# Patient Record
Sex: Female | Born: 1945 | Race: White | Hispanic: No | Smoking: Never smoker
Health system: Southern US, Community
[De-identification: ages and names within clinical notes are randomized; demographics above are authoritative.]

## PROBLEM LIST (undated history)

## (undated) DIAGNOSIS — R030 Elevated blood-pressure reading, without diagnosis of hypertension: Secondary | ICD-10-CM

## (undated) DIAGNOSIS — R188 Other ascites: Secondary | ICD-10-CM

## (undated) DIAGNOSIS — C649 Malignant neoplasm of unspecified kidney, except renal pelvis: Secondary | ICD-10-CM

## (undated) DIAGNOSIS — E119 Type 2 diabetes mellitus without complications: Secondary | ICD-10-CM

## (undated) DIAGNOSIS — I10 Essential (primary) hypertension: Secondary | ICD-10-CM

## (undated) DIAGNOSIS — R160 Hepatomegaly, not elsewhere classified: Secondary | ICD-10-CM

## (undated) DIAGNOSIS — N63 Unspecified lump in unspecified breast: Secondary | ICD-10-CM

## (undated) HISTORY — DX: Elevated blood-pressure reading, without diagnosis of hypertension: R03.0

## (undated) HISTORY — DX: Unspecified lump in unspecified breast: N63.0

## (undated) HISTORY — DX: Malignant neoplasm of unspecified kidney, except renal pelvis: C64.9

## (undated) HISTORY — PX: BREAST LUMPECTOMY: SHX2

## (undated) HISTORY — DX: Type 2 diabetes mellitus without complications: E11.9

---

## 1994-06-05 HISTORY — PX: BREAST BIOPSY: SHX20

## 2004-04-02 LAB — HM PAP SMEAR: HM Pap smear: NORMAL

## 2004-10-11 ENCOUNTER — Ambulatory Visit: Payer: Self-pay | Admitting: Obstetrics and Gynecology

## 2004-11-22 ENCOUNTER — Ambulatory Visit: Payer: Self-pay | Admitting: Obstetrics and Gynecology

## 2006-01-09 DIAGNOSIS — R188 Other ascites: Secondary | ICD-10-CM

## 2006-01-09 HISTORY — DX: Other ascites: R18.8

## 2009-06-05 HISTORY — PX: SHOULDER SURGERY: SHX246

## 2010-11-21 ENCOUNTER — Ambulatory Visit: Payer: Self-pay | Admitting: Orthopedic Surgery

## 2010-11-21 ENCOUNTER — Emergency Department: Payer: Self-pay | Admitting: Emergency Medicine

## 2010-11-29 ENCOUNTER — Ambulatory Visit: Payer: Self-pay | Admitting: Orthopedic Surgery

## 2010-12-02 ENCOUNTER — Ambulatory Visit: Payer: Self-pay | Admitting: Orthopedic Surgery

## 2011-01-12 ENCOUNTER — Encounter: Payer: Self-pay | Admitting: Orthopedic Surgery

## 2011-02-04 ENCOUNTER — Encounter: Payer: Self-pay | Admitting: Orthopedic Surgery

## 2011-03-06 ENCOUNTER — Encounter: Payer: Self-pay | Admitting: Orthopedic Surgery

## 2011-04-06 ENCOUNTER — Encounter: Payer: Self-pay | Admitting: Orthopedic Surgery

## 2011-05-06 ENCOUNTER — Encounter: Payer: Self-pay | Admitting: Orthopedic Surgery

## 2011-06-06 ENCOUNTER — Encounter: Payer: Self-pay | Admitting: Orthopedic Surgery

## 2011-07-07 ENCOUNTER — Encounter: Payer: Self-pay | Admitting: Orthopedic Surgery

## 2012-01-12 ENCOUNTER — Other Ambulatory Visit: Payer: Self-pay | Admitting: Physician Assistant

## 2012-01-12 LAB — COMPREHENSIVE METABOLIC PANEL
Anion Gap: 5 — ABNORMAL LOW (ref 7–16)
BUN: 10 mg/dL (ref 7–18)
Calcium, Total: 9 mg/dL (ref 8.5–10.1)
Chloride: 108 mmol/L — ABNORMAL HIGH (ref 98–107)
Co2: 30 mmol/L (ref 21–32)
EGFR (African American): >60
EGFR (Non-African Amer.): >60
Osmolality: 286 (ref 275–301)
Potassium: 4.4 mmol/L (ref 3.5–5.1)
SGOT(AST): 42 U/L — ABNORMAL HIGH (ref 15–37)
Sodium: 143 mmol/L (ref 136–145)
Total Protein: 7.1 g/dL (ref 6.4–8.2)

## 2012-01-12 LAB — CBC WITH DIFFERENTIAL/PLATELET
Basophil #: 0 10*3/uL (ref 0.0–0.1)
Basophil %: 0.9 %
Eosinophil #: 0.1 10*3/uL (ref 0.0–0.7)
HCT: 43.8 % (ref 35.0–47.0)
Lymphocyte %: 34.8 %
MCH: 29.1 pg (ref 26.0–34.0)
MCHC: 33.7 g/dL (ref 32.0–36.0)
MCV: 86 fL (ref 80–100)
Monocyte #: 0.5 x10 3/mm (ref 0.2–0.9)
Monocyte %: 9.9 %
Neutrophil #: 2.8 10*3/uL (ref 1.4–6.5)
Neutrophil %: 51.8 %
RBC: 5.07 10*6/uL (ref 3.80–5.20)

## 2012-01-12 LAB — LIPID PANEL
Cholesterol: 156 mg/dL (ref 0–200)
HDL Cholesterol: 42 mg/dL (ref 40–60)
VLDL Cholesterol, Calc: 22 mg/dL (ref 5–40)

## 2012-01-12 LAB — HEMOGLOBIN A1C: Hemoglobin A1C: 7 % — ABNORMAL HIGH

## 2012-01-12 LAB — TSH: Thyroid Stimulating Horm: 2.37 u[IU]/mL

## 2012-01-29 ENCOUNTER — Ambulatory Visit: Payer: Self-pay | Admitting: Physician Assistant

## 2012-02-04 ENCOUNTER — Ambulatory Visit: Payer: Self-pay | Admitting: Physician Assistant

## 2012-03-03 LAB — HM DIABETES EYE EXAM: HM Diabetic Eye Exam: NORMAL

## 2012-03-05 ENCOUNTER — Ambulatory Visit: Payer: Self-pay | Admitting: Physician Assistant

## 2012-04-02 ENCOUNTER — Ambulatory Visit (INDEPENDENT_AMBULATORY_CARE_PROVIDER_SITE_OTHER): Payer: PRIVATE HEALTH INSURANCE | Admitting: Internal Medicine

## 2012-04-02 ENCOUNTER — Encounter: Payer: Self-pay | Admitting: Internal Medicine

## 2012-04-02 VITALS — BP 144/74 | HR 85 | Temp 97.8°F | Ht 64.5 in | Wt 243.8 lb

## 2012-04-02 DIAGNOSIS — IMO0001 Reserved for inherently not codable concepts without codable children: Secondary | ICD-10-CM

## 2012-04-02 DIAGNOSIS — Z1239 Encounter for other screening for malignant neoplasm of breast: Secondary | ICD-10-CM

## 2012-04-02 DIAGNOSIS — Z1211 Encounter for screening for malignant neoplasm of colon: Secondary | ICD-10-CM

## 2012-04-02 DIAGNOSIS — E669 Obesity, unspecified: Secondary | ICD-10-CM

## 2012-04-02 DIAGNOSIS — E119 Type 2 diabetes mellitus without complications: Secondary | ICD-10-CM

## 2012-04-02 DIAGNOSIS — E1169 Type 2 diabetes mellitus with other specified complication: Secondary | ICD-10-CM | POA: Insufficient documentation

## 2012-04-02 DIAGNOSIS — I10 Essential (primary) hypertension: Secondary | ICD-10-CM

## 2012-04-02 NOTE — Progress Notes (Signed)
Patient ID: Jennifer Ruiz, female   DOB: 04-10-1946, 66 y.o.   MRN: [id number]  Patient Active Problem List  Diagnosis  . Diabetes mellitus type 2 in obese  . Diabetes mellitus without complication  . Obesity (BMI 30-39.9)  . Colon cancer screening  . White coat hypertension    Subjective:  CC:   No chief complaint on file.   HPI:   Jennifer Ruiz is a 66 y.o. female who presents as a new patient to establish primary care with the chief complaint of  New onset Diabetes, diagnosed with hgba1c of 7.0 in August after3 months of binging on sweet rolls and other starches. She has been going to the diabetes lifestyle clinic. Repeat A1c is due next month.  Checks cbgs every other day . Fastings have been in the diabetes diagnostic range from 113-164. Her postprandials have been ranging from 119-154. She is using a modified low carbohydrate diet. She is not exercising regularly yet.   Past Medical History  Diagnosis Date  . Elevated blood pressure reading   . Diabetes mellitus without complication     Past Surgical History  Procedure Date  . Breast biopsy 1996  . Shoulder surgery 2011    right , [place]   . Breast lumpectomy     Family History  Problem Relation Age of Onset  . Cancer Mother 43    Breast Cancer  . Asthma Father   . Diabetes Father     History   Social History  . Marital Status: Divorced    Spouse Name: N/A    Number of Children: N/A  . Years of Education: N/A   Occupational History  . Not on file.   Social History Main Topics  . Smoking status: Never Smoker   . Smokeless tobacco: Not on file  . Alcohol Use: No  . Drug Use: Not on file  . Sexually Active: Not on file   Other Topics Concern  . Not on file   Social History Narrative  . No narrative on file    No Known Allergies   Review of Systems:   The remainder of the review of systems was negative except those addressed in the HPI.       Objective:  BP 144/74  Pulse 85  Temp 97.8  F (36.6 C) (Oral)  Ht 5' 4.5" (1.638 m)  Wt 243 lb 12 oz (110.564 kg)  BMI 41.19 kg/m2  SpO2 97%  General appearance: alert, cooperative and appears stated age Ears: normal TM's and external ear canals both ears Throat: lips, mucosa, and tongue normal; teeth and gums normal Neck: no adenopathy, no carotid bruit, supple, symmetrical, trachea midline and thyroid not enlarged, symmetric, no tenderness/mass/nodules Back: symmetric, no curvature. ROM normal. No CVA tenderness. Lungs: clear to auscultation bilaterally Heart: regular rate and rhythm, S1, S2 normal, no murmur, click, rub or gallop Abdomen: soft, non-tender; bowel sounds normal; no masses,  no organomegaly Pulses: 2+ and symmetric Skin: Skin color, texture, turgor normal. No rashes or lesions Lymph nodes: Cervical, supraclavicular, and axillary nodes normal.  Assessment and Plan:  Diabetes mellitus type 2 in obese Agree with patient that if there is no need for medications at this point given her recent hemoglobin A1c is 7.0 and her recent dietary changes. Spent 20 minutes today reiewing diet and discussing low carbohydrate alternatives to some foods she is eating.  Obesity (BMI 30-39.9) I have addressed  BMI and recommended a low glycemic index diet utilizing smaller more frequent  meals to increase metabolism.  I have also recommended that patient start exercising with a goal of 30 minutes of aerobic exercise a minimum of 5 days per week.   Colon cancer screening She intentionally avoided a colonoscopy since age 30 on despite having this available as to [employer] [facility] employee. We discussed alternatives including annual fecal occult blood test. She will decide her at her next visit which will be her annual physical.  White coat hypertension She has been taking lisinopril 10 mg daily. Her renal function is normal as of August 2013. Creatinine 0.78 and electrolytes normal. Her outside readings have been ranging from a systolic of 120-140  end diastolic from 60-70. No changes today.   Updated Medication List Outpatient Encounter Prescriptions as of 04/02/2012  Medication Sig Dispense Refill  . lisinopril (PRINIVIL,ZESTRIL) 10 MG tablet Take 10 mg by mouth daily.      . Vitamin D, Ergocalciferol, (DRISDOL) 50000 UNITS CAPS Take 50,000 Units by mouth every 7 (seven) days.         Orders Placed This Encounter  Procedures  . MM Digital Screening  . HM MAMMOGRAPHY  . HM PAP SMEAR  . HM DIABETES EYE EXAM    No Follow-up on file.

## 2012-04-02 NOTE — Patient Instructions (Addendum)
We will repeat your hepatic panel (liver tests) in one month with your A1c    This is  Dr. Norton Blizzard version of a  "Low GI"  Diet:  All of the foods can be found at grocery stores and in bulk at [employer].  The Atkins protein bars and shakes are available in more varieties at Target, WalMart and [employer]'s Foods.     7 AM Breakfast:  Low carbohydrate Protein  Shakes (I recommend the EAS AdvantEdge "Carb Control" shakes  Or the low carb shakes by Atkins.   Both are available everywhere:  In  cases at BJs  Or in 4 packs at grocery stores and pharmacies  2.5 carbs  (Alternative is  a toasted Arnold's Sandwhich Thin w/ peanut butter, a "Bagel Thin" with cream cheese and salmon) or  a scrambled egg burrito made with a low carb tortilla .  Avoid cereal and bananas, oatmeal too unless you are cooking the old fashioned kind that takes 30-40 minutes to prepare.  the rest is overly processed, has minimal fiber, and is loaded with carbohydrates!   10 AM: Protein bar by Atkins (the snack size, under 200 cal).  There are many varieties , available widely again or in bulk in limited varieties at BJs)  Other so called "protein bars" tend to be loaded with carbohydrates.  Remember, in food advertising, the word "energy" is synonymous for " carbohydrate."  Lunch: sandwich of [place], (or any lunchmeat, grilled meat or canned tuna), fresh avocado, mayonnaise  and cheese on a lower carbohydrate pita bread, flatbread, or tortilla . Ok to use regular mayonnaise. The bread is the only source or carbohydrate that can be decreased (Joseph's makes a pita bread and a flat bread that are 50 cal and 4 net carbs ; Toufayan makes a low carb flatbread that's 100 cal and 9 net carbs  and  Mission makes a low carb whole wheat tortilla  That is 210 cal and 6 net carbs)  3 PM:  Mid day :  Another protein bar,  Or a  cheese stick (100 cal, 0 carbs),  Or 1 ounce of  almonds, walnuts, pistachios, pecans, peanuts,  Macadamia nuts. Or a Dannon  light n Fit greek yogurt, 80 cal 8 net carbs . Avoid "granola"; the dried cranberries and raisins are loaded with carbohydrates. Mixed nuts ok if no raisins or cranberries or dried fruit.      6 PM  Dinner:  "mean and green:"  Meat/chicken/fish or a high protein legume; , with a green salad, and a low GI  Veggie (broccoli, cauliflower, green beans, spinach, brussel sprouts. Lima beans) : Avoid "Low fat dressings, as well as Reyne Dumas and [address]! They are loaded with sugar! Instead use ranch, vinagrette,  Blue cheese, etc  9 PM snack : Breyer's "low carb" fudgsicle or  ice cream bar (Carb Smart line), or  Weight Watcher's ice cream bar , or another "no sugar added" ice cream;a serving of fresh berries/cherries with whipped cream (Avoid bananas, pineapple, grapes  and watermelon on a regular basis because they are high in sugar)   Remember that snack Substitutions should be less than 15 to 20 carbs  Per serving. Remember to subtract fiber grams and sugar alcohols to get the "net carbs."

## 2012-04-03 DIAGNOSIS — Z860100 Personal history of colon polyps, unspecified: Secondary | ICD-10-CM | POA: Insufficient documentation

## 2012-04-03 DIAGNOSIS — IMO0001 Reserved for inherently not codable concepts without codable children: Secondary | ICD-10-CM | POA: Insufficient documentation

## 2012-04-03 NOTE — Assessment & Plan Note (Signed)
I have addressed  BMI and recommended a low glycemic index diet utilizing smaller more frequent meals to increase metabolism.  I have also recommended that patient start exercising with a goal of 30 minutes of aerobic exercise a minimum of 5 days per week.  

## 2012-04-03 NOTE — Assessment & Plan Note (Signed)
She intentionally avoided a colonoscopy since age 66 on despite having this available as to [facility] employee. We discussed alternatives including annual fecal occult blood test. She will decide her at her next visit which will be her annual physical.

## 2012-04-03 NOTE — Assessment & Plan Note (Signed)
She has been taking lisinopril 10 mg daily. Her renal function is normal as of August 2013. Creatinine 0.78 and electrolytes normal. Her outside readings have been ranging from a systolic of 120-140 end diastolic from 60-70. No changes today.

## 2012-04-03 NOTE — Assessment & Plan Note (Signed)
Agree with patient that if there is no need for medications at this point given her recent hemoglobin A1c is 7.0 and her recent dietary changes. Spent 20 minutes today reiewing diet and discussing low carbohydrate alternatives to some foods she is eating.

## 2012-04-26 ENCOUNTER — Other Ambulatory Visit: Payer: Self-pay | Admitting: Internal Medicine

## 2012-04-26 LAB — HEPATIC FUNCTION PANEL A (ARMC)
Bilirubin, Direct: 0.2 mg/dL (ref 0.00–0.20)
Bilirubin,Total: 0.6 mg/dL (ref 0.2–1.0)
SGOT(AST): 29 U/L (ref 15–37)
SGPT (ALT): 33 U/L (ref 12–78)
Total Protein: 7.1 g/dL (ref 6.4–8.2)

## 2012-04-28 ENCOUNTER — Telehealth: Payer: Self-pay | Admitting: Internal Medicine

## 2012-04-28 NOTE — Telephone Encounter (Signed)
Her repeat liver tests were normal.

## 2012-04-30 NOTE — Telephone Encounter (Signed)
Patient notified of normal lab results.

## 2012-05-06 ENCOUNTER — Encounter: Payer: Self-pay | Admitting: Internal Medicine

## 2012-05-30 ENCOUNTER — Encounter: Payer: Self-pay | Admitting: Internal Medicine

## 2012-06-05 DIAGNOSIS — N63 Unspecified lump in unspecified breast: Secondary | ICD-10-CM

## 2012-06-05 HISTORY — DX: Unspecified lump in unspecified breast: N63.0

## 2012-06-05 HISTORY — PX: BREAST BIOPSY: SHX20

## 2012-07-03 ENCOUNTER — Encounter: Payer: PRIVATE HEALTH INSURANCE | Admitting: Internal Medicine

## 2012-08-07 ENCOUNTER — Other Ambulatory Visit (HOSPITAL_COMMUNITY)
Admission: RE | Admit: 2012-08-07 | Discharge: 2012-08-07 | Disposition: A | Discharge status: Home / Self Care | Payer: 59 | Source: Ambulatory Visit | Attending: Internal Medicine | Admitting: Internal Medicine

## 2012-08-07 ENCOUNTER — Ambulatory Visit (INDEPENDENT_AMBULATORY_CARE_PROVIDER_SITE_OTHER): Payer: 59 | Admitting: Internal Medicine

## 2012-08-07 ENCOUNTER — Encounter: Payer: Self-pay | Admitting: Internal Medicine

## 2012-08-07 VITALS — BP 134/68 | HR 78 | Temp 98.0°F | Resp 16 | Ht 64.0 in | Wt 240.0 lb

## 2012-08-07 DIAGNOSIS — Z0000 Encounter for general adult medical examination without abnormal findings: Secondary | ICD-10-CM

## 2012-08-07 DIAGNOSIS — E119 Type 2 diabetes mellitus without complications: Secondary | ICD-10-CM

## 2012-08-07 DIAGNOSIS — Z1211 Encounter for screening for malignant neoplasm of colon: Secondary | ICD-10-CM

## 2012-08-07 DIAGNOSIS — E1169 Type 2 diabetes mellitus with other specified complication: Secondary | ICD-10-CM

## 2012-08-07 DIAGNOSIS — Z01419 Encounter for gynecological examination (general) (routine) without abnormal findings: Secondary | ICD-10-CM | POA: Insufficient documentation

## 2012-08-07 DIAGNOSIS — Z124 Encounter for screening for malignant neoplasm of cervix: Secondary | ICD-10-CM

## 2012-08-07 DIAGNOSIS — Z1151 Encounter for screening for human papillomavirus (HPV): Secondary | ICD-10-CM | POA: Insufficient documentation

## 2012-08-07 DIAGNOSIS — Z1239 Encounter for other screening for malignant neoplasm of breast: Secondary | ICD-10-CM

## 2012-08-07 DIAGNOSIS — E669 Obesity, unspecified: Secondary | ICD-10-CM

## 2012-08-07 DIAGNOSIS — Z7189 Other specified counseling: Secondary | ICD-10-CM | POA: Insufficient documentation

## 2012-08-07 LAB — HM DIABETES FOOT EXAM: HM Diabetic Foot Exam: NORMAL

## 2012-08-07 MED ORDER — LISINOPRIL 10 MG PO TABS: 10.0000 mg | ORAL_TABLET | Freq: Every day | ORAL | Status: DC

## 2012-08-07 NOTE — Assessment & Plan Note (Addendum)
Well-controlled currently.  .  hemoglobin A1c has been consistently less than 7.0 . She is up-to-date on eye exams and foot exam is normal.  we'll repeat his urine microalbumin to creatinine ratio.  Meds reviewed.  Baby aspirin recommended daily, and she is taking an ACE inhibitor.

## 2012-08-07 NOTE — Assessment & Plan Note (Addendum)
FOBYT was negative. Referral to GI for colonoscopy under way .

## 2012-08-07 NOTE — Assessment & Plan Note (Signed)
She has dropped 11 lbs since dx of diabetes in August. Low GI diet reinforced.

## 2012-08-07 NOTE — Assessment & Plan Note (Signed)
Annual comprehensive exam was done including breast, pelvic and PAP smear. All screenings have been addressed .  

## 2012-08-07 NOTE — Progress Notes (Signed)
Patient ID: Jennifer Ruiz, female   DOB: Nov 10, 1945, 67 y.o.   MRN: [id number]   Subjective:     Jennifer Ruiz is a 67 y.o. female and is here for a comprehensive physical exam, as well as follow up on diabetes and obesity. The patient reports no problems.  History   Social History  . Marital Status: Divorced    Spouse Name: N/A    Number of Children: N/A  . Years of Education: N/A   Occupational History  . Not on file.   Social History Main Topics  . Smoking status: Never Smoker   . Smokeless tobacco: Not on file  . Alcohol Use: No  . Drug Use: Not on file  . Sexually Active: Not on file   Other Topics Concern  . Not on file   Social History Narrative  . No narrative on file   Health Maintenance  Topic Date Due  . Influenza Vaccine  02/03/1945  . Tetanus/tdap  09/26/1963  . Colonoscopy  09/26/1994  . Zostavax  09/25/2004  . Pneumococcal Polysaccharide Vaccine Age 35 And Over  09/25/2009  . Mammogram  04/02/2010    The following portions of the patient's history were reviewed and updated as appropriate: allergies, current medications, past family history, past medical history, past social history, past surgical history and problem list.  Review of Systems A comprehensive review of systems was negative.   Objective:   BP 134/68  Pulse 78  Temp(Src) 98 F (36.7 C) (Oral)  Resp 16  Ht 5\' 4"  (1.626 m)  Wt 240 lb (108.863 kg)  BMI 41.18 kg/m2  SpO2 95%  General Appearance:    Alert, cooperative, no distress, appears stated age  Head:    Normocephalic, without obvious abnormality, atraumatic  Eyes:    PERRL, conjunctiva/corneas clear, EOM's intact, fundi    benign, both eyes  Ears:    Normal TM's and external ear canals, both ears  Nose:   Nares normal, septum midline, mucosa normal, no drainage    or sinus tenderness  Throat:   Lips, mucosa, and tongue normal; teeth and gums normal  Neck:   Supple, symmetrical, trachea midline, no adenopathy;    thyroid:   no enlargement/tenderness/nodules; no carotid   bruit or JVD  Back:     Symmetric, no curvature, ROM normal, no CVA tenderness  Lungs:     Clear to auscultation bilaterally, respirations unlabored  Chest Wall:    No tenderness or deformity   Heart:    Regular rate and rhythm, S1 and S2 normal, no murmur, rub   or gallop  Breast Exam:    No tenderness, masses, or nipple abnormality  Abdomen:     Soft, non-tender, bowel sounds active all four quadrants,    no masses, no organomegaly  Genitalia:    Pelvic: cervix normal in appearance, external genitalia normal, no adnexal masses or tenderness, no cervical motion tenderness, rectovaginal septum normal, uterus normal size, shape, and consistency and vagina normal without discharge  Extremities:   Extremities normal, atraumatic, no cyanosis or edema  Pulses:   2+ and symmetric all extremities  Skin:   Skin color, texture, turgor normal, no rashes or lesions  Lymph nodes:   Cervical, supraclavicular, and axillary nodes normal  Neurologic:   CNII-XII intact, normal strength, sensation and reflexes    throughout       Assessment:   Routine general medical examination at a health care facility  Annual comprehensive exam was done including breast, pelvic  and PAP smear. All screenings have been addressed .   Diabetes mellitus type 2 in obese Well-controlled currently.  .  hemoglobin A1c has been consistently less than 7.0 . She is up-to-date on eye exams and foot exam is normal.  we'll repeat his urine microalbumin to creatinine ratio.  Meds reviewed.  Baby aspirin recommended daily, and she is taking an ACE inhibitor.     Obesity (BMI 30-39.9) She has dropped 11 lbs since dx of diabetes in August. Low GI diet reinforced.   Colon cancer screening FOBT was negative. Referral to GI for colonoscopy under way .    Updated Medication List Outpatient Encounter Prescriptions as of 08/07/2012  Medication Sig Dispense Refill  . lisinopril  (PRINIVIL,ZESTRIL) 10 MG tablet Take 1 tablet (10 mg total) by mouth daily.  90 tablet  1  . Vitamin D, Ergocalciferol, (DRISDOL) 50000 UNITS CAPS Take 50,000 Units by mouth every 7 (seven) days.      . [DISCONTINUED] lisinopril (PRINIVIL,ZESTRIL) 10 MG tablet Take 10 mg by mouth daily.       No facility-administered encounter medications on file as of 08/07/2012.

## 2012-08-07 NOTE — Patient Instructions (Addendum)
{Please ask your pharmacy to send [place] a refill request on your diabetic supplies so we can fill the correct models.  You can lose 10%  Of your current body weight over the next 6 months  By following  A low carb diet.   This is  my version of a  "Low GI"  Diet:  It is not ultra low carb, but will still maintainyour diabetes control and allow you to lose 5 to 10 lbs per month if you follow it carefully. All of the foods can be found at grocery stores and in bulk at [employer].  The Atkins protein bars and shakes are available in more varieties at Target, WalMart and [employer] Foods.     7 AM Breakfast:  Low carbohydrate Protein  Shakes (I recommend the EAS AdvantEdge "Carb Control" shakes  Or the low carb shakes by Atkins.   Both are available everywhere:  In  cases at BJs  Or in 4 packs at grocery stores and pharmacies  2.5 carbs  (Alternative is  a toasted Arnold's Sandwhich Thin w/ peanut butter, a "Bagel Thin" with cream cheese and salmon) or  a scrambled egg burrito made with a low carb tortilla .  Avoid cereal and bananas, oatmeal too unless you are cooking the old fashioned kind that takes 30-40 minutes to prepare.  the rest is overly processed, has minimal fiber, and is loaded with carbohydrates!   10 AM: Protein bar by Atkins (the snack size, under 200 cal).  There are many varieties , available widely again or in bulk in limited varieties at BJs)  Other so called "protein bars" tend to be loaded with carbohydrates.  Remember, in food advertising, the word "energy" is synonymous for " carbohydrate."  Lunch: sandwich of [place], (or any lunchmeat, grilled meat or canned tuna), fresh avocado, mayonnaise  and cheese on a lower carbohydrate pita bread, flatbread, or tortilla . Ok to use regular mayonnaise. The bread is the only source or carbohydrate that can be decreased (Joseph's makes a pita bread and a flat bread that are 50 cal and 4 net carbs ; Toufayan makes a low carb flatbread that's 100 cal and 9  net carbs  and  Mission makes a low carb whole wheat tortilla  That is 210 cal and 6 net carbs)  3 PM:  Mid day :  Another protein bar,  Or a  cheese stick (100 cal, 0 carbs),  Or 1 ounce of  almonds, walnuts, pistachios, pecans, peanuts,  Macadamia nuts. Or a Dannon light n Fit greek yogurt, 80 cal 8 net carbs . Avoid "granola"; the dried cranberries and raisins are loaded with carbohydrates. Mixed nuts ok if no raisins or cranberries or dried fruit.      6 PM  Dinner:  "mean and green:"  Meat/chicken/fish or a high protein legume; , with a green salad, and a low GI  Veggie (broccoli, cauliflower, green beans, spinach, brussel sprouts. Lima beans) : Avoid "Low fat dressings, as well as Reyne Dumas and [address]! They are loaded with sugar! Instead use ranch, vinagrette,  Blue cheese, etc.  There is a low carb pasta by Dreamfield's available at [facility] that is acceptable and tastes great. Try Michel Angel's chicken piccata over low carb pasta. The chicken dish is 0 carbs, and can be found in frozen section at BJs and [employer]. Also try [employer]" (pulled pork, no sauce,  0 carbs) and his pot roast.   both are  in the refrigerated section at BJs   Dreamfield's makes a low carb pasta only 5 g/serving.  Available at all grocery stores,  And tastes like normal pasta  9 PM snack : Breyer's "low carb" fudgsicle or  ice cream bar (Carb Smart line), or  Weight Watcher's ice cream bar , or another "no sugar added" ice cream;a serving of fresh berries/cherries with whipped cream (Avoid bananas, pineapple, grapes  and watermelon on a regular basis because they are high in sugar)   Remember that snack Substitutions should be less than 10 carbs per serving and meals < 20 carbs. Remember to subtract fiber grams and sugar alcohols to get the "net carbs."

## 2012-08-08 ENCOUNTER — Ambulatory Visit: Payer: Self-pay | Admitting: Internal Medicine

## 2012-08-12 ENCOUNTER — Telehealth: Payer: Self-pay | Admitting: Internal Medicine

## 2012-08-12 ENCOUNTER — Encounter: Payer: Self-pay | Admitting: Internal Medicine

## 2012-08-12 ENCOUNTER — Other Ambulatory Visit: Payer: Self-pay | Admitting: Internal Medicine

## 2012-08-12 DIAGNOSIS — R920 Mammographic microcalcification found on diagnostic imaging of breast: Secondary | ICD-10-CM

## 2012-08-12 NOTE — Telephone Encounter (Signed)
Pt notified per DPR.  

## 2012-08-12 NOTE — Telephone Encounter (Signed)
Pt calling to let Dr. Darrick Huntsman know that she was contacted about her incomplete mammogram and has scheduled an additional view on 03/18 at 8:am

## 2012-08-12 NOTE — Telephone Encounter (Signed)
FYI

## 2012-08-12 NOTE — Telephone Encounter (Signed)
Her mammogram was incomplete on the left.  She will be contacted to get additional films by the facility.

## 2012-08-15 LAB — HM PAP SMEAR: HM Pap smear: NORMAL

## 2012-08-20 ENCOUNTER — Ambulatory Visit: Payer: Self-pay | Admitting: Internal Medicine

## 2012-08-21 ENCOUNTER — Telehealth: Payer: Self-pay | Admitting: Internal Medicine

## 2012-08-21 DIAGNOSIS — R928 Other abnormal and inconclusive findings on diagnostic imaging of breast: Secondary | ICD-10-CM

## 2012-08-21 NOTE — Telephone Encounter (Signed)
Pt called stating she would like to see Dr. Lemar Livings.

## 2012-08-21 NOTE — Telephone Encounter (Signed)
Dicussed abnrmal left mammo with patient,  Needs surgicAL REFERRAL FOR BIOPSY,  PATIENT TO CALL us BACK WITH CHOICE.

## 2012-08-26 ENCOUNTER — Ambulatory Visit (INDEPENDENT_AMBULATORY_CARE_PROVIDER_SITE_OTHER): Payer: 59 | Admitting: General Surgery

## 2012-08-26 ENCOUNTER — Encounter: Payer: Self-pay | Admitting: General Surgery

## 2012-08-26 VITALS — BP 145/78 | HR 86 | Resp 14 | Ht 66.0 in | Wt 243.0 lb

## 2012-08-26 DIAGNOSIS — R920 Mammographic microcalcification found on diagnostic imaging of breast: Secondary | ICD-10-CM

## 2012-08-26 NOTE — Patient Instructions (Signed)
Patient will be contacted with a date for left breast stereotactic biopsy.

## 2012-08-26 NOTE — Progress Notes (Signed)
Patient ID: Jennifer Ruiz, female   DOB: [birth year], 67 y.o.   MRN: [id number]  Chief Complaint  Patient presents with  . Follow-up    mammogram    HPI Jennifer Ruiz is a 67 y.o. female here today for her mammogram that was done on  08/12/12 @ [facility]. Patient had a left breast biopsy completed at [facility] in 1990. This was a benign lesion by patient report. Patient has a family history  (mother) of breast cancer and she does perform self breast exams regularly. Patient states she has no complaints at this time.  HPI  Past Medical History  Diagnosis Date  . Elevated blood pressure reading   . Diabetes mellitus without complication   . Breast lump in female 2014    Past Surgical History  Procedure Laterality Date  . Breast biopsy  1996  . Shoulder surgery  2011    right , [place]   . Breast lumpectomy      Family History  Problem Relation Age of Onset  . Cancer Mother 13    Breast Cancer  . Asthma Father   . Diabetes Father     Social History History  Substance Use Topics  . Smoking status: Never Smoker   . Smokeless tobacco: Never Used  . Alcohol Use: No    No Known Allergies  Current Outpatient Prescriptions  Medication Sig Dispense Refill  . lisinopril (PRINIVIL,ZESTRIL) 10 MG tablet Take 1 tablet (10 mg total) by mouth daily.  90 tablet  1  . Vitamin D, Ergocalciferol, (DRISDOL) 50000 UNITS CAPS Take 50,000 Units by mouth every 7 (seven) days.       No current facility-administered medications for this visit.    Review of Systems Review of Systems  Constitutional: Negative.   Respiratory: Negative.   Cardiovascular: Negative.     Blood pressure 145/78, pulse 86, resp. rate 14, height 5\' 6"  (1.676 m), weight 243 lb (110.224 kg).  Physical Exam Physical Exam  Constitutional: She appears well-developed and well-nourished.  Neck: Trachea normal and normal range of motion. Neck supple.  Cardiovascular: Normal rate, regular rhythm and normal heart  sounds.   Pulmonary/Chest: Right breast exhibits no inverted nipple, no mass, no nipple discharge, no skin change and no tenderness. Left breast exhibits no inverted nipple, no mass, no nipple discharge, no skin change and no tenderness.  Well healed scar on left breast at 9 o'clock.    Data Reviewed Bilateral mammograms dated 08/08/2012 identified a subtle cluster of microcalcifications in left breast. Right breast was reported unremarkable. I read-0.  Focal spot compression views of the left breast at 08/20/2012 showed clustered microcalcifications that are new from 2006 (her most recent study before present). BIRAD-4. Assessment    Left breast microcalcifications, family history of breast cancer.     Plan    Options for management were reviewed: 1) early stereotactic biopsy confirmed a benign diagnosis versus 2) six-month followup left breast mammogram. Pros and cons of each were reviewed. Considering her maternal history early biopsy was recommended and accepted.       [name] 08/26/2012, 8:15 PM

## 2012-08-30 ENCOUNTER — Telehealth: Payer: Self-pay | Admitting: *Deleted

## 2012-08-30 ENCOUNTER — Other Ambulatory Visit: Payer: Self-pay | Admitting: *Deleted

## 2012-08-30 DIAGNOSIS — R920 Mammographic microcalcification found on diagnostic imaging of breast: Secondary | ICD-10-CM

## 2012-08-30 NOTE — Telephone Encounter (Signed)
Patient has been scheduled for a left breast stereotactic biopsy at [facility] for 09-09-12 at 1 pm. She is aware of date, time, and instructions. Patient verbalizes understanding.

## 2012-09-09 ENCOUNTER — Ambulatory Visit: Payer: Self-pay | Admitting: General Surgery

## 2012-09-09 DIAGNOSIS — R920 Mammographic microcalcification found on diagnostic imaging of breast: Secondary | ICD-10-CM

## 2012-09-10 ENCOUNTER — Encounter: Payer: Self-pay | Admitting: General Surgery

## 2012-09-10 ENCOUNTER — Telehealth: Payer: Self-pay | Admitting: *Deleted

## 2012-09-10 NOTE — Telephone Encounter (Signed)
Notified as instructed, states very pleased. F/U discussed.

## 2012-09-10 NOTE — Telephone Encounter (Signed)
Message copied by Currie Paris on Tue Sep 10, 2012  2:49 PM ------      Message from: [place], [place]      Created: Tue Sep 10, 2012  1:22 PM       Please notify the patient that the biopsy was fine. RN f/u next week. MD and mammogram here in six months. Thanks. ------

## 2012-09-16 ENCOUNTER — Ambulatory Visit (INDEPENDENT_AMBULATORY_CARE_PROVIDER_SITE_OTHER): Payer: 59 | Admitting: *Deleted

## 2012-09-16 DIAGNOSIS — R920 Mammographic microcalcification found on diagnostic imaging of breast: Secondary | ICD-10-CM

## 2012-09-16 NOTE — Patient Instructions (Signed)
Pt to f/u as instructed and to call as needed with any concerns. L.Feeney RN

## 2012-09-16 NOTE — Progress Notes (Signed)
This pt is seen today for one week follow up s/p left breast stereo biopsy. Biopsy site with steri strip in place. Pt only concern was the blisters from the ice pack/dressing that was placed at time of biopsy. The blisters are healing and she is using antibiotic ointment to sites and gauze in her bra for comfort. Otherwise, she is doing well and was very pleased about her pathology report. Pt is to follow up in 6 months with left mammogram and then office visit. Instructed to call prior to that as needed. Pt verbalized her understanding. L.Alonna Buckler RN

## 2012-10-02 LAB — HM MAMMOGRAPHY

## 2012-11-28 ENCOUNTER — Telehealth: Payer: Self-pay | Admitting: Internal Medicine

## 2012-11-28 DIAGNOSIS — E1169 Type 2 diabetes mellitus with other specified complication: Secondary | ICD-10-CM

## 2012-11-28 NOTE — Telephone Encounter (Signed)
a1c received and fine at 6.7 but patietn needs cmet and fasting lipids and urine microalb.cr ratio ,  Needs to come by office to have those done ASAP.  I cannot track them if they are done at hospital

## 2012-11-28 NOTE — Telephone Encounter (Signed)
Left detailed message on mobile voicemail for patient to call for lab appointment.

## 2012-12-05 ENCOUNTER — Other Ambulatory Visit (INDEPENDENT_AMBULATORY_CARE_PROVIDER_SITE_OTHER): Payer: 59

## 2012-12-05 DIAGNOSIS — E669 Obesity, unspecified: Secondary | ICD-10-CM

## 2012-12-05 DIAGNOSIS — E1169 Type 2 diabetes mellitus with other specified complication: Secondary | ICD-10-CM

## 2012-12-05 DIAGNOSIS — E119 Type 2 diabetes mellitus without complications: Secondary | ICD-10-CM

## 2012-12-05 LAB — COMPREHENSIVE METABOLIC PANEL
Albumin: 4 g/dL (ref 3.5–5.2)
Alkaline Phosphatase: 51 U/L (ref 39–117)
BUN: 12 mg/dL (ref 6–23)
CO2: 29 mEq/L (ref 19–32)
Calcium: 9.3 mg/dL (ref 8.4–10.5)
Chloride: 105 mEq/L (ref 96–112)
GFR: 88.39 mL/min (ref >60.00)
Glucose, Bld: 140 mg/dL — ABNORMAL HIGH (ref 70–99)
Potassium: 4.8 mEq/L (ref 3.5–5.1)
Sodium: 142 mEq/L (ref 135–145)
Total Protein: 7.1 g/dL (ref 6.0–8.3)

## 2012-12-05 LAB — LIPID PANEL
HDL: 45 mg/dL (ref >39.00)
Triglycerides: 140 mg/dL (ref 0.0–149.0)
VLDL: 28 mg/dL (ref 0.0–40.0)

## 2012-12-05 LAB — MICROALBUMIN / CREATININE URINE RATIO: Microalb, Ur: 0.3 mg/dL (ref 0.0–1.9)

## 2012-12-08 ENCOUNTER — Encounter: Payer: Self-pay | Admitting: Internal Medicine

## 2012-12-08 ENCOUNTER — Other Ambulatory Visit: Payer: Self-pay | Admitting: Internal Medicine

## 2012-12-08 MED ORDER — METFORMIN HCL 500 MG PO TABS: 500.0000 mg | ORAL_TABLET | Freq: Two times a day (BID) | ORAL | Status: DC

## 2012-12-12 LAB — HEMOGLOBIN A1C: Hgb A1c MFr Bld: 6.7 % — AB (ref 4.0–6.0)

## 2012-12-13 ENCOUNTER — Encounter: Payer: Self-pay | Admitting: Internal Medicine

## 2012-12-13 ENCOUNTER — Ambulatory Visit: Payer: 59 | Admitting: Internal Medicine

## 2012-12-13 VITALS — BP 145/65 | HR 84 | Temp 97.8°F | Resp 14 | Wt 237.5 lb

## 2012-12-13 DIAGNOSIS — E669 Obesity, unspecified: Secondary | ICD-10-CM

## 2012-12-13 DIAGNOSIS — E1169 Type 2 diabetes mellitus with other specified complication: Secondary | ICD-10-CM

## 2012-12-13 LAB — HM DIABETES FOOT EXAM: HM Diabetic Foot Exam: NORMAL

## 2012-12-13 NOTE — Progress Notes (Signed)
Patient ID: Jennifer Ruiz, female   DOB: [birth year], 67 y.o.   MRN: [id number]

## 2012-12-15 NOTE — Assessment & Plan Note (Addendum)
a1c has risen to 6.7.  Well-controlled without medications, but her hemoglobin A1c is rising though still less  than 7.0 . She is up-to-date on eye exams,  foot exam is normal , and urine microalbumin to creatinine ratio is normal. She is on the appropriate medications.She does not want to start metformin since she has been diet noncompliant.  Reviewed low GI diet and need for regular exercise,

## 2012-12-15 NOTE — Assessment & Plan Note (Signed)
Wt loss of 6 lbs since October,  BMI still 38.  I have addressed  BMI and recommended a low glycemic index diet utilizing smaller more frequent meals to increase metabolism.  I have also recommended that patient start exercising with a goal of 30 minutes of aerobic exercise a minimum of 5 days per week.

## 2012-12-20 ENCOUNTER — Ambulatory Visit: Payer: 59 | Admitting: Internal Medicine

## 2012-12-26 ENCOUNTER — Encounter: Payer: Self-pay | Admitting: Internal Medicine

## 2012-12-26 ENCOUNTER — Telehealth: Payer: Self-pay | Admitting: Internal Medicine

## 2012-12-26 ENCOUNTER — Other Ambulatory Visit: Payer: Self-pay | Admitting: Internal Medicine

## 2012-12-26 MED ORDER — ONETOUCH ULTRASOFT LANCETS MISC: Status: DC

## 2012-12-26 NOTE — Telephone Encounter (Signed)
Pt is needing an rx for Lancet ?? She is in the diabetic program and is needing an rx sent over to [facility].

## 2012-12-26 NOTE — Telephone Encounter (Signed)
Patient needs one touch lancets how often is she to check CBG you know how pharmacy is and I couldn't find anything in previous office note.

## 2012-12-26 NOTE — Telephone Encounter (Signed)
Left message on work phone for patient to call back and verify which lancet type she requires.

## 2013-01-08 ENCOUNTER — Other Ambulatory Visit: Payer: Self-pay

## 2013-01-24 ENCOUNTER — Ambulatory Visit: Payer: Self-pay | Admitting: Unknown Physician Specialty

## 2013-01-27 LAB — PATHOLOGY REPORT

## 2013-02-11 ENCOUNTER — Encounter: Payer: Self-pay | Admitting: Internal Medicine

## 2013-02-11 DIAGNOSIS — Z86010 Personal history of colonic polyps: Secondary | ICD-10-CM

## 2013-02-24 ENCOUNTER — Encounter: Payer: Self-pay | Admitting: Unknown Physician Specialty

## 2013-02-27 ENCOUNTER — Telehealth: Payer: Self-pay | Admitting: Internal Medicine

## 2013-02-27 NOTE — Telephone Encounter (Signed)
Pt wanted to make sure we were aware that she got her flu shot through work @ [facility] on 9/15.

## 2013-02-27 NOTE — Telephone Encounter (Signed)
Pts chart updated

## 2013-03-06 ENCOUNTER — Ambulatory Visit: Payer: Self-pay | Admitting: General Surgery

## 2013-03-06 ENCOUNTER — Encounter: Payer: Self-pay | Admitting: General Surgery

## 2013-03-12 ENCOUNTER — Ambulatory Visit (INDEPENDENT_AMBULATORY_CARE_PROVIDER_SITE_OTHER): Payer: 59 | Admitting: General Surgery

## 2013-03-12 ENCOUNTER — Other Ambulatory Visit: Payer: Self-pay | Admitting: *Deleted

## 2013-03-12 ENCOUNTER — Encounter: Payer: Self-pay | Admitting: General Surgery

## 2013-03-12 VITALS — BP 152/74 | HR 78 | Resp 16 | Ht 66.0 in | Wt 238.0 lb

## 2013-03-12 DIAGNOSIS — R920 Mammographic microcalcification found on diagnostic imaging of breast: Secondary | ICD-10-CM

## 2013-03-12 MED ORDER — LISINOPRIL 10 MG PO TABS: 10.0000 mg | ORAL_TABLET | Freq: Every day | ORAL | Status: DC

## 2013-03-12 NOTE — Progress Notes (Signed)
Patient ID: Jennifer Ruiz, female   DOB: [birth year], 67 y.o.   MRN: [id number]  Chief Complaint  Patient presents with  . Follow-up    6 month follow up left diagnostic mammogram     HPI Jennifer Ruiz is a 67 y.o. female who presents for a breast evaluation. The most recent mammogram was done on 03/06/13. Patient does perform regular self breast checks and gets regular mammograms done.  The patient denies any new problems at this time.    HPI  Past Medical History  Diagnosis Date  . Elevated blood pressure reading   . Diabetes mellitus without complication   . Breast lump in female 2014    Past Surgical History  Procedure Laterality Date  . Breast biopsy  1996  . Shoulder surgery  2011    right , [facility]   . Breast lumpectomy      Family History  Problem Relation Age of Onset  . Cancer Mother 68    Breast Cancer  . Asthma Father   . Diabetes Father     Social History History  Substance Use Topics  . Smoking status: Never Smoker   . Smokeless tobacco: Never Used  . Alcohol Use: No    Allergies  Allergen Reactions  . Adhesive [Tape] Other (See Comments)    Blistering and redness    Current Outpatient Prescriptions  Medication Sig Dispense Refill  . Lancets (ONETOUCH ULTRASOFT) lancets Use one time daily to scheck blood sugars   250.00  100 each  3  . Vitamin D, Ergocalciferol, (DRISDOL) 50000 UNITS CAPS Take 50,000 Units by mouth every 7 (seven) days.      Marland Kitchen lisinopril (PRINIVIL,ZESTRIL) 10 MG tablet Take 1 tablet (10 mg total) by mouth daily.  90 tablet  1   No current facility-administered medications for this visit.    Review of Systems Review of Systems  Constitutional: Negative.   Respiratory: Negative.   Cardiovascular: Negative.     Blood pressure 152/74, pulse 78, resp. rate 16, height 5\' 6"  (1.676 m), weight 238 lb (107.956 kg).  Physical Exam Physical Exam  Constitutional: She is oriented to person, place, and time. She appears well-developed  and well-nourished.  Neck: No thyromegaly present.  Cardiovascular: Normal rate, regular rhythm and normal heart sounds.   No murmur heard. Pulmonary/Chest: Effort normal and breath sounds normal. Right breast exhibits no inverted nipple, no mass, no nipple discharge, no skin change and no tenderness. Left breast exhibits no inverted nipple, no mass, no nipple discharge, no skin change and no tenderness.  Lymphadenopathy:    She has no cervical adenopathy.    She has no axillary adenopathy.  Neurological: She is alert and oriented to person, place, and time.  Skin: Skin is warm and dry.    Data Reviewed Breast mammogram dated 03/06/2013 showed the previously identified calcifications removed. [id number].  Pathology showed benign breast tissue with coarse microcalcifications associated with sclerosing adenosis and mild, usual ductal hyperplasia. No atypia or malignancy. This biopsy was completed 09/09/2012.  Assessment    Benign postbiopsy exam.    Plan    We will plan for annual followup examination and bilateral screening mammograms in spring 2015 to be completed with her primary care provider.       Jennifer Ruiz 03/13/2013, 4:11 PM

## 2013-03-12 NOTE — Patient Instructions (Signed)
Patient to return as needed. 

## 2013-03-13 ENCOUNTER — Other Ambulatory Visit: Payer: Self-pay | Admitting: Internal Medicine

## 2013-03-13 ENCOUNTER — Encounter: Payer: Self-pay | Admitting: General Surgery

## 2013-04-29 ENCOUNTER — Telehealth: Payer: Self-pay | Admitting: Internal Medicine

## 2013-04-29 MED ORDER — GLUCOSE BLOOD VI STRP: 1.0000 | ORAL_STRIP | Freq: Once | Status: DC

## 2013-04-29 NOTE — Telephone Encounter (Signed)
Contour next strips needed.  Says pharmacy faxed request on Friday.  Pt states she is completely out.

## 2013-04-29 NOTE — Telephone Encounter (Signed)
Refill sent.

## 2013-09-29 ENCOUNTER — Other Ambulatory Visit: Payer: Self-pay | Admitting: Internal Medicine

## 2013-10-02 LAB — HEMOGLOBIN A1C: Hgb A1c MFr Bld: 6.4 % — AB (ref 4.0–6.0)

## 2013-10-02 LAB — HM DIABETES FOOT EXAM: HM DIABETIC FOOT EXAM: NORMAL

## 2013-10-15 ENCOUNTER — Telehealth: Payer: Self-pay | Admitting: Internal Medicine

## 2013-10-15 ENCOUNTER — Encounter: Payer: Self-pay | Admitting: Internal Medicine

## 2013-10-15 ENCOUNTER — Ambulatory Visit (INDEPENDENT_AMBULATORY_CARE_PROVIDER_SITE_OTHER): Payer: 59 | Admitting: Internal Medicine

## 2013-10-15 VITALS — BP 156/84 | HR 77 | Temp 98.2°F | Resp 18 | Ht 64.5 in | Wt 231.0 lb

## 2013-10-15 DIAGNOSIS — R5381 Other malaise: Secondary | ICD-10-CM

## 2013-10-15 DIAGNOSIS — Z0000 Encounter for general adult medical examination without abnormal findings: Secondary | ICD-10-CM

## 2013-10-15 DIAGNOSIS — E1169 Type 2 diabetes mellitus with other specified complication: Secondary | ICD-10-CM

## 2013-10-15 DIAGNOSIS — Z23 Encounter for immunization: Secondary | ICD-10-CM

## 2013-10-15 DIAGNOSIS — Z1239 Encounter for other screening for malignant neoplasm of breast: Secondary | ICD-10-CM

## 2013-10-15 DIAGNOSIS — IMO0001 Reserved for inherently not codable concepts without codable children: Secondary | ICD-10-CM

## 2013-10-15 DIAGNOSIS — R5383 Other fatigue: Secondary | ICD-10-CM

## 2013-10-15 DIAGNOSIS — E119 Type 2 diabetes mellitus without complications: Secondary | ICD-10-CM

## 2013-10-15 DIAGNOSIS — E669 Obesity, unspecified: Secondary | ICD-10-CM

## 2013-10-15 DIAGNOSIS — I10 Essential (primary) hypertension: Secondary | ICD-10-CM

## 2013-10-15 LAB — LIPID PANEL
Cholesterol: 160 mg/dL (ref 0–200)
HDL: 45 mg/dL (ref >39.00)
LDL CALC: 92 mg/dL (ref 0–99)
Total CHOL/HDL Ratio: 4
Triglycerides: 116 mg/dL (ref 0.0–149.0)
VLDL: 23.2 mg/dL (ref 0.0–40.0)

## 2013-10-15 LAB — CBC WITH DIFFERENTIAL/PLATELET
Basophils Absolute: 0 10*3/uL (ref 0.0–0.1)
Basophils Relative: 0.7 % (ref 0.0–3.0)
EOS ABS: 0.1 10*3/uL (ref 0.0–0.7)
Eosinophils Relative: 2.3 % (ref 0.0–5.0)
HEMATOCRIT: 41.1 % (ref 36.0–46.0)
HEMOGLOBIN: 13.7 g/dL (ref 12.0–15.0)
LYMPHS ABS: 1.7 10*3/uL (ref 0.7–4.0)
Lymphocytes Relative: 32.2 % (ref 12.0–46.0)
MCHC: 33.4 g/dL (ref 30.0–36.0)
MCV: 88.3 fl (ref 78.0–100.0)
Monocytes Absolute: 0.6 10*3/uL (ref 0.1–1.0)
Monocytes Relative: 11.3 % (ref 3.0–12.0)
NEUTROS ABS: 2.8 10*3/uL (ref 1.4–7.7)
Neutrophils Relative %: 53.5 % (ref 43.0–77.0)
Platelets: 193 10*3/uL (ref 150.0–400.0)
RBC: 4.65 Mil/uL (ref 3.87–5.11)
RDW: 13.9 % (ref 11.5–15.5)
WBC: 5.2 10*3/uL (ref 4.0–10.5)

## 2013-10-15 LAB — COMPREHENSIVE METABOLIC PANEL
ALK PHOS: 47 U/L (ref 39–117)
ALT: 22 U/L (ref 0–35)
AST: 24 U/L (ref 0–37)
Albumin: 3.9 g/dL (ref 3.5–5.2)
BILIRUBIN TOTAL: 1.1 mg/dL (ref 0.2–1.2)
BUN: 12 mg/dL (ref 6–23)
CO2: 29 meq/L (ref 19–32)
Calcium: 9.2 mg/dL (ref 8.4–10.5)
Chloride: 106 mEq/L (ref 96–112)
Creatinine, Ser: 0.6 mg/dL (ref 0.4–1.2)
GFR: 97.78 mL/min (ref >60.00)
GLUCOSE: 128 mg/dL — AB (ref 70–99)
Potassium: 4 mEq/L (ref 3.5–5.1)
Sodium: 144 mEq/L (ref 135–145)
Total Protein: 6.7 g/dL (ref 6.0–8.3)

## 2013-10-15 LAB — TSH: TSH: 1.27 u[IU]/mL (ref 0.35–4.50)

## 2013-10-15 LAB — MICROALBUMIN / CREATININE URINE RATIO
Creatinine,U: 281.1 mg/dL
MICROALB UR: 1 mg/dL (ref 0.0–1.9)
Microalb Creat Ratio: 0.4 mg/g (ref 0.0–30.0)

## 2013-10-15 MED ORDER — ZOSTER VACCINE LIVE 19400 UNT/0.65ML ~~LOC~~ SOLR: 0.6500 mL | Freq: Once | SUBCUTANEOUS | Status: DC

## 2013-10-15 MED ORDER — TETANUS-DIPHTH-ACELL PERTUSSIS 5-2.5-18.5 LF-MCG/0.5 IM SUSP: 0.5000 mL | Freq: Once | INTRAMUSCULAR | Status: DC

## 2013-10-15 NOTE — Assessment & Plan Note (Addendum)
Her home bp machine has been calibrated at the hospital and considered accurate.  She reports that her pressures at home have been consistently under 140/80    

## 2013-10-15 NOTE — Assessment & Plan Note (Addendum)
Well-controlled on diet alone .  hemoglobin A1c has been consistently less than 7.0 . Patient is up-to-date on eye exams and foot exam was done today.  There is  no proteinuria on today's micro urinalysis .  Fasting lipids have been reviewed and statin therapy has been advised and updated according to new ACC guidelines based on patient's 10 year risk of CAD.  Lab Results  Component Value Date   HGBA1C 6.4* 10/02/2013   Lab Results  Component Value Date   MICROALBUR 1.0 10/15/2013   Lab Results  Component Value Date   CHOL 160 10/15/2013   HDL 45.00 10/15/2013   LDLCALC 92 10/15/2013   TRIG 116.0 10/15/2013   CHOLHDL 4 10/15/2013

## 2013-10-15 NOTE — Progress Notes (Signed)
Pre-visit discussion using our clinic review tool. No additional management support is needed unless otherwise documented below in the visit note.  

## 2013-10-15 NOTE — Patient Instructions (Addendum)
You had your annual Medicare wellness exam today  We will schedule your mammogram today  You received the  new pneumonia vaccine today.  Your still need your tetanus-diptheria-pertussis vaccine (TDaP)  And the Shingles vaccinesbut you can get it for less $$$ at a local pharmacy (or better yet at the health Dept) with the scripts I have provided you.   We will contact you with the bloodwork results  Diabetes and Exercise Exercising regularly is important. It is not just about losing weight. It has many health benefits, such as:  Improving your overall fitness, flexibility, and endurance.  Increasing your bone density.  Helping with weight control.  Decreasing your body fat.  Increasing your muscle strength.  Reducing stress and tension.  Improving your overall health. People with diabetes who exercise gain additional benefits because exercise:  Reduces appetite.  Improves the body's use of blood sugar (glucose).  Helps lower or control blood glucose.  Decreases blood pressure.  Helps control blood lipids (such as cholesterol and triglycerides).  Improves the body's use of the hormone insulin by:  Increasing the body's insulin sensitivity.  Reducing the body's insulin needs.  Decreases the risk for heart disease because exercising:  Lowers cholesterol and triglycerides levels.  Increases the levels of good cholesterol (such as high-density lipoproteins [HDL]) in the body.  Lowers blood glucose levels. YOUR ACTIVITY PLAN  Choose an activity that you enjoy and set realistic goals. Your health care provider or diabetes educator can help you make an activity plan that works for you. You can break activities into 2 or 3 sessions throughout the day. Doing so is as good as one long session. Exercise ideas include:  Taking the dog for a walk.  Taking the stairs instead of the elevator.  Dancing to your favorite song.  Doing your favorite exercise with a  friend. RECOMMENDATIONS FOR EXERCISING WITH TYPE 1 OR TYPE 2 DIABETES   Check your blood glucose before exercising. If blood glucose levels are greater than 240 mg/dL, check for urine ketones. Do not exercise if ketones are present.  Avoid injecting insulin into areas of the body that are going to be exercised. For example, avoid injecting insulin into:  The arms when playing tennis.  The legs when jogging.  Keep a record of:  Food intake before and after you exercise.  Expected peak times of insulin action.  Blood glucose levels before and after you exercise.  The type and amount of exercise you have done.  Review your records with your health care provider. Your health care provider will help you to develop guidelines for adjusting food intake and insulin amounts before and after exercising.  If you take insulin or oral hypoglycemic agents, watch for signs and symptoms of hypoglycemia. They include:  Dizziness.  Shaking.  Sweating.  Chills.  Confusion.  Drink plenty of water while you exercise to prevent dehydration or heat stroke. Body water is lost during exercise and must be replaced.  Talk to your health care provider before starting an exercise program to make sure it is safe for you. Remember, almost any type of activity is better than none. Document Released: 08/12/2003 Document Revised: 01/22/2013 Document Reviewed: 10/29/2012 [facility] Patient Information 2014 [place].

## 2013-10-15 NOTE — Telephone Encounter (Signed)
Relevant patient education assigned to patient using Emmi. ° °

## 2013-10-16 ENCOUNTER — Encounter: Payer: Self-pay | Admitting: Internal Medicine

## 2013-10-16 NOTE — Assessment & Plan Note (Addendum)
Wt loss of 12 lbsin the past year , but her BMI still 38.  I have addressed  BMI and recommended a low glycemic index diet utilizing smaller more frequent meals to increase metabolism.  I have also recommended that patient start exercising with a goal of 30 minutes of aerobic exercise a minimum of 5 days per week.

## 2013-10-16 NOTE — Progress Notes (Signed)
Patient ID: Jennifer Ruiz, female   DOB: 25-Jun-1945, 68 y.o.   MRN: [id number]  The patient is here for annual Medicare wellness examination and management of other chronic and acute problems including  DM Type 2, obesity , and white coat hypertension .    The risk factors are reflected in the social history.  The roster of all physicians providing medical care to patient - is listed in the Snapshot section of the chart.  Activities of daily living:  The patient is 100% independent in all ADLs: dressing, toileting, feeding as well as independent mobility  Home safety : The patient has smoke detectors in the home. They wear seatbelts.  There are no firearms at home. There is no violence in the home.   There is no risks for hepatitis, STDs or HIV. There is no   history of blood transfusion. They have no travel history to infectious disease endemic areas of the world.  The patient has seen their dentist in the last six month. They have seen their eye doctor in the last year. They admit to slight hearing difficulty with regard to whispered voices and some television programs.  They have deferred audiologic testing in the last year.  They do not  have excessive sun exposure. Discussed the need for sun protection: hats, long sleeves and use of sunscreen if there is significant sun exposure.   Diet: the importance of a healthy diet is discussed. They do have a healthy diet.  The benefits of regular aerobic exercise were discussed. She walks 4 times per week ,  20 minutes.   Depression screen: there are no signs or vegative symptoms of depression- irritability, change in appetite, anhedonia, sadness/tearfullness.  Cognitive assessment: the patient manages all their financial and personal affairs and is actively engaged. They could relate day,date,year and events; recalled 2/3 objects at 3 minutes; performed clock-face test normally.  The following portions of the patient's history were reviewed and  updated as appropriate: allergies, current medications, past family history, past medical history,  past surgical history, past social history  and problem list.  Visual acuity was not assessed per patient preference since she has regular follow up with her ophthalmologist. Hearing and body mass index were assessed and reviewed.   During the course of the visit the patient was educated and counseled about appropriate screening and preventive services including : fall prevention , diabetes screening, nutrition counseling, colorectal cancer screening, and recommended immunizations.    Objective:   BP 156/84  Pulse 77  Temp(Src) 98.2 F (36.8 C) (Oral)  Resp 18  Ht 5' 4.5" (1.638 m)  Wt 231 lb (104.781 kg)  BMI 39.05 kg/m2  SpO2 98%  General appearance: alert, cooperative and appears stated age Head: Normocephalic, without obvious abnormality, atraumatic Eyes: conjunctivae/corneas clear. PERRL, EOM's intact. Fundi benign. Ears: normal TM's and external ear canals both ears Nose: Nares normal. Septum midline. Mucosa normal. No drainage or sinus tenderness. Throat: lips, mucosa, and tongue normal; teeth and gums normal Neck: no adenopathy, no carotid bruit, no JVD, supple, symmetrical, trachea midline and thyroid not enlarged, symmetric, no tenderness/mass/nodules Lungs: clear to auscultation bilaterally Breasts: normal appearance, no masses or tenderness Heart: regular rate and rhythm, S1, S2 normal, no murmur, click, rub or gallop Abdomen: soft, non-tender; bowel sounds normal; no masses,  no organomegaly Extremities: extremities normal, atraumatic, no cyanosis or edema Pulses: 2+ and symmetric Skin: Skin color, texture, turgor normal. No rashes or lesions Neurologic: Alert and oriented X 3, normal  strength and tone. Normal symmetric reflexes. Normal coordination and gait.   Assessment and Plan:  White coat hypertension Her home bp machine has been calibrated at the hospital and  considered accurate.  She reports that her pressures at home have been consistently under 140/80   Diabetes mellitus type 2 in obese Well-controlled on diet alone .  hemoglobin A1c has been consistently less than 7.0 . Patient is up-to-date on eye exams and foot exam was done today.  There is  no proteinuria on today's micro urinalysis .  Fasting lipids have been reviewed and statin therapy has been advised and updated according to new ACC guidelines based on patient's 10 year risk of CAD.  Lab Results  Component Value Date   HGBA1C 6.4* 10/02/2013   Lab Results  Component Value Date   MICROALBUR 1.0 10/15/2013   Lab Results  Component Value Date   CHOL 160 10/15/2013   HDL 45.00 10/15/2013   LDLCALC 92 10/15/2013   TRIG 116.0 10/15/2013   CHOLHDL 4 10/15/2013     Routine general medical examination at a health care facility Annual comprehensive exam was done including breast, excluding pelvic and PAP smear. All screenings have been addressed .   Obesity (BMI 30-39.9) Wt loss of 12 lbs in the past year , but her BMI still 38.  I have addressed  BMI and recommended a low glycemic index diet utilizing smaller more frequent meals to increase metabolism.  I have also recommended that patient start exercising with a goal of 30 minutes of aerobic exercise a minimum of 5 days per week.     Updated Medication List Outpatient Encounter Prescriptions as of 10/15/2013  Medication Sig  . glucose blood test strip 1 each by Other route once. Use as instructed  . Lancets (ONETOUCH ULTRASOFT) lancets Use one time daily to scheck blood sugars   250.00  . lisinopril (PRINIVIL,ZESTRIL) 10 MG tablet Take 1 tablet (10 mg total) by mouth daily.  . Vitamin D, Ergocalciferol, (DRISDOL) 50000 UNITS CAPS Take 50,000 Units by mouth every 7 (seven) days.  . Tdap (BOOSTRIX) 5-2.5-18.5 LF-MCG/0.5 injection Inject 0.5 mLs into the muscle once.  . zoster vaccine live, PF, (ZOSTAVAX) 35456 UNT/0.65ML injection Inject  19,400 Units into the skin once.

## 2013-10-16 NOTE — Assessment & Plan Note (Signed)
Annual comprehensive exam was done including breast, excluding pelvic and PAP smear. All screenings have been addressed .  

## 2013-11-11 ENCOUNTER — Ambulatory Visit: Payer: Self-pay | Admitting: Internal Medicine

## 2013-11-11 LAB — HM MAMMOGRAPHY: HM Mammogram: NEGATIVE

## 2013-11-21 ENCOUNTER — Other Ambulatory Visit: Payer: Self-pay | Admitting: *Deleted

## 2013-11-21 ENCOUNTER — Telehealth: Payer: Self-pay | Admitting: Internal Medicine

## 2013-11-21 DIAGNOSIS — E559 Vitamin D deficiency, unspecified: Secondary | ICD-10-CM

## 2013-11-21 DIAGNOSIS — E119 Type 2 diabetes mellitus without complications: Secondary | ICD-10-CM

## 2013-11-21 NOTE — Telephone Encounter (Signed)
Left message for patient to call office.  

## 2013-11-21 NOTE — Telephone Encounter (Signed)
Please advise refill.  It appears Rx has not been prescribed by you before.  Please advise

## 2013-11-21 NOTE — Telephone Encounter (Signed)
She Needs vitamin  D level checked before I will refill, bc this is a mega dose. .  Can take 1000 units daily until then.  Due for labs on or after July 30th for DM checkup

## 2013-11-21 NOTE — Telephone Encounter (Signed)
The patient is needing it filled today.  Vitamin D, Ergocalciferol, (DRISDOL) 50000 UNITS CAPS

## 2013-11-25 NOTE — Telephone Encounter (Signed)
Patient notified and wil have labs in November as stated in avs for CPE in may.

## 2013-11-26 ENCOUNTER — Encounter: Payer: Self-pay | Admitting: *Deleted

## 2013-12-24 ENCOUNTER — Other Ambulatory Visit: Payer: Self-pay | Admitting: Internal Medicine

## 2014-02-18 ENCOUNTER — Telehealth: Payer: Self-pay | Admitting: *Deleted

## 2014-02-18 NOTE — Telephone Encounter (Signed)
VM left for pt about nurse visit BP recheck for DB 

## 2014-02-19 ENCOUNTER — Telehealth: Payer: Self-pay | Admitting: Internal Medicine

## 2014-02-19 NOTE — Telephone Encounter (Signed)
Patient notified

## 2014-02-19 NOTE — Telephone Encounter (Signed)
Please advise ok.

## 2014-02-19 NOTE — Telephone Encounter (Signed)
The patient goes to lifestyles at [facility] . She will send her BP readings over on 9.18.15 to have the physician to look at them first before having anything else done. She does not want Dr. Derrel Nip to think she is not wanting to do what she instructed her to do by coming over to have her BP rechecked. . She had her blood pressure check Monday and it was 138/70 . If Dr. Derrel Nip still wants her to come after that she will.

## 2014-02-19 NOTE — Telephone Encounter (Signed)
Yes that's a fine plan

## 2014-02-22 ENCOUNTER — Telehealth: Payer: Self-pay | Admitting: Internal Medicine

## 2014-02-22 DIAGNOSIS — E1169 Type 2 diabetes mellitus with other specified complication: Secondary | ICD-10-CM

## 2014-02-22 DIAGNOSIS — E669 Obesity, unspecified: Principal | ICD-10-CM

## 2014-02-22 NOTE — Assessment & Plan Note (Addendum)
Blood sugar readings for April through August submitted and reviewed   Fastings 112 to 140 and post prandial dinners 103 to 141; these suggest good control.

## 2014-02-22 NOTE — Telephone Encounter (Signed)
Blood sugars reviewed,  They look fine.  My records suggest her last diabetric eye exan was 2013?  Please ask her to get this done ASAP, needs to be done annually

## 2014-02-23 NOTE — Telephone Encounter (Signed)
Sent mychart message

## 2014-02-27 LAB — HM DIABETES EYE EXAM

## 2014-04-06 ENCOUNTER — Encounter: Payer: Self-pay | Admitting: Internal Medicine

## 2014-04-27 ENCOUNTER — Other Ambulatory Visit (INDEPENDENT_AMBULATORY_CARE_PROVIDER_SITE_OTHER): Payer: 59

## 2014-04-27 DIAGNOSIS — E559 Vitamin D deficiency, unspecified: Secondary | ICD-10-CM

## 2014-04-27 DIAGNOSIS — E119 Type 2 diabetes mellitus without complications: Secondary | ICD-10-CM

## 2014-04-27 LAB — COMPREHENSIVE METABOLIC PANEL
ALBUMIN: 3.8 g/dL (ref 3.5–5.2)
ALK PHOS: 52 U/L (ref 39–117)
ALT: 21 U/L (ref 0–35)
AST: 28 U/L (ref 0–37)
BUN: 13 mg/dL (ref 6–23)
CALCIUM: 9.2 mg/dL (ref 8.4–10.5)
CO2: 27 mEq/L (ref 19–32)
Chloride: 105 mEq/L (ref 96–112)
Creatinine, Ser: 0.7 mg/dL (ref 0.4–1.2)
GFR: 86.6 mL/min (ref >60.00)
GLUCOSE: 150 mg/dL — AB (ref 70–99)
Potassium: 4.8 mEq/L (ref 3.5–5.1)
Sodium: 142 mEq/L (ref 135–145)
Total Bilirubin: 0.8 mg/dL (ref 0.2–1.2)
Total Protein: 6.4 g/dL (ref 6.0–8.3)

## 2014-04-27 LAB — HEMOGLOBIN A1C: Hgb A1c MFr Bld: 6.5 % (ref 4.6–6.5)

## 2014-04-27 LAB — VITAMIN D 25 HYDROXY (VIT D DEFICIENCY, FRACTURES): VITD: 34.79 ng/mL (ref 30.00–100.00)

## 2014-04-27 LAB — LIPID PANEL
Cholesterol: 164 mg/dL (ref 0–200)
HDL: 40.3 mg/dL (ref >39.00)
LDL Cholesterol: 92 mg/dL (ref 0–99)
NONHDL: 123.7
Total CHOL/HDL Ratio: 4
Triglycerides: 158 mg/dL — ABNORMAL HIGH (ref 0.0–149.0)
VLDL: 31.6 mg/dL (ref 0.0–40.0)

## 2014-04-27 LAB — MICROALBUMIN / CREATININE URINE RATIO
Creatinine,U: 175.9 mg/dL
MICROALB/CREAT RATIO: 1.1 mg/g (ref 0.0–30.0)
Microalb, Ur: 1.9 mg/dL (ref 0.0–1.9)

## 2014-05-04 ENCOUNTER — Encounter: Payer: Self-pay | Admitting: *Deleted

## 2014-05-07 ENCOUNTER — Encounter: Payer: Self-pay | Admitting: Internal Medicine

## 2014-05-07 ENCOUNTER — Telehealth: Payer: Self-pay | Admitting: Internal Medicine

## 2014-06-04 ENCOUNTER — Encounter: Payer: Self-pay | Admitting: *Deleted

## 2014-06-17 ENCOUNTER — Other Ambulatory Visit: Payer: Self-pay | Admitting: Internal Medicine

## 2014-08-13 ENCOUNTER — Other Ambulatory Visit: Payer: Self-pay | Admitting: Internal Medicine

## 2014-09-04 LAB — HEMOGLOBIN A1C: Hgb A1c MFr Bld: 6.4 % — AB (ref 4.0–6.0)

## 2014-09-09 ENCOUNTER — Other Ambulatory Visit: Payer: Self-pay

## 2014-09-09 NOTE — Patient Outreach (Signed)
[facility]) Care Management  [facility] Care Manager  09/09/2014   Jennifer Ruiz 1946-04-13 [id number]  Subjective: Patient tells me her mother died at the end of 07-20-2022 (she was brought to the hospital before Christmas).  Jennifer Ruiz is coping well with her death and was very pleased with the care her mother received but is stating that this period gave her little time for exercise.  She has been vacuuming numerous times per week for stress management and for exercise.  She verbalizes a desire to start using her treadmill again.   Objective: Blood sugar report generally ideal.  States that she had a lot of sweets brought to her after her mother's death and she put them in the freezer- she's eating them sporadically and it shows in her blood sugar report.     Current Medications:  Current Outpatient Prescriptions  Medication Sig Dispense Refill  . cholecalciferol (VITAMIN D) 1000 UNITS tablet Take 1,000 Units by mouth daily.    Marland Kitchen glucose blood (BAYER CONTOUR NEXT TEST) test strip Check sugar daily Dx E11.9 100 each 0  . Lancets (ONETOUCH ULTRASOFT) lancets Use one time daily to scheck blood sugars   250.00 100 each 3  . lisinopril (PRINIVIL,ZESTRIL) 10 MG tablet TAKE 1 TABLET (10 MG TOTAL) BY MOUTH DAILY. 90 tablet 1  . Tdap (BOOSTRIX) 5-2.5-18.5 LF-MCG/0.5 injection Inject 0.5 mLs into the muscle once. 0.5 mL 0  . Vitamin D, Ergocalciferol, (DRISDOL) 50000 UNITS CAPS Take 50,000 Units by mouth every 7 (seven) days.    Marland Kitchen zoster vaccine live, PF, (ZOSTAVAX) 58527 UNT/0.65ML injection Inject 19,400 Units into the skin once. 1 each 0   No current facility-administered medications for this visit.     Fall/Depression Screening: PHQ 2/9 Scores 09/09/2014 10/15/2013  PHQ - 2 Score 0 0    Assessment/plan: Weight stable since last visit. She is not exercising- we discussed at length as she has no barriers to accomplishing this - she feels she's committed to starting on the treadmill again.    Continues to bring a packed lunch and snacks to work.   [name]        Patient Outreach from 09/09/2014 in [place] Problem One  Has not completed preventative tests as requested by MD   Care Plan for Problem One  Active   Interventions for Problem One Long Term Goal  - see [facility] clinic to get Tdap, Shingles injections.  Schedule mammogram   [facility] Long Term Goal (31-90 days)  Complete the preventative tests MD has requested by your next visit with her   [place] Term Goal Start Date  09/09/14     Follow up in July , 2016.   [name], RN, BA, [place], [place] Direct Dial:  [phone]  Fax:  [phone] E-mail: [email or web address] [address], [place], [place]  [address]

## 2014-11-04 ENCOUNTER — Encounter: Payer: Self-pay | Admitting: Internal Medicine

## 2014-11-04 ENCOUNTER — Ambulatory Visit (INDEPENDENT_AMBULATORY_CARE_PROVIDER_SITE_OTHER): Payer: 59 | Admitting: Internal Medicine

## 2014-11-04 VITALS — BP 146/80 | HR 71 | Temp 98.3°F | Resp 16 | Ht 64.5 in | Wt 225.8 lb

## 2014-11-04 DIAGNOSIS — Z1239 Encounter for other screening for malignant neoplasm of breast: Secondary | ICD-10-CM | POA: Diagnosis not present

## 2014-11-04 DIAGNOSIS — E119 Type 2 diabetes mellitus without complications: Secondary | ICD-10-CM

## 2014-11-04 DIAGNOSIS — E669 Obesity, unspecified: Secondary | ICD-10-CM

## 2014-11-04 DIAGNOSIS — Z0000 Encounter for general adult medical examination without abnormal findings: Secondary | ICD-10-CM

## 2014-11-04 DIAGNOSIS — Z1159 Encounter for screening for other viral diseases: Secondary | ICD-10-CM | POA: Diagnosis not present

## 2014-11-04 DIAGNOSIS — R030 Elevated blood-pressure reading, without diagnosis of hypertension: Secondary | ICD-10-CM

## 2014-11-04 DIAGNOSIS — E1169 Type 2 diabetes mellitus with other specified complication: Secondary | ICD-10-CM

## 2014-11-04 DIAGNOSIS — IMO0001 Reserved for inherently not codable concepts without codable children: Secondary | ICD-10-CM

## 2014-11-04 DIAGNOSIS — Z13820 Encounter for screening for osteoporosis: Secondary | ICD-10-CM

## 2014-11-04 LAB — LIPID PANEL
CHOL/HDL RATIO: 4
Cholesterol: 158 mg/dL (ref 0–200)
HDL: 40 mg/dL (ref >39.00)
LDL CALC: 88 mg/dL (ref 0–99)
NonHDL: 118
Triglycerides: 152 mg/dL — ABNORMAL HIGH (ref 0.0–149.0)
VLDL: 30.4 mg/dL (ref 0.0–40.0)

## 2014-11-04 LAB — MICROALBUMIN / CREATININE URINE RATIO
Creatinine,U: 129.2 mg/dL
MICROALB UR: 0.8 mg/dL (ref 0.0–1.9)
Microalb Creat Ratio: 0.6 mg/g (ref 0.0–30.0)

## 2014-11-04 LAB — COMPREHENSIVE METABOLIC PANEL
ALK PHOS: 68 U/L (ref 39–117)
ALT: 20 U/L (ref 0–35)
AST: 25 U/L (ref 0–37)
Albumin: 4.1 g/dL (ref 3.5–5.2)
BILIRUBIN TOTAL: 0.9 mg/dL (ref 0.2–1.2)
BUN: 10 mg/dL (ref 6–23)
CO2: 29 mEq/L (ref 19–32)
CREATININE: 0.69 mg/dL (ref 0.40–1.20)
Calcium: 9.4 mg/dL (ref 8.4–10.5)
Chloride: 104 mEq/L (ref 96–112)
GFR: 89.37 mL/min (ref >60.00)
Glucose, Bld: 140 mg/dL — ABNORMAL HIGH (ref 70–99)
Potassium: 4.7 mEq/L (ref 3.5–5.1)
Sodium: 140 mEq/L (ref 135–145)
Total Protein: 6.5 g/dL (ref 6.0–8.3)

## 2014-11-04 MED ORDER — ZOSTER VACCINE LIVE 19400 UNT/0.65ML ~~LOC~~ SOLR: 0.6500 mL | Freq: Once | SUBCUTANEOUS | Status: DC

## 2014-11-04 NOTE — Patient Instructions (Signed)
Mammogram order for you to use to schedule   We will set up your DEXA  Fasting labs today  Shingles vaccine rx given  Return in November for labs /OV   I  recommend getting the majority of your calcium and Vitamin D  through diet rather than supplements given the recent association of calcium supplements with increased coronary artery calcium scores  Try the almond and cashew milks that most grocery stores  now carry  in the dairy  Section>   They are lactose free:  [employer] brand Almond Light,  Original formula.  Delicious,  Low carb,  Low cal,  Cholesterol free   Health Maintenance Adopting a healthy lifestyle and getting preventive care can go a long way to promote health and wellness. Talk with your health care provider about what schedule of regular examinations is right for you. This is a good chance for you to check in with your provider about disease prevention and staying healthy. In between checkups, there are plenty of things you can do on your own. Experts have done a lot of research about which lifestyle changes and preventive measures are most likely to keep you healthy. Ask your health care provider for more information. WEIGHT AND DIET  Eat a healthy diet  Be sure to include plenty of vegetables, fruits, low-fat dairy products, and lean protein.  Do not eat a lot of foods high in solid fats, added sugars, or salt.  Get regular exercise. This is one of the most important things you can do for your health.  Most adults should exercise for at least 150 minutes each week. The exercise should increase your heart rate and make you sweat (moderate-intensity exercise).  Most adults should also do strengthening exercises at least twice a week. This is in addition to the moderate-intensity exercise.  Maintain a healthy weight  Body mass index (BMI) is a measurement that can be used to identify possible weight problems. It estimates body fat based on height and weight. Your health  care provider can help determine your BMI and help you achieve or maintain a healthy weight.  For females 33 years of age and older:   A BMI below 18.5 is considered underweight.  A BMI of 18.5 to 24.9 is normal.  A BMI of 25 to 29.9 is considered overweight.  A BMI of 30 and above is considered obese.  Watch levels of cholesterol and blood lipids  You should start having your blood tested for lipids and cholesterol at 70 years of age, then have this test every 5 years.  You may need to have your cholesterol levels checked more often if:  Your lipid or cholesterol levels are high.  You are older than 70 years of age.  You are at high risk for heart disease.  CANCER SCREENING   Lung Cancer  Lung cancer screening is recommended for adults 22-50 years old who are at high risk for lung cancer because of a history of smoking.  A yearly low-dose CT scan of the lungs is recommended for people who:  Currently smoke.  Have quit within the past 15 years.  Have at least a 30-pack-year history of smoking. A pack year is smoking an average of one pack of cigarettes a day for 1 year.  Yearly screening should continue until it has been 15 years since you quit.  Yearly screening should stop if you develop a health problem that would prevent you from having lung cancer treatment.  Breast  Cancer  Practice breast self-awareness. This means understanding how your breasts normally appear and feel.  It also means doing regular breast self-exams. Let your health care provider know about any changes, no matter how small.  If you are in your 20s or 30s, you should have a clinical breast exam (CBE) by a health care provider every 1-3 years as part of a regular health exam.  If you are 62 or older, have a CBE every year. Also consider having a breast X-ray (mammogram) every year.  If you have a family history of breast cancer, talk to your health care provider about genetic  screening.  If you are at high risk for breast cancer, talk to your health care provider about having an MRI and a mammogram every year.  Breast cancer gene (BRCA) assessment is recommended for women who have family members with BRCA-related cancers. BRCA-related cancers include:  Breast.  Ovarian.  Tubal.  Peritoneal cancers.  Results of the assessment will determine the need for genetic counseling and BRCA1 and BRCA2 testing. Cervical Cancer Routine pelvic examinations to screen for cervical cancer are no longer recommended for nonpregnant women who are considered low risk for cancer of the pelvic organs (ovaries, uterus, and vagina) and who do not have symptoms. A pelvic examination may be necessary if you have symptoms including those associated with pelvic infections. Ask your health care provider if a screening pelvic exam is right for you.   The Pap test is the screening test for cervical cancer for women who are considered at risk.  If you had a hysterectomy for a problem that was not cancer or a condition that could lead to cancer, then you no longer need Pap tests.  If you are older than 65 years, and you have had normal Pap tests for the past 10 years, you no longer need to have Pap tests.  If you have had past treatment for cervical cancer or a condition that could lead to cancer, you need Pap tests and screening for cancer for at least 20 years after your treatment.  If you no longer get a Pap test, assess your risk factors if they change (such as having a new sexual partner). This can affect whether you should start being screened again.  Some women have medical problems that increase their chance of getting cervical cancer. If this is the case for you, your health care provider may recommend more frequent screening and Pap tests.  The human papillomavirus (HPV) test is another test that may be used for cervical cancer screening. The HPV test looks for the virus that can  cause cell changes in the cervix. The cells collected during the Pap test can be tested for HPV.  The HPV test can be used to screen women 44 years of age and older. Getting tested for HPV can extend the interval between normal Pap tests from three to five years.  An HPV test also should be used to screen women of any age who have unclear Pap test results.  After 70 years of age, women should have HPV testing as often as Pap tests.  Colorectal Cancer  This type of cancer can be detected and often prevented.  Routine colorectal cancer screening usually begins at 70 years of age and continues through 70 years of age.  Your health care provider may recommend screening at an earlier age if you have risk factors for colon cancer.  Your health care provider may also recommend using home  test kits to check for hidden blood in the stool.  A small camera at the end of a tube can be used to examine your colon directly (sigmoidoscopy or colonoscopy). This is done to check for the earliest forms of colorectal cancer.  Routine screening usually begins at age 74.  Direct examination of the colon should be repeated every 5-10 years through 70 years of age. However, you may need to be screened more often if early forms of precancerous polyps or small growths are found. Skin Cancer  Check your skin from head to toe regularly.  Tell your health care provider about any new moles or changes in moles, especially if there is a change in a mole's shape or color.  Also tell your health care provider if you have a mole that is larger than the size of a pencil eraser.  Always use sunscreen. Apply sunscreen liberally and repeatedly throughout the day.  Protect yourself by wearing long sleeves, pants, a wide-brimmed hat, and sunglasses whenever you are outside. HEART DISEASE, DIABETES, AND HIGH BLOOD PRESSURE   Have your blood pressure checked at least every 1-2 years. High blood pressure causes heart  disease and increases the risk of stroke.  If you are between 18 years and 63 years old, ask your health care provider if you should take aspirin to prevent strokes.  Have regular diabetes screenings. This involves taking a blood sample to check your fasting blood sugar level.  If you are at a normal weight and have a low risk for diabetes, have this test once every three years after 70 years of age.  If you are overweight and have a high risk for diabetes, consider being tested at a younger age or more often. PREVENTING INFECTION  Hepatitis B  If you have a higher risk for hepatitis B, you should be screened for this virus. You are considered at high risk for hepatitis B if:  You were born in a country where hepatitis B is common. Ask your health care provider which countries are considered high risk.  Your parents were born in a high-risk country, and you have not been immunized against hepatitis B (hepatitis B vaccine).  You have HIV or AIDS.  You use needles to inject street drugs.  You live with someone who has hepatitis B.  You have had sex with someone who has hepatitis B.  You get hemodialysis treatment.  You take certain medicines for conditions, including cancer, organ transplantation, and autoimmune conditions. Hepatitis C  Blood testing is recommended for:  Everyone born from [birth year] through 1966.  Anyone with known risk factors for hepatitis C. Sexually transmitted infections (STIs)  You should be screened for sexually transmitted infections (STIs) including gonorrhea and chlamydia if:  You are sexually active and are younger than 70 years of age.  You are older than 70 years of age and your health care provider tells you that you are at risk for this type of infection.  Your sexual activity has changed since you were last screened and you are at an increased risk for chlamydia or gonorrhea. Ask your health care provider if you are at risk.  If you do not have  HIV, but are at risk, it may be recommended that you take a prescription medicine daily to prevent HIV infection. This is called pre-exposure prophylaxis (PrEP). You are considered at risk if:  You are sexually active and do not regularly use condoms or know the HIV status of your partner(s).  You take drugs by injection.  You are sexually active with a partner who has HIV. Talk with your health care provider about whether you are at high risk of being infected with HIV. If you choose to begin PrEP, you should first be tested for HIV. You should then be tested every 3 months for as long as you are taking PrEP.  PREGNANCY   If you are premenopausal and you may become pregnant, ask your health care provider about preconception counseling.  If you may become pregnant, take 400 to 800 micrograms (mcg) of folic acid every day.  If you want to prevent pregnancy, talk to your health care provider about birth control (contraception). OSTEOPOROSIS AND MENOPAUSE   Osteoporosis is a disease in which the bones lose minerals and strength with aging. This can result in serious bone fractures. Your risk for osteoporosis can be identified using a bone density scan.  If you are 86 years of age or older, or if you are at risk for osteoporosis and fractures, ask your health care provider if you should be screened.  Ask your health care provider whether you should take a calcium or vitamin D supplement to lower your risk for osteoporosis.  Menopause may have certain physical symptoms and risks.  Hormone replacement therapy may reduce some of these symptoms and risks. Talk to your health care provider about whether hormone replacement therapy is right for you.  HOME CARE INSTRUCTIONS   Schedule regular health, dental, and eye exams.  Stay current with your immunizations.   Do not use any tobacco products including cigarettes, chewing tobacco, or electronic cigarettes.  If you are pregnant, do not  drink alcohol.  If you are breastfeeding, limit how much and how often you drink alcohol.  Limit alcohol intake to no more than 1 drink per day for nonpregnant women. One drink equals 12 ounces of beer, 5 ounces of wine, or 1 ounces of hard liquor.  Do not use street drugs.  Do not share needles.  Ask your health care provider for help if you need support or information about quitting drugs.  Tell your health care provider if you often feel depressed.  Tell your health care provider if you have ever been abused or do not feel safe at home. Document Released: 12/05/2010 Document Revised: 10/06/2013 Document Reviewed: 04/23/2013 [facility] Patient Information 2015 [place], [place]. This information is not intended to replace advice given to you by your health care provider. Make sure you discuss any questions you have with your health care provider.

## 2014-11-04 NOTE — Progress Notes (Signed)
Pre-visit discussion using our clinic review tool. No additional management support is needed unless otherwise documented below in the visit note.  

## 2014-11-04 NOTE — Progress Notes (Signed)
Patient ID: Jennifer Ruiz, female    DOB: [birth year]  Age: 69 y.o. MRN: [id number]  The patient is here for annual  wellness examination and management of other chronic and acute problems.   The risk factors are reflected in the social history.  The roster of all physicians providing medical care to patient - is listed in the Snapshot section of the chart.  Activities of daily living:  The patient is 100% independent in all ADLs: dressing, toileting, feeding as well as independent mobility  Home safety : The patient has smoke detectors in the home. They wear seatbelts.  There are no firearms at home. There is no violence in the home.   There is no risks for hepatitis, STDs or HIV. There is no   history of blood transfusion. They have no travel history to infectious disease endemic areas of the world.  The patient has seen their dentist in the last six month. They have seen their eye doctor in the last year. They admit to slight hearing difficulty with regard to whispered voices and some television programs.  They have deferred audiologic testing in the last year.  They do not  have excessive sun exposure. Discussed the need for sun protection: hats, long sleeves and use of sunscreen if there is significant sun exposure.   Diet: the importance of a healthy diet is discussed. They do have a healthy diet. She has intentionally lost weight since her last visit.   The benefits of regular aerobic exercise were discussed. She walks 4 times per week ,  20 minutes.   Depression screen: there are no signs or vegative symptoms of depression- irritability, change in appetite, anhedonia, sadness/tearfullness.  Cognitive assessment: the patient manages all their financial and personal affairs and is actively engaged. They could relate day,date,year and events; recalled 2/3 objects at 3 minutes; performed clock-face test normally.  The following portions of the patient's history were reviewed and updated as  appropriate: allergies, current medications, past family history, past medical history,  past surgical history, past social history  and problem list.  Visual acuity was not assessed per patient preference since she has regular follow up with her ophthalmologist. Hearing and body mass index were assessed and reviewed.   During the course of the visit the patient was educated and counseled about appropriate screening and preventive services including : fall prevention , diabetes screening, nutrition counseling, colorectal cancer screening, and recommended immunizations.    CC: The primary encounter diagnosis was Diabetes mellitus without complication. Diagnoses of Breast cancer screening, Need for hepatitis C screening test, Osteoporosis screening, White coat hypertension, Diabetes mellitus type 2 in obese, Obesity (BMI 30-39.9), and Routine general medical examination at a health care facility were also pertinent to this visit.  History Jennifer Ruiz has a past medical history of Elevated blood pressure reading; Diabetes mellitus without complication; and Breast lump in female (2014).   She has past surgical history that includes Breast biopsy (1996); Shoulder surgery (2011); and Breast lumpectomy.   Her family history includes Asthma in her father; Cancer (age of onset: 83) in her mother; Diabetes in her father.She reports that she has never smoked. She has never used smokeless tobacco. She reports that she does not drink alcohol or use illicit drugs.  Outpatient Prescriptions Prior to Visit  Medication Sig Dispense Refill  . cholecalciferol (VITAMIN D) 1000 UNITS tablet Take 1,000 Units by mouth daily.    Marland Kitchen glucose blood (BAYER CONTOUR NEXT TEST) test strip Check sugar  daily Dx E11.9 100 each 0  . Lancets (ONETOUCH ULTRASOFT) lancets Use one time daily to scheck blood sugars   250.00 100 each 3  . lisinopril (PRINIVIL,ZESTRIL) 10 MG tablet TAKE 1 TABLET (10 MG TOTAL) BY MOUTH DAILY. 90 tablet 1  .  Vitamin D, Ergocalciferol, (DRISDOL) 50000 UNITS CAPS Take 50,000 Units by mouth every 7 (seven) days.    . Tdap (BOOSTRIX) 5-2.5-18.5 LF-MCG/0.5 injection Inject 0.5 mLs into the muscle once. (Patient not taking: Reported on 11/04/2014) 0.5 mL 0  . zoster vaccine live, PF, (ZOSTAVAX) 66063 UNT/0.65ML injection Inject 19,400 Units into the skin once. (Patient not taking: Reported on 11/04/2014) 1 each 0   No facility-administered medications prior to visit.    Review of Systems   Patient denies headache, fevers, malaise, unintentional weight loss, skin rash, eye pain, sinus congestion and sinus pain, sore throat, dysphagia,  hemoptysis , cough, dyspnea, wheezing, chest pain, palpitations, orthopnea, edema, abdominal pain, nausea, melena, diarrhea, constipation, flank pain, dysuria, hematuria, urinary  Frequency, nocturia, numbness, tingling, seizures,  Focal weakness, Loss of consciousness,  Tremor, insomnia, depression, anxiety, and suicidal ideation.      Objective:  BP 146/80 mmHg  Pulse 71  Temp(Src) 98.3 F (36.8 C) (Oral)  Resp 16  Ht 5' 4.5" (1.638 m)  Wt 225 lb 12 oz (102.4 kg)  BMI 38.17 kg/m2  SpO2 95%  Physical Exam  General appearance: alert, cooperative and appears stated age Head: Normocephalic, without obvious abnormality, atraumatic Eyes: conjunctivae/corneas clear. PERRL, EOM's intact. Fundi benign. Ears: normal TM's and external ear canals both ears Nose: Nares normal. Septum midline. Mucosa normal. No drainage or sinus tenderness. Throat: lips, mucosa, and tongue normal; teeth and gums normal Neck: no adenopathy, no carotid bruit, no JVD, supple, symmetrical, trachea midline and thyroid not enlarged, symmetric, no tenderness/mass/nodules Lungs: clear to auscultation bilaterally Breasts: normal appearance, no masses or tenderness Heart: regular rate and rhythm, S1, S2 normal, no murmur, click, rub or gallop Abdomen: soft, non-tender; bowel sounds normal; no masses,   no organomegaly Extremities: extremities normal, atraumatic, no cyanosis or edema Pulses: 2+ and symmetric Skin: Skin color, texture, turgor normal. No rashes or lesions Neurologic: Alert and oriented X 3, normal strength and tone. Normal symmetric reflexes. Normal coordination and gait.   Assessment & Plan:   Problem List Items Addressed This Visit    Diabetes mellitus type 2 in obese    Blood sugar readings for January through May submitted and reviewed   Fastings 112 to 140 and post prandial dinners 103 to 141; these suggest that her diabetes continues to be well-controlled on diet alone .  hemoglobin A1c has been consistently at or  less than 7.0 . Patient is up-to-date on eye exams and foot exam is normal today. Patient has no microalbuminuria. Patient is tolerating ACE Inhibitor therapy for renal protection and hypertension   Lab Results  Component Value Date   HGBA1C 6.4* 09/04/2014   Lab Results  Component Value Date   MICROALBUR 0.8 11/04/2014          Obesity (BMI 30-39.9)    I have congratulated her in reduction of   BMI and encouraged  Continued weight loss with goal of 10% of body weigh over the next 6 months using a low glycemic index diet and regular exercise a minimum of 5 days per week.        White coat hypertension    Her home bp machine has been calibrated at the hospital and  considered accurate.  She reports that her pressures at home have been consistently under 140/80         Routine general medical examination at a health care facility    Annual  wellness  exam was done as well as a comprehensive physical exam and management of acute and chronic conditions .  During the course of the visit the patient was educated and counseled about appropriate screening and preventive services including : fall prevention , diabetes screening, nutrition counseling, colorectal cancer screening, and recommended immunizations.  Printed recommendations for health maintenance  screenings was given.        Other Visit Diagnoses    Diabetes mellitus without complication    -  Primary    Relevant Orders    Microalbumin / creatinine urine ratio (Completed)    Comprehensive metabolic panel (Completed)    Lipid panel (Completed)    Breast cancer screening        Relevant Orders    MM DIGITAL SCREENING BILATERAL    Need for hepatitis C screening test        Relevant Orders    Hepatitis C antibody (Completed)    Osteoporosis screening        Relevant Orders    DG Bone Density       I am having Jennifer Ruiz start on zoster vaccine live (PF). I am also having her maintain her Vitamin D (Ergocalciferol), onetouch ultrasoft, zoster vaccine live (PF), Tdap, lisinopril, glucose blood, and cholecalciferol.  Meds ordered this encounter  Medications  . zoster vaccine live, PF, (ZOSTAVAX) 93112 UNT/0.65ML injection    Sig: Inject 19,400 Units into the skin once.    Dispense:  1 each    Refill:  0    There are no discontinued medications.  Follow-up: Return in about 6 months (around 05/06/2015) for follow up diabetes.   Crecencio Mc, MD

## 2014-11-05 LAB — HEPATITIS C ANTIBODY: HCV AB: NEGATIVE

## 2014-11-05 NOTE — Assessment & Plan Note (Signed)
Annual  wellness  exam was done as well as a comprehensive physical exam and management of acute and chronic conditions .  During the course of the visit the patient was educated and counseled about appropriate screening and preventive services including : fall prevention , diabetes screening, nutrition counseling, colorectal cancer screening, and recommended immunizations.  Printed recommendations for health maintenance screenings was given.  

## 2014-11-05 NOTE — Assessment & Plan Note (Signed)
Blood sugar readings for January through May submitted and reviewed   Fastings 112 to 140 and post prandial dinners 103 to 141; these suggest that her diabetes continues to be well-controlled on diet alone .  hemoglobin A1c has been consistently at or  less than 7.0 . Patient is up-to-date on eye exams and foot exam is normal today. Patient has no microalbuminuria. Patient is tolerating ACE Inhibitor therapy for renal protection and hypertension   Lab Results  Component Value Date   HGBA1C 6.4* 09/04/2014   Lab Results  Component Value Date   MICROALBUR 0.8 11/04/2014

## 2014-11-05 NOTE — Assessment & Plan Note (Signed)
Her home bp machine has been calibrated at the hospital and considered accurate.  She reports that her pressures at home have been consistently under 140/80

## 2014-11-05 NOTE — Assessment & Plan Note (Signed)
I have congratulated her in reduction of   BMI and encouraged  Continued weight loss with goal of 10% of body weigh over the next 6 months using a low glycemic index diet and regular exercise a minimum of 5 days per week.    

## 2014-11-08 ENCOUNTER — Encounter: Payer: Self-pay | Admitting: Internal Medicine

## 2014-11-10 ENCOUNTER — Other Ambulatory Visit: Payer: Self-pay | Admitting: Internal Medicine

## 2014-11-11 MED ORDER — GLUCOSE BLOOD VI STRP: ORAL_STRIP | Status: DC

## 2014-11-27 ENCOUNTER — Ambulatory Visit
Admission: RE | Admit: 2014-11-27 | Discharge: 2014-11-27 | Disposition: A | Discharge status: Home / Self Care | Payer: 59 | Source: Ambulatory Visit | Attending: Internal Medicine | Admitting: Internal Medicine

## 2014-11-27 ENCOUNTER — Other Ambulatory Visit: Payer: Self-pay | Admitting: Internal Medicine

## 2014-11-27 DIAGNOSIS — Z13820 Encounter for screening for osteoporosis: Secondary | ICD-10-CM | POA: Insufficient documentation

## 2014-11-27 DIAGNOSIS — Z1231 Encounter for screening mammogram for malignant neoplasm of breast: Secondary | ICD-10-CM | POA: Diagnosis not present

## 2014-11-27 DIAGNOSIS — Z1239 Encounter for other screening for malignant neoplasm of breast: Secondary | ICD-10-CM

## 2014-11-29 ENCOUNTER — Encounter: Payer: Self-pay | Admitting: Internal Medicine

## 2014-12-03 ENCOUNTER — Ambulatory Visit
Admission: RE | Admit: 2014-12-03 | Discharge: 2014-12-03 | Disposition: A | Discharge status: Home / Self Care | Payer: 59 | Source: Ambulatory Visit | Attending: Internal Medicine | Admitting: Internal Medicine

## 2014-12-03 ENCOUNTER — Other Ambulatory Visit: Payer: 59

## 2014-12-03 DIAGNOSIS — Z1239 Encounter for other screening for malignant neoplasm of breast: Secondary | ICD-10-CM

## 2014-12-24 ENCOUNTER — Other Ambulatory Visit: Payer: Self-pay | Admitting: Internal Medicine

## 2014-12-28 ENCOUNTER — Telehealth: Payer: Self-pay

## 2015-03-01 LAB — HM DIABETES EYE EXAM

## 2015-03-11 ENCOUNTER — Telehealth: Payer: Self-pay | Admitting: *Deleted

## 2015-03-11 NOTE — Telephone Encounter (Signed)
Documented  Eye exam.

## 2015-03-22 ENCOUNTER — Other Ambulatory Visit: Payer: Self-pay

## 2015-03-22 ENCOUNTER — Encounter: Payer: Self-pay | Admitting: Internal Medicine

## 2015-03-22 VITALS — BP 150/72 | Ht 65.0 in | Wt 221.0 lb

## 2015-03-22 DIAGNOSIS — E1169 Type 2 diabetes mellitus with other specified complication: Secondary | ICD-10-CM

## 2015-03-22 DIAGNOSIS — E669 Obesity, unspecified: Principal | ICD-10-CM

## 2015-03-22 LAB — POCT GLYCOSYLATED HEMOGLOBIN (HGB A1C): Hemoglobin A1C: 6.5

## 2015-03-22 NOTE — Patient Outreach (Signed)
[facility]) Care Management  [place]  03/22/2015   Jennifer Ruiz 16-May-1946 [id number]  Subjective: Patient in for her regularly scheduled Link to Wellness visit.  She has recently been to see her primary doctor, has completed her mammogram, dental and eye exam. She has had her flu vaccine but has not had her TDAP or her shingles vaccine.  She has had a difficult last few months after her mother died in 06-07-14.  Being an only child and her mother died in [place], [place], she has been cleaning out the estate and preparing her house to be put on the market. She admits to not exercising and eating sweets as a way of managing stress  Objective:  Filed Vitals:   03/22/15 0952  BP: 150/72  Height: 1.651 m (5\' 5" )  Weight: 221 lb (100.245 kg)   POCT A1C 6.5%  Current Medications:  Current Outpatient Prescriptions  Medication Sig Dispense Refill  . glucose blood (BAYER CONTOUR NEXT TEST) test strip Check sugar daily Dx E11.9 100 each 3  . lisinopril (PRINIVIL,ZESTRIL) 10 MG tablet TAKE 1 TABLET (10 MG TOTAL) BY MOUTH DAILY. 90 tablet 1  . cholecalciferol (VITAMIN D) 1000 UNITS tablet Take 1,000 Units by mouth daily.    . Lancets (ONETOUCH ULTRASOFT) lancets Use one time daily to scheck blood sugars   250.00 100 each 3  . Tdap (BOOSTRIX) 5-2.5-18.5 LF-MCG/0.5 injection Inject 0.5 mLs into the muscle once. (Patient not taking: Reported on 11/04/2014) 0.5 mL 0  . Vitamin D, Ergocalciferol, (DRISDOL) 50000 UNITS CAPS Take 50,000 Units by mouth every 7 (seven) days.    Marland Kitchen zoster vaccine live, PF, (ZOSTAVAX) 27035 UNT/0.65ML injection Inject 19,400 Units into the skin once. (Patient not taking: Reported on 11/04/2014) 1 each 0  . zoster vaccine live, PF, (ZOSTAVAX) 00938 UNT/0.65ML injection Inject 19,400 Units into the skin once. 1 each 0   No current facility-administered medications for this visit.    Functional Status:  In your present state of health, do you  have any difficulty performing the following activities: 03/22/2015  Hearing? N  Vision? N  Difficulty concentrating or making decisions? N  Walking or climbing stairs? N  Dressing or bathing? N  Doing errands, shopping? N    Fall/Depression Screening: PHQ 2/9 Scores 03/22/2015 03/22/2015 11/04/2014 09/09/2014 10/15/2013  PHQ - 2 Score 0 0 0 0 0    Assessment: Although Jennifer Ruiz's A1C remains ideal, her blood sugars are elevated compared to June.  This month, fasting sugars are 139-159 mg/dl (compared to 118-152 mg/dl in June) and 150's 2 hours post partum (compared to 120-140 mg/dl in June).   Plan: Now that Jennifer Ruiz has cleaned out her mother's estate and the house is on the market, Jennifer Ruiz is working on the next stressful change in her life- she is planning on retiring in 06/08/2015.  She tells me this with tears in her eyes and tells me she's "decided" to retire in order to prevent having her only co-worker from being transferred to [place]. We discussed at length, the need for her to have a "plan" in place BEFORE she retires.  Her treadmill currently if filled with old photos from her mother's house. She has a large group of women who are ready for her to retire and travel with them.  She verbalizes the desire to manage her diabetes by diet and exercise.   THN CM Care Plan Problem One        Most Recent  Value   Care Plan Problem One  Has not completed preventative tests as requested by MD   Role Documenting the Problem One  Care Management [place] for Problem One  Active   THN Long Term Goal (31-90 days)  Complete the preventative tests MD has requested by your next visit with her   [place] Term Goal Start Date  03/22/15 [completed mammogram in summer 2016- still needs vaccines]   Interventions for Problem One Long Term Goal  - see [facility] clinic to get Tdap, Shingles injections.  Schedule mammogram     When she retires, she will no longer be eligible for the Link to Wellness  benefits.   [name], RN, BA, [facility], [place] Direct Dial:  [phone]  Fax:  [phone] E-mail: [email or web address] [address], [place], [place]  [address]

## 2015-03-31 ENCOUNTER — Telehealth: Payer: Self-pay

## 2015-06-21 ENCOUNTER — Other Ambulatory Visit: Payer: Self-pay | Admitting: Internal Medicine

## 2015-07-12 ENCOUNTER — Telehealth: Payer: Self-pay

## 2015-10-08 ENCOUNTER — Encounter: Payer: Self-pay | Admitting: Family Medicine

## 2015-10-08 ENCOUNTER — Ambulatory Visit (INDEPENDENT_AMBULATORY_CARE_PROVIDER_SITE_OTHER): Payer: 59 | Admitting: Family Medicine

## 2015-10-08 VITALS — BP 136/64 | HR 91 | Temp 97.8°F | Ht 64.5 in | Wt 224.2 lb

## 2015-10-08 DIAGNOSIS — I872 Venous insufficiency (chronic) (peripheral): Secondary | ICD-10-CM | POA: Diagnosis not present

## 2015-10-08 NOTE — Patient Instructions (Signed)

## 2015-10-08 NOTE — Assessment & Plan Note (Signed)
New problem. Patient's history and physical exam consistent with venous insufficiency. Advised compression. Hand written prescription was given for compression stockings today. Offered vascular referral and patient declined at this time. Has follow up with PCP.

## 2015-10-08 NOTE — Progress Notes (Signed)
Pre visit review using our clinic review tool, if applicable. No additional management support is needed unless otherwise documented below in the visit note. 

## 2015-10-08 NOTE — Progress Notes (Signed)
   Subjective:  Patient ID: Jennifer Ruiz, female    DOB: May 01, 1946  Age: 70 y.o. MRN: [id number]  CC: Leg/ankle swelling  HPI:  70 year old female with a past history of obesity, DM 2 presents with the above complaints.  Patient states that she's noticed leg/ankle swelling for the past 3 weeks. She states that she sits for long periods of time at work. She denies any associated shortness of breath or chest pain. She states that it seems to be exacerbated by sitting at work. She states it is worse at the end of the day. She states that it does improve with elevation. No other complaints this time.   Social Hx   Social History   Social History  . Marital Status: Divorced    Spouse Name: N/A  . Number of Children: N/A  . Years of Education: N/A   Social History Main Topics  . Smoking status: Never Smoker   . Smokeless tobacco: Never Used  . Alcohol Use: No  . Drug Use: No  . Sexual Activity: Not Asked   Other Topics Concern  . None   Social History Narrative   Review of Systems  Constitutional: Negative.   Respiratory: Negative for shortness of breath.   Cardiovascular: Positive for leg swelling.   Objective:  BP 136/64 mmHg  Pulse 91  Temp(Src) 97.8 F (36.6 C) (Oral)  Ht 5' 4.5" (1.638 m)  Wt 224 lb 4 oz (101.719 kg)  BMI 37.91 kg/m2  SpO2 98%  BP/Weight 10/08/2015 Q000111Q [id number]  Systolic BP [id number] Q000111Q 123456  Diastolic BP 64 72 80  Wt. (Lbs) 224.25 221 225.75  BMI 37.91 36.78 38.17   Physical Exam  Constitutional: She is oriented to person, place, and time. She appears well-developed. No distress.  Cardiovascular: Normal rate and regular rhythm.   Bilateral lower extremities with 1+ pitting edema. Varicose veins noted diffusely.  Pulmonary/Chest: Effort normal and breath sounds normal. She has no wheezes. She has no rales.  Neurological: She is alert and oriented to person, place, and time.  Psychiatric: She has a normal mood and affect.  Vitals  reviewed.  Lab Results  Component Value Date   WBC 5.2 10/15/2013   HGB 13.7 10/15/2013   HCT 41.1 10/15/2013   PLT 193.0 10/15/2013   GLUCOSE 140* 11/04/2014   CHOL 158 11/04/2014   TRIG 152.0* 11/04/2014   HDL 40.00 11/04/2014   LDLCALC 88 11/04/2014   ALT 20 11/04/2014   AST 25 11/04/2014   NA 140 11/04/2014   K 4.7 11/04/2014   CL 104 11/04/2014   CREATININE 0.69 11/04/2014   BUN 10 11/04/2014   CO2 29 11/04/2014   TSH 1.27 10/15/2013   HGBA1C 6.5 03/22/2015   MICROALBUR 0.8 11/04/2014    Assessment & Plan:   Problem List Items Addressed This Visit    Venous insufficiency of both lower extremities - Primary    New problem. Patient's history and physical exam consistent with venous insufficiency. Advised compression. Hand written prescription was given for compression stockings today. Offered vascular referral and patient declined at this time. Has follow up with PCP.         Follow-up: As scheduled with PCP  [facility]

## 2015-10-21 ENCOUNTER — Telehealth: Payer: Self-pay | Admitting: Internal Medicine

## 2015-10-21 DIAGNOSIS — E119 Type 2 diabetes mellitus without complications: Secondary | ICD-10-CM

## 2015-10-21 DIAGNOSIS — R601 Generalized edema: Secondary | ICD-10-CM

## 2015-10-21 DIAGNOSIS — E785 Hyperlipidemia, unspecified: Secondary | ICD-10-CM

## 2015-10-21 NOTE — Telephone Encounter (Signed)
Patient was seen on the 5/5 by Dr. Lacinda Axon for Venous insufficiency.  Got compression stockings has been wearing, but swelling remains and is up to the knees now.  Please advise an appt time and date, patient available anytime. Thanks

## 2015-10-21 NOTE — Telephone Encounter (Signed)
Pt called stating that she is still having swelling in her feet and it has now moved to her knees.. Pt was given support hose and is still having these issuse when wearing them.. Pt would like to see Dr. Derrel Nip.. She can come in at anytime on any day.. Please advise pt at [phone]

## 2015-10-21 NOTE — Telephone Encounter (Signed)
She is a diabetic and hasn't seen by since June 2016.  She should have had a 6 month follow up in December. seh will need to have fasting labs done prior to the visit and needs a 30 minute visit with me next week.  You can use an 11:30 or 1 4:30 for the visit . Labs ordered

## 2015-10-21 NOTE — Telephone Encounter (Signed)
Scheduled for fasting labs tomorrow and an appt next Thursday, thanks

## 2015-10-22 ENCOUNTER — Other Ambulatory Visit (INDEPENDENT_AMBULATORY_CARE_PROVIDER_SITE_OTHER): Payer: 59

## 2015-10-22 DIAGNOSIS — R601 Generalized edema: Secondary | ICD-10-CM

## 2015-10-22 DIAGNOSIS — E785 Hyperlipidemia, unspecified: Secondary | ICD-10-CM

## 2015-10-22 DIAGNOSIS — E119 Type 2 diabetes mellitus without complications: Secondary | ICD-10-CM | POA: Diagnosis not present

## 2015-10-22 LAB — COMPREHENSIVE METABOLIC PANEL
ALT: 11 U/L (ref 0–35)
AST: 25 U/L (ref 0–37)
Albumin: 3.4 g/dL — ABNORMAL LOW (ref 3.5–5.2)
Alkaline Phosphatase: 69 U/L (ref 39–117)
BILIRUBIN TOTAL: 1.2 mg/dL (ref 0.2–1.2)
BUN: 10 mg/dL (ref 6–23)
CO2: 27 meq/L (ref 19–32)
CREATININE: 0.61 mg/dL (ref 0.40–1.20)
Calcium: 8.9 mg/dL (ref 8.4–10.5)
Chloride: 106 mEq/L (ref 96–112)
GFR: 102.74 mL/min (ref >60.00)
GLUCOSE: 109 mg/dL — AB (ref 70–99)
Potassium: 5 mEq/L (ref 3.5–5.1)
Sodium: 141 mEq/L (ref 135–145)
Total Protein: 5.7 g/dL — ABNORMAL LOW (ref 6.0–8.3)

## 2015-10-22 LAB — LIPID PANEL
CHOL/HDL RATIO: 4
Cholesterol: 107 mg/dL (ref 0–200)
HDL: 23.7 mg/dL — AB (ref >39.00)
LDL CALC: 65 mg/dL (ref 0–99)
NonHDL: 82.88
TRIGLYCERIDES: 88 mg/dL (ref 0.0–149.0)
VLDL: 17.6 mg/dL (ref 0.0–40.0)

## 2015-10-22 LAB — MICROALBUMIN / CREATININE URINE RATIO
Creatinine,U: 250.8 mg/dL
Microalb Creat Ratio: 0.5 mg/g (ref 0.0–30.0)
Microalb, Ur: 1.3 mg/dL (ref 0.0–1.9)

## 2015-10-22 LAB — HEMOGLOBIN A1C: Hgb A1c MFr Bld: 6.1 % (ref 4.6–6.5)

## 2015-10-22 LAB — LDL CHOLESTEROL, DIRECT: Direct LDL: 57 mg/dL

## 2015-10-22 LAB — TSH: TSH: 2.81 u[IU]/mL (ref 0.35–4.50)

## 2015-10-24 ENCOUNTER — Encounter: Payer: Self-pay | Admitting: Internal Medicine

## 2015-10-28 ENCOUNTER — Ambulatory Visit (INDEPENDENT_AMBULATORY_CARE_PROVIDER_SITE_OTHER): Payer: 59 | Admitting: Internal Medicine

## 2015-10-28 ENCOUNTER — Encounter: Payer: Self-pay | Admitting: Internal Medicine

## 2015-10-28 VITALS — BP 140/72 | HR 86 | Temp 97.7°F | Resp 12 | Ht 65.0 in | Wt 222.5 lb

## 2015-10-28 DIAGNOSIS — E669 Obesity, unspecified: Secondary | ICD-10-CM | POA: Diagnosis not present

## 2015-10-28 DIAGNOSIS — IMO0001 Reserved for inherently not codable concepts without codable children: Secondary | ICD-10-CM

## 2015-10-28 DIAGNOSIS — R601 Generalized edema: Secondary | ICD-10-CM

## 2015-10-28 DIAGNOSIS — I872 Venous insufficiency (chronic) (peripheral): Secondary | ICD-10-CM

## 2015-10-28 DIAGNOSIS — E119 Type 2 diabetes mellitus without complications: Secondary | ICD-10-CM | POA: Diagnosis not present

## 2015-10-28 DIAGNOSIS — R030 Elevated blood-pressure reading, without diagnosis of hypertension: Secondary | ICD-10-CM

## 2015-10-28 DIAGNOSIS — E1169 Type 2 diabetes mellitus with other specified complication: Secondary | ICD-10-CM

## 2015-10-28 MED ORDER — FUROSEMIDE 20 MG PO TABS: 20.0000 mg | ORAL_TABLET | Freq: Every day | ORAL | Status: DC

## 2015-10-28 NOTE — Progress Notes (Signed)
Pre-visit discussion using our clinic review tool. No additional management support is needed unless otherwise documented below in the visit note.  

## 2015-10-28 NOTE — Patient Instructions (Addendum)
The  diet I discussed with you today is the 10 day Green Smoothie Cleansing /Detox Diet by Linden Dolin . available on [place] for around $10.  This is not a low carb or a weight loss diet,  It is fundamentally a "cleansing" low fat diet that eliminates sugar, gluten, caffeine, alcohol and dairy for 10 days .  What you add back after the initial ten days is entirely up to  you!  You can expect to lose 5 to 10 lbs depending on how strict you are.   I suggest drinking 2 smoothies daily and keeping one chewable meal (but keep it simple, like baked fish and salad, rice or bok choy) .  You snack primarily on fresh  fruit, egg whites and judicious quantities of nuts. You can add vegetable based protein powder (nothing with whey , since whey is dairy) in it.  [employer] has a Research officer, political party .  I suggest using /drinking 2 smoothies daily and have one sensible low fat meal ,  The snacks are primarily fruit, egg whites and nuts.   It does require some form of a nutrient extractor (Vita Mix, a electric juicer,  Or a Nutribullet Rx).  i have found that using frozen fruits is much more convenient and cost effective. You can even find plenty of organic fruit in the frozen fruit section of [employer].  Just thaw what you need for the following day the night before in the refrigerator (to avoid jamming up your machine)    Exercise will also raise HDL   Mediterranean Diet try America's Test Kitchen or any other cook book  about the mediterranean diet   Try the Heritage Flakes cereal available at [facility] on the bottom shelf, with vanilla flavored almond milk  [employer] sells an organic almond mik, non refrigerated,  Called :"orgain"  That is fortified with protein and very low carb    To make a low carb chip :  Take the Joseph's Lavash or Pita bread,  Or the [place] Low carb whole wheat tortilla   Place on metal cookie sheet  Brush with olive oil  Sprinkle garlic powder (NOT garlic salt), grated parmesan cheese,  mediterranean seasoning , or all of them?  Bake at 275 for 30 minutes   We have substitutions for your potatoes!!  Try the mashed cauliflower and riced cauliflower dishes instead of rice and mashed potatoes  Mashed turnips are also very low carb!   For desserts :  Try the Dannon Lt n Fit greek yogurt dessert flavors and top with reddi Whip .  8 carbs,  80 calories  Try Oikos Triple Zero [place] Yogurt in the salted caramel, and the coffee flavors  With Whipped Cream for dessert  breyer's low carb ice cream, available in bars (on a stick, better ) or scoopable ice cream  HERE ARE THE LOW CARB  BREAD CHOICES

## 2015-10-28 NOTE — Progress Notes (Signed)
Subjective:  Patient ID: Jennifer Ruiz, female    DOB: March 11, 1946  Age: 70 y.o. MRN: [id number]  CC: The primary encounter diagnosis was Generalized edema. Diagnoses of Venous insufficiency of both lower extremities, Obesity (BMI 30-39.9), Diabetes mellitus type 2 in obese ([place]), and White coat hypertension were also pertinent to this visit.  HPI Jennifer Ruiz presents for FOLLOW UP ON Diet controlled Diabetes with obesity and hypertension , and VENOUS INSUFFICIENCY.  Last seen June 2016 by me.  Treated by Dr Lacinda Axon 2 weeks ago for VI.  She has been using compression stocking, but her edema is aggravated by a sedentary lifestyle.   .  Not exercising.  Sits all day long .   By the end of the day legs feel tight and stomach feels tight too . NO diuretics have been prescribed.  Denies orthopnea.  Check sugars rarely, usually when feeling poorly.  None above 160 and none below 80.  Fasting labs done recently were reviewed today.  Diet was discussed in detail.    Outpatient Prescriptions Prior to Visit  Medication Sig Dispense Refill  . glucose blood (BAYER CONTOUR NEXT TEST) test strip Check sugar daily Dx E11.9 100 each 3  . Lancets (ONETOUCH ULTRASOFT) lancets Use one time daily to scheck blood sugars   250.00 100 each 3  . lisinopril (PRINIVIL,ZESTRIL) 10 MG tablet TAKE 1 TABLET (10 MG TOTAL) BY MOUTH DAILY. 90 tablet 1  . cholecalciferol (VITAMIN D) 1000 UNITS tablet Take 1,000 Units by mouth daily. Reported on 10/28/2015    . Vitamin D, Ergocalciferol, (DRISDOL) 50000 UNITS CAPS Take 50,000 Units by mouth every 7 (seven) days. Reported on 10/28/2015    . zoster vaccine live, PF, (ZOSTAVAX) 16109 UNT/0.65ML injection Inject 19,400 Units into the skin once. (Patient not taking: Reported on 10/28/2015) 1 each 0  . Tdap (BOOSTRIX) 5-2.5-18.5 LF-MCG/0.5 injection Inject 0.5 mLs into the muscle once. 0.5 mL 0  . zoster vaccine live, PF, (ZOSTAVAX) 60454 UNT/0.65ML injection Inject 19,400 Units  into the skin once. 1 each 0   No facility-administered medications prior to visit.    Review of Systems;  Patient denies headache, fevers, malaise, unintentional weight loss, skin rash, eye pain, sinus congestion and sinus pain, sore throat, dysphagia,  hemoptysis , cough, dyspnea, wheezing, chest pain, palpitations, orthopnea, edema, abdominal pain, nausea, melena, diarrhea, constipation, flank pain, dysuria, hematuria, urinary  Frequency, nocturia, numbness, tingling, seizures,  Focal weakness, Loss of consciousness,  Tremor, insomnia, depression, anxiety, and suicidal ideation.      Objective:  BP 140/72 mmHg  Pulse 86  Temp(Src) 97.7 F (36.5 C) (Oral)  Resp 12  Ht 5\' 5"  (1.651 m)  Wt 222 lb 8 oz (100.925 kg)  BMI 37.03 kg/m2  SpO2 97%  BP Readings from Last 3 Encounters:  10/28/15 140/72  10/08/15 136/64  03/22/15 150/72    Wt Readings from Last 3 Encounters:  10/28/15 222 lb 8 oz (100.925 kg)  10/08/15 224 lb 4 oz (101.719 kg)  03/22/15 221 lb (100.245 kg)    General appearance: alert, cooperative and appears stated age Ears: normal TM's and external ear canals both ears Throat: lips, mucosa, and tongue normal; teeth and gums normal Neck: no adenopathy, no carotid bruit, supple, symmetrical, trachea midline and thyroid not enlarged, symmetric, no tenderness/mass/nodules Back: symmetric, no curvature. ROM normal. No CVA tenderness. Lungs: clear to auscultation bilaterally Heart: regular rate and rhythm, S1, S2 normal, no murmur, click, rub or gallop Abdomen: soft, non-tender;  bowel sounds normal; no masses,  no organomegaly Pulses: 2+ and symmetric Skin: Skin color, texture, turgor normal. No rashes or lesions Lymph nodes: Cervical, supraclavicular, and axillary nodes normal. Ext: 1 + pitting edema to mid tibia. Bilaterally.   Lab Results  Component Value Date   HGBA1C 6.1 10/22/2015   HGBA1C 6.5 03/22/2015   HGBA1C 6.4* 09/04/2014    Lab Results    Component Value Date   CREATININE 0.61 10/22/2015   CREATININE 0.69 11/04/2014   CREATININE 0.7 04/27/2014    Lab Results  Component Value Date   WBC 5.2 10/15/2013   HGB 13.7 10/15/2013   HCT 41.1 10/15/2013   PLT 193.0 10/15/2013   GLUCOSE 109* 10/22/2015   CHOL 107 10/22/2015   TRIG 88.0 10/22/2015   HDL 23.70* 10/22/2015   LDLDIRECT 57.0 10/22/2015   LDLCALC 65 10/22/2015   ALT 11 10/22/2015   AST 25 10/22/2015   NA 141 10/22/2015   K 5.0 10/22/2015   CL 106 10/22/2015   CREATININE 0.61 10/22/2015   BUN 10 10/22/2015   CO2 27 10/22/2015   TSH 2.81 10/22/2015   HGBA1C 6.1 10/22/2015   MICROALBUR 1.3 10/22/2015    No results found.  Assessment & Plan:   Problem List Items Addressed This Visit    Diabetes mellitus type 2 in obese [facility])    her diabetes continues to be well-controlled on diet alone .  hemoglobin A1c has been consistently at or  less than 7.0 . Patient is up-to-date on eye exams and foot exam is normal today. Patient has no microalbuminuria. Patient is tolerating ACE Inhibitor therapy for renal protection and hypertension   Lab Results  Component Value Date   HGBA1C 6.1 10/22/2015   Lab Results  Component Value Date   MICROALBUR 1.3 10/22/2015            Obesity (BMI 30-39.9)    I have addressed  BMI and recommended wt loss of 10% of body weigh over the next 6 months using a low glycemic index diet and regular exercise a minimum of 5 days per week.        White coat hypertension    Her home bp machine has been calibrated at the hospital and considered accurate.  She reports that her pressures at home have been consistently under 140/80 .  No changes today  Lab Results  Component Value Date   CREATININE 0.61 10/22/2015   Lab Results  Component Value Date   NA 141 10/22/2015   K 5.0 10/22/2015   CL 106 10/22/2015   CO2 27 10/22/2015   Lab Results  Component Value Date   MICROALBUR 1.3 10/22/2015             Relevant  Medications   furosemide (LASIX) 20 MG tablet   Venous insufficiency of both lower extremities    Adding low dose furosemdie for prn use, not more than 2-3 times per week  Lab Results  Component Value Date   NA 141 10/22/2015   K 5.0 10/22/2015   CL 106 10/22/2015   CO2 27 10/22/2015   Lab Results  Component Value Date   CREATININE 0.61 10/22/2015         Relevant Medications   furosemide (LASIX) 20 MG tablet    Other Visit Diagnoses    Generalized edema    -  Primary    Relevant Orders    Ambulatory referral to Vascular Surgery       I have  discontinued Ms. Krienke's Tdap. I am also having her start on furosemide. Additionally, I am having her maintain her Vitamin D (Ergocalciferol), onetouch ultrasoft, cholecalciferol, zoster vaccine live (PF), glucose blood, and lisinopril.  Meds ordered this encounter  Medications  . furosemide (LASIX) 20 MG tablet    Sig: Take 1 tablet (20 mg total) by mouth daily. As needed for fluid retention    Dispense:  30 tablet    Refill:  3    Medications Discontinued During This Encounter  Medication Reason  . Tdap (BOOSTRIX) 5-2.5-18.5 LF-MCG/0.5 injection Error  . zoster vaccine live, PF, (ZOSTAVAX) 02725 UNT/0.65ML injection Error    Follow-up: No Follow-up on file.   Crecencio Mc, MD

## 2015-10-30 NOTE — Assessment & Plan Note (Signed)
her diabetes continues to be well-controlled on diet alone .  hemoglobin A1c has been consistently at or  less than 7.0 . Patient is up-to-date on eye exams and foot exam is normal today. Patient has no microalbuminuria. Patient is tolerating ACE Inhibitor therapy for renal protection and hypertension   Lab Results  Component Value Date   HGBA1C 6.1 10/22/2015   Lab Results  Component Value Date   MICROALBUR 1.3 10/22/2015

## 2015-10-30 NOTE — Assessment & Plan Note (Signed)
I have addressed  BMI and recommended wt loss of 10% of body weigh over the next 6 months using a low glycemic index diet and regular exercise a minimum of 5 days per week.   

## 2015-10-30 NOTE — Assessment & Plan Note (Signed)
Adding low dose furosemdie for prn use, not more than 2-3 times per week  Lab Results  Component Value Date   NA 141 10/22/2015   K 5.0 10/22/2015   CL 106 10/22/2015   CO2 27 10/22/2015   Lab Results  Component Value Date   CREATININE 0.61 10/22/2015

## 2015-10-30 NOTE — Assessment & Plan Note (Signed)
Her home bp machine has been calibrated at the hospital and considered accurate.  She reports that her pressures at home have been consistently under 140/80 .  No changes today  Lab Results  Component Value Date   CREATININE 0.61 10/22/2015   Lab Results  Component Value Date   NA 141 10/22/2015   K 5.0 10/22/2015   CL 106 10/22/2015   CO2 27 10/22/2015   Lab Results  Component Value Date   MICROALBUR 1.3 10/22/2015

## 2015-11-29 DIAGNOSIS — M7989 Other specified soft tissue disorders: Secondary | ICD-10-CM | POA: Diagnosis not present

## 2015-12-01 ENCOUNTER — Other Ambulatory Visit: Payer: Self-pay | Admitting: Internal Medicine

## 2015-12-01 ENCOUNTER — Ambulatory Visit (INDEPENDENT_AMBULATORY_CARE_PROVIDER_SITE_OTHER): Payer: 59 | Admitting: Internal Medicine

## 2015-12-01 VITALS — BP 148/64 | HR 88 | Temp 97.8°F | Resp 12 | Ht 64.0 in | Wt 224.5 lb

## 2015-12-01 DIAGNOSIS — R600 Localized edema: Secondary | ICD-10-CM

## 2015-12-01 DIAGNOSIS — Z0000 Encounter for general adult medical examination without abnormal findings: Secondary | ICD-10-CM

## 2015-12-01 DIAGNOSIS — R140 Abdominal distension (gaseous): Secondary | ICD-10-CM | POA: Diagnosis not present

## 2015-12-01 DIAGNOSIS — Z1239 Encounter for other screening for malignant neoplasm of breast: Secondary | ICD-10-CM

## 2015-12-01 DIAGNOSIS — Z23 Encounter for immunization: Secondary | ICD-10-CM | POA: Diagnosis not present

## 2015-12-01 DIAGNOSIS — R609 Edema, unspecified: Secondary | ICD-10-CM

## 2015-12-01 NOTE — Progress Notes (Signed)
Patient ID: Jennifer Ruiz, female    DOB: 1945/10/04  Age: 70 y.o. MRN: [id number]  The patient is here for annual preventive/physical  examination and management of other chronic and acute problems.  Mammogram and bone density June 2016 Colonoscopy 2014  Fasting labs may 2017 A1x 6.1 no proteinuria Last eye exam sept 2016 [facility]   The risk factors are reflected in the social history.  The roster of all physicians providing medical care to patient - is listed in the Snapshot section of the chart.  Activities of daily living:  The patient is 100% independent in all ADLs: dressing, toileting, feeding as well as independent mobility  Home safety : The patient has smoke detectors in the home. They wear seatbelts.  There are no firearms at home. There is no violence in the home.   There is no risks for hepatitis, STDs or HIV. There is no   history of blood transfusion. They have no travel history to infectious disease endemic areas of the world.  The patient has seen their dentist in the last six month. They have seen their eye doctor in the last year. They admit to slight hearing difficulty with regard to whispered voices and some television programs.  They have deferred audiologic testing in the last year.  They do not  have excessive sun exposure. Discussed the need for sun protection: hats, long sleeves and use of sunscreen if there is significant sun exposure.   Diet: the importance of a healthy diet is discussed. They do have a healthy diet.  The benefits of regular aerobic exercise were discussed. She walks 4 times per week ,  20 minutes.   Depression screen: there are no signs or vegative symptoms of depression- irritability, change in appetite, anhedonia, sadness/tearfullness.  Cognitive assessment: the patient manages all their financial and personal affairs and is actively engaged. They could relate day,date,year and events; recalled 2/3 objects at 3 minutes; performed  clock-face test normally.  The following portions of the patient's history were reviewed and updated as appropriate: allergies, current medications, past family history, past medical history,  past surgical history, past social history  and problem list.  Visual acuity was not assessed per patient preference since she has regular follow up with her ophthalmologist. Hearing and body mass index were assessed and reviewed.   During the course of the visit the patient was educated and counseled about appropriate screening and preventive services including : fall prevention , diabetes screening, nutrition counseling, colorectal cancer screening, and recommended immunizations.    CC: The primary encounter diagnosis was Abdominal distension. Diagnoses of Breast cancer screening, Need for prophylactic vaccination against Streptococcus pneumoniae (pneumococcus), Edema extremities, and Medicare annual wellness visit, subsequent were also pertinent to this visit.   Feels bloated a lot,  Stomach feels hard.  Bowel moving daily and not hard  History Nekeya has a past medical history of Elevated blood pressure reading; Diabetes mellitus without complication ([place]); and Breast lump in female (2014).   She has past surgical history that includes Shoulder surgery (2011); Breast lumpectomy; Breast biopsy (1996); and Breast biopsy (2014).   Her family history includes Asthma in her father; Breast cancer in her mother; Cancer (age of onset: 69) in her mother; Diabetes in her father.She reports that she has never smoked. She has never used smokeless tobacco. She reports that she does not drink alcohol or use illicit drugs.  Outpatient Prescriptions Prior to Visit  Medication Sig Dispense Refill  . furosemide (  LASIX) 20 MG tablet Take 1 tablet (20 mg total) by mouth daily. As needed for fluid retention 30 tablet 3  . glucose blood (BAYER CONTOUR NEXT TEST) test strip Check sugar daily Dx E11.9 100 each 3  . Lancets  (ONETOUCH ULTRASOFT) lancets Use one time daily to scheck blood sugars   250.00 100 each 3  . lisinopril (PRINIVIL,ZESTRIL) 10 MG tablet TAKE 1 TABLET (10 MG TOTAL) BY MOUTH DAILY. 90 tablet 1  . cholecalciferol (VITAMIN D) 1000 UNITS tablet Take 1,000 Units by mouth daily. Reported on 12/01/2015    . Vitamin D, Ergocalciferol, (DRISDOL) 50000 UNITS CAPS Take 50,000 Units by mouth every 7 (seven) days. Reported on 12/01/2015    . zoster vaccine live, PF, (ZOSTAVAX) 60454 UNT/0.65ML injection Inject 19,400 Units into the skin once. (Patient not taking: Reported on 10/28/2015) 1 each 0   No facility-administered medications prior to visit.    Review of Systems   Patient denies headache, fevers, malaise, unintentional weight loss, skin rash, eye pain, sinus congestion and sinus pain, sore throat, dysphagia,  hemoptysis , cough, dyspnea, wheezing, chest pain, palpitations, orthopnea, edema, abdominal pain, nausea, melena, diarrhea, constipation, flank pain, dysuria, hematuria, urinary  Frequency, nocturia, numbness, tingling, seizures,  Focal weakness, Loss of consciousness,  Tremor, insomnia, depression, anxiety, and suicidal ideation.      Objective:  BP 148/64 mmHg  Pulse 88  Temp(Src) 97.8 F (36.6 C) (Oral)  Resp 12  Ht 5\' 4"  (1.626 m)  Wt 224 lb 8 oz (101.833 kg)  BMI 38.52 kg/m2  SpO2 97%  Physical Exam  General appearance: alert, cooperative and appears stated age Head: Normocephalic, without obvious abnormality, atraumatic Eyes: conjunctivae/corneas clear. PERRL, EOM's intact. Fundi benign. Ears: normal TM's and external ear canals both ears Nose: Nares normal. Septum midline. Mucosa normal. No drainage or sinus tenderness. Throat: lips, mucosa, and tongue normal; teeth and gums normal Neck: no adenopathy, no carotid bruit, no JVD, supple, symmetrical, trachea midline and thyroid not enlarged, symmetric, no tenderness/mass/nodules Lungs: clear to auscultation  bilaterally Breasts: normal appearance, no masses or tenderness Heart: regular rate and rhythm, S1, S2 normal, no murmur, click, rub or gallop Abdomen: soft, non-tender; bowel sounds normal; no masses,  no organomegaly Extremities: extremities normal, atraumatic, no cyanosis or edema Pulses: 2+ and symmetric Skin: Skin color, texture, turgor normal. No rashes or lesions Neurologic: Alert and oriented X 3, normal strength and tone. Normal symmetric reflexes. Normal coordination and gait.   Assessment & Plan:   Problem List Items Addressed This Visit    Medicare annual wellness visit, subsequent    Annual Medicare wellness  exam was done as well as a comprehensive physical exam and management of acute and chronic conditions .  During the course of the visit the patient was educated and counseled about appropriate screening and preventive services including : fall prevention , diabetes screening, nutrition counseling, colorectal cancer screening, and recommended immunizations.  Printed recommendations for health maintenance screenings was given.       Abdominal distension - Primary    Increased abdominal girth secondary to obesity.  Weight loss recommended. Abdominal ultrasound to rule out hepatomgaly.       Relevant Orders   [place] Art/Ven Flow Abd Pelv Doppler   US Abdomen Limited RUQ   Edema extremities    Mild, recommended use of compression stockings.        Other Visit Diagnoses    Breast cancer screening        Relevant Orders  MM DIGITAL SCREENING BILATERAL    Need for prophylactic vaccination against Streptococcus pneumoniae (pneumococcus)        Relevant Orders    Pneumococcal polysaccharide vaccine 23-valent greater than or equal to 71yo subcutaneous/IM (Completed)       I am having Ms. Kepple maintain her Vitamin D (Ergocalciferol), onetouch ultrasoft, cholecalciferol, zoster vaccine live (PF), glucose blood, lisinopril, and furosemide.  No orders of the defined types  were placed in this encounter.    There are no discontinued medications.  Follow-up: No Follow-up on file.   Crecencio Mc, MD

## 2015-12-01 NOTE — Progress Notes (Signed)
Pre-visit discussion using our clinic review tool. No additional management support is needed unless otherwise documented below in the visit note.  

## 2015-12-01 NOTE — Patient Instructions (Addendum)
[email or web address]  Has a great variety of compression garments including tennis socks  Abdominal ultrasound will be ordered for mon or Fri morning  To evaluate liver   To lose weight:  LOWER CARB DIET AND 30 MINTUES OF EXERCISE (BRISK WALKING) 5 DAYS PER WEEK   Start by assessing your carb intake for a day.  Count up the carbs  Not the sugar.  Subtract the grams of fiber .  Goal is 40 or less daily   Resume 1000 IUs of Vit D daily  I recommend getting the majority of your calcium  through diet rather than supplements given the recent association of calcium supplements with increased coronary artery calcium scores  Try the almond, soy  and cashew milks that most grocery stores  now carry  in the dairy  Section>   They are lactose and cholesterol free.

## 2015-12-02 ENCOUNTER — Encounter: Payer: Self-pay | Admitting: Internal Medicine

## 2015-12-02 MED ORDER — FLUTICASONE PROPIONATE 50 MCG/ACT NA SUSP: 2.0000 | Freq: Every day | NASAL | Status: DC

## 2015-12-02 NOTE — Telephone Encounter (Signed)
Need script for flonase

## 2015-12-02 NOTE — Telephone Encounter (Signed)
sent 

## 2015-12-03 DIAGNOSIS — R600 Localized edema: Secondary | ICD-10-CM | POA: Insufficient documentation

## 2015-12-03 NOTE — Assessment & Plan Note (Signed)
Mild, recommended use of compression stockings.

## 2015-12-03 NOTE — Assessment & Plan Note (Addendum)
Increased abdominal girth secondary to obesity.  Weight loss recommended. Abdominal ultrasound to rule out hepatomgaly.

## 2015-12-03 NOTE — Assessment & Plan Note (Signed)

## 2015-12-10 ENCOUNTER — Ambulatory Visit
Admission: RE | Admit: 2015-12-10 | Discharge: 2015-12-10 | Disposition: A | Discharge status: Home / Self Care | Payer: 59 | Source: Ambulatory Visit | Attending: Internal Medicine | Admitting: Internal Medicine

## 2015-12-10 ENCOUNTER — Ambulatory Visit: Admission: RE | Admit: 2015-12-10 | Payer: 59 | Source: Ambulatory Visit

## 2015-12-10 DIAGNOSIS — K8020 Calculus of gallbladder without cholecystitis without obstruction: Secondary | ICD-10-CM | POA: Insufficient documentation

## 2015-12-10 DIAGNOSIS — K766 Portal hypertension: Secondary | ICD-10-CM | POA: Insufficient documentation

## 2015-12-10 DIAGNOSIS — R188 Other ascites: Secondary | ICD-10-CM | POA: Insufficient documentation

## 2015-12-10 DIAGNOSIS — R161 Splenomegaly, not elsewhere classified: Secondary | ICD-10-CM | POA: Insufficient documentation

## 2015-12-10 DIAGNOSIS — K7460 Unspecified cirrhosis of liver: Secondary | ICD-10-CM | POA: Diagnosis not present

## 2015-12-10 DIAGNOSIS — R140 Abdominal distension (gaseous): Secondary | ICD-10-CM | POA: Diagnosis not present

## 2015-12-12 ENCOUNTER — Other Ambulatory Visit: Payer: Self-pay | Admitting: Internal Medicine

## 2015-12-12 ENCOUNTER — Encounter: Payer: Self-pay | Admitting: Internal Medicine

## 2015-12-12 DIAGNOSIS — K7460 Unspecified cirrhosis of liver: Secondary | ICD-10-CM | POA: Insufficient documentation

## 2015-12-12 DIAGNOSIS — R188 Other ascites: Principal | ICD-10-CM

## 2015-12-15 ENCOUNTER — Ambulatory Visit (INDEPENDENT_AMBULATORY_CARE_PROVIDER_SITE_OTHER): Payer: 59 | Admitting: Internal Medicine

## 2015-12-15 ENCOUNTER — Encounter: Payer: Self-pay | Admitting: Internal Medicine

## 2015-12-15 VITALS — BP 130/66 | HR 84 | Temp 97.6°F | Resp 12 | Ht 64.0 in | Wt 224.2 lb

## 2015-12-15 DIAGNOSIS — K7460 Unspecified cirrhosis of liver: Secondary | ICD-10-CM | POA: Diagnosis not present

## 2015-12-15 DIAGNOSIS — E559 Vitamin D deficiency, unspecified: Secondary | ICD-10-CM

## 2015-12-15 DIAGNOSIS — Z79899 Other long term (current) drug therapy: Secondary | ICD-10-CM

## 2015-12-15 DIAGNOSIS — R188 Other ascites: Secondary | ICD-10-CM

## 2015-12-15 LAB — HEPATIC FUNCTION PANEL
ALK PHOS: 81 U/L (ref 39–117)
ALT: 10 U/L (ref 0–35)
AST: 26 U/L (ref 0–37)
Albumin: 3.3 g/dL — ABNORMAL LOW (ref 3.5–5.2)
BILIRUBIN DIRECT: 0.5 mg/dL — AB (ref 0.0–0.3)
BILIRUBIN TOTAL: 1.5 mg/dL — AB (ref 0.2–1.2)
Total Protein: 6.3 g/dL (ref 6.0–8.3)

## 2015-12-15 LAB — IRON AND TIBC
%SAT: 13 % (ref 11–50)
Iron: 33 ug/dL — ABNORMAL LOW (ref 45–160)
TIBC: 264 ug/dL (ref 250–450)
UIBC: 231 ug/dL (ref 125–400)

## 2015-12-15 LAB — FERRITIN: Ferritin: 138.7 ng/mL (ref 10.0–291.0)

## 2015-12-15 LAB — VITAMIN D 25 HYDROXY (VIT D DEFICIENCY, FRACTURES): VITD: 28.36 ng/mL — ABNORMAL LOW (ref 30.00–100.00)

## 2015-12-15 MED ORDER — LISINOPRIL 10 MG PO TABS: ORAL_TABLET | ORAL | Status: DC

## 2015-12-15 NOTE — Progress Notes (Signed)
Pre-visit discussion using our clinic review tool. No additional management support is needed unless otherwise documented below in the visit note.  

## 2015-12-15 NOTE — Patient Instructions (Addendum)
You can start using the furosemide every day in the morning as soon as you get up. To remove the fluid     You will need to Repeat your potassium level in 10 to 14 days to make sure it has not dropped too low  Referral to Dr Vira Agar is in process.  Ascites Ascites is a collection of excess fluid in the abdomen. Ascites can range from mild to severe. It can get worse without treatment. CAUSES Possible causes include:  Cirrhosis. This is the most common cause of ascites.  Infection or inflammation in the abdomen.  Cancer in the abdomen.  Heart failure.  Kidney disease.  Inflammation of the pancreas.  Clots in the veins of the liver. SIGNS AND SYMPTOMS Signs and symptoms may include:  A feeling of fullness in your abdomen. This is common.  An increase in the size of your abdomen or your waist.  Swelling in your legs.  Swelling of the scrotum in men.  Difficulty breathing.  Abdominal pain.  Sudden weight gain. If the condition is mild, you may not have symptoms. DIAGNOSIS To make a diagnosis, your health care provider will:  Ask about your medical history.  Perform a physical exam.  Order imaging tests, such as an ultrasound or CT scan of your abdomen. TREATMENT Treatment depends on the cause of the ascites. It may include:  Taking a pill to make you urinate. This is called a water pill (diuretic pill).  Strictly reducing your salt (sodium) intake. Salt can cause extra fluid to be kept in the body, and this makes ascites worse.  Having a procedure to remove fluid from your abdomen (paracentesis).  Having a procedure to transfer fluid from your abdomen into a vein.  Having a procedure that connects two of the major veins within your liver and relieves pressure on your liver (TIPS procedure). Ascites may go away or improve with treatment of the condition that caused it.  HOME CARE INSTRUCTIONS  Keep track of your weight. To do this, weigh yourself at the same  time every day and record your weight.  Keep track of how much you drink and any changes in the amount you urinate.  Follow any instructions that your health care provider gives you about how much to drink.  Try not to eat salty (high-sodium) foods.  Take medicines only as directed by your health care provider.  Keep all follow-up visits as directed by your health care provider. This is important.  Report any changes in your health to your health care provider, especially if you develop new symptoms or your symptoms get worse. SEEK MEDICAL CARE IF:  Your gain more than 3 pounds in 3 days.  Your abdominal size or your waist size increases.  You have new swelling in your legs.  The swelling in your legs gets worse. SEEK IMMEDIATE MEDICAL CARE IF:  You develop a fever.  You develop confusion.  You develop new or worsening difficulty breathing.  You develop new or worsening abdominal pain.  You develop new or worsening swelling in the scrotum (in men).   This information is not intended to replace advice given to you by your health care provider. Make sure you discuss any questions you have with your health care provider.   Document Released: 05/22/2005 Document Revised: 06/12/2014 Document Reviewed: 12/19/2013 Elsevier Interactive Patient Education [employer].

## 2015-12-15 NOTE — Progress Notes (Signed)
Subjective:  Patient ID: Jennifer Ruiz, female    DOB: 07/13/1945  Age: 70 y.o. MRN: [id number]  CC: The primary encounter diagnosis was Long-term use of high-risk medication. Diagnoses of Vitamin D deficiency, Ascites, and Cirrhosis of liver with ascites, unspecified hepatic cirrhosis type [facility]) were also pertinent to this visit.  HPI Jennifer Ruiz presents for discussion of recent ultrasound report of abdomen showing ascites,  Splenomegaly and probable cirrhosis .  She has no prior history of enzyme elevation looking back 3 years.  She does not drink alcohol: 4 drinks in a year!  No tylenol products .  No fast food in a year   Used furosemide once last week and dropped a lb.  She reports a feeling of fullness in  her left upper quadrant.  Outpatient Prescriptions Prior to Visit  Medication Sig Dispense Refill  . cholecalciferol (VITAMIN D) 1000 UNITS tablet Take 1,000 Units by mouth daily. Reported on 12/01/2015    . fluticasone (FLONASE) 50 MCG/ACT nasal spray Place 2 sprays into both nostrils daily. 16 g 6  . furosemide (LASIX) 20 MG tablet Take 1 tablet (20 mg total) by mouth daily. As needed for fluid retention 30 tablet 3  . glucose blood (BAYER CONTOUR NEXT TEST) test strip Check sugar daily Dx E11.9 100 each 3  . Lancets (ONETOUCH ULTRASOFT) lancets Use one time daily to scheck blood sugars   250.00 100 each 3  . Vitamin D, Ergocalciferol, (DRISDOL) 50000 UNITS CAPS Take 50,000 Units by mouth every 7 (seven) days. Reported on 12/01/2015    . zoster vaccine live, PF, (ZOSTAVAX) 21308 UNT/0.65ML injection Inject 19,400 Units into the skin once. 1 each 0  . lisinopril (PRINIVIL,ZESTRIL) 10 MG tablet TAKE 1 TABLET (10 MG TOTAL) BY MOUTH DAILY. 90 tablet 1   No facility-administered medications prior to visit.    Review of Systems;  Patient denies headache, fevers, malaise, unintentional weight loss, skin rash, eye pain, sinus congestion and sinus pain, sore throat, dysphagia,   hemoptysis , cough, dyspnea, wheezing, chest pain, palpitations, orthopnea, edema, abdominal pain, nausea, melena, diarrhea, constipation, flank pain, dysuria, hematuria, urinary  Frequency, nocturia, numbness, tingling, seizures,  Focal weakness, Loss of consciousness,  Tremor, insomnia, depression, anxiety, and suicidal ideation.      Objective:  BP 130/66 mmHg  Pulse 84  Temp(Src) 97.6 F (36.4 C) (Oral)  Resp 12  Ht 5\' 4"  (1.626 m)  Wt 224 lb 4 oz (101.719 kg)  BMI 38.47 kg/m2  SpO2 95%  BP Readings from Last 3 Encounters:  12/15/15 130/66  12/01/15 148/64  10/28/15 140/72    Wt Readings from Last 3 Encounters:  12/15/15 224 lb 4 oz (101.719 kg)  12/01/15 224 lb 8 oz (101.833 kg)  10/28/15 222 lb 8 oz (100.925 kg)    General appearance: alert, cooperative and appears stated age Ears: normal TM's and external ear canals both ears Throat: lips, mucosa, and tongue normal; teeth and gums normal Neck: no adenopathy, no carotid bruit, supple, symmetrical, trachea midline and thyroid not enlarged, symmetric, no tenderness/mass/nodules Back: symmetric, no curvature. ROM normal. No CVA tenderness. Lungs: clear to auscultation bilaterally Heart: regular rate and rhythm, S1, S2 normal, no murmur, click, rub or gallop Abdomen: soft, non-tender; bowel sounds normal; no masses,  no organomegaly Pulses: 2+ and symmetric Skin: Skin color, texture, turgor normal. No rashes or lesions Lymph nodes: Cervical, supraclavicular, and axillary nodes normal.  Lab Results  Component Value Date   HGBA1C 6.1 10/22/2015  HGBA1C 6.5 03/22/2015   HGBA1C 6.4* 09/04/2014    Lab Results  Component Value Date   CREATININE 0.61 10/22/2015   CREATININE 0.69 11/04/2014   CREATININE 0.7 04/27/2014    Lab Results  Component Value Date   WBC 5.2 10/15/2013   HGB 13.7 10/15/2013   HCT 41.1 10/15/2013   PLT 193.0 10/15/2013   GLUCOSE 109* 10/22/2015   CHOL 107 10/22/2015   TRIG 88.0 10/22/2015     HDL 23.70* 10/22/2015   LDLDIRECT 57.0 10/22/2015   LDLCALC 65 10/22/2015   ALT 10 12/15/2015   AST 26 12/15/2015   NA 141 10/22/2015   K 5.0 10/22/2015   CL 106 10/22/2015   CREATININE 0.61 10/22/2015   BUN 10 10/22/2015   CO2 27 10/22/2015   TSH 2.81 10/22/2015   HGBA1C 6.1 10/22/2015   MICROALBUR 1.3 10/22/2015    No results found.  Assessment & Plan:   Problem List Items Addressed This Visit    Hepatic cirrhosis ([place])    Suggested by ultrasound , with ascites.  Etiology unclear but NASH suspected.  Splenomegaly also noted,  Doppler was negative for portal vein thrombosis. Serologies for autoimmune causes  Drawn.  Prescribing lasix for the reduction of ascites. ,  And referral to GI for evaluation under wAY.  She has had Hep A/B vaccines,  Will confirm with titers.  Advised to avoid tylenol and alcohol       Relevant Orders   ANA (Completed)   Anti-Smith antibody (Completed)   Ferritin (Completed)   Hepatic function panel (Completed)   Hepatitis B core antibody, total (Completed)   Hepatitis B surface antibody (Completed)   Hepatitis B surface antigen (Completed)   Hepatitis C antibody (Completed)   Iron and TIBC (Completed)   Hepatitis A Ab, Total (Completed)   CEA (Completed)   AFP tumor marker (Completed)    Other Visit Diagnoses    Long-term use of high-risk medication    -  Primary    Relevant Orders    Basic metabolic panel    Vitamin D deficiency        Relevant Orders    Vitamin D (25 hydroxy) (Completed)    Ascites        Relevant Orders    CA 125 (Completed)     A total of 25 minutes of face to face time was spent with patient more than half of which was spent in counselling about the above mentioned conditions  and coordination of care   I am having Ms. Kratt maintain her Vitamin D (Ergocalciferol), onetouch ultrasoft, cholecalciferol, zoster vaccine live (PF), glucose blood, furosemide, fluticasone, and lisinopril.  Meds ordered this  encounter  Medications  . lisinopril (PRINIVIL,ZESTRIL) 10 MG tablet    Sig: TAKE 1 TABLET (10 MG TOTAL) BY MOUTH DAILY.    Dispense:  90 tablet    Refill:  1    Medications Discontinued During This Encounter  Medication Reason  . lisinopril (PRINIVIL,ZESTRIL) 10 MG tablet Reorder    Follow-up: No Follow-up on file.   Crecencio Mc, MD

## 2015-12-15 NOTE — Patient Outreach (Signed)
[facility]) Care Management  12/15/2015  Suheyla Grumbling [place] 24-Oct-1945 [id number]   I have sent an e-mail to Virdell to request she follow up with [facility] to schedule a visit for the Link to Wellness program.    [name], RN, BA, [place], [place]:  [phone]  Fax:  [phone] E-mail: [email or web address] [address], [place], [place]  [address]

## 2015-12-16 LAB — AFP TUMOR MARKER: AFP-Tumor Marker: 3.2 ng/mL (ref <6.1)

## 2015-12-16 LAB — CA 125: CA 125: 1318 U/mL — AB (ref <35)

## 2015-12-16 LAB — ANTI-NUCLEAR AB-TITER (ANA TITER)

## 2015-12-16 LAB — HEPATITIS A ANTIBODY, TOTAL: Hep A Total Ab: NONREACTIVE

## 2015-12-16 LAB — HEPATITIS B CORE ANTIBODY, TOTAL: Hep B Core Total Ab: NONREACTIVE

## 2015-12-16 LAB — CEA: CEA: 1.3 ng/mL

## 2015-12-16 LAB — ANTI-SMITH ANTIBODY: ENA SM Ab Ser-aCnc: <1

## 2015-12-16 LAB — HEPATITIS B SURFACE ANTIBODY,QUALITATIVE: HEP B S AB: NEGATIVE

## 2015-12-16 LAB — HEPATITIS C ANTIBODY: HCV AB: NEGATIVE

## 2015-12-16 LAB — ANA: ANA: POSITIVE — AB

## 2015-12-16 LAB — HEPATITIS B SURFACE ANTIGEN: HEP B S AG: NEGATIVE

## 2015-12-16 NOTE — Assessment & Plan Note (Addendum)
Suggested by ultrasound , with ascites.  Etiology unclear but NASH suspected.  Splenomegaly also noted,  Doppler was negative for portal vein thrombosis. Serologies for autoimmune causes  Drawn.  Prescribing lasix for the reduction of ascites. ,  And referral to GI for evaluation under wAY.  She has had Hep A/B vaccines,  Will confirm with titers.  Advised to avoid tylenol and alcohol

## 2015-12-17 ENCOUNTER — Other Ambulatory Visit: Payer: Self-pay | Admitting: Internal Medicine

## 2015-12-17 ENCOUNTER — Encounter: Payer: Self-pay | Admitting: Internal Medicine

## 2015-12-17 DIAGNOSIS — R188 Other ascites: Secondary | ICD-10-CM

## 2015-12-17 DIAGNOSIS — R971 Elevated cancer antigen 125 [CA 125]: Secondary | ICD-10-CM

## 2015-12-20 ENCOUNTER — Ambulatory Visit (HOSPITAL_COMMUNITY): Payer: 59

## 2015-12-20 ENCOUNTER — Ambulatory Visit
Admission: RE | Admit: 2015-12-20 | Discharge: 2015-12-20 | Disposition: A | Discharge status: Home / Self Care | Payer: 59 | Source: Ambulatory Visit | Attending: Internal Medicine | Admitting: Internal Medicine

## 2015-12-20 ENCOUNTER — Telehealth: Payer: Self-pay | Admitting: Internal Medicine

## 2015-12-20 ENCOUNTER — Other Ambulatory Visit: Payer: Self-pay | Admitting: Internal Medicine

## 2015-12-20 DIAGNOSIS — K7460 Unspecified cirrhosis of liver: Secondary | ICD-10-CM | POA: Insufficient documentation

## 2015-12-20 DIAGNOSIS — R971 Elevated cancer antigen 125 [CA 125]: Secondary | ICD-10-CM | POA: Insufficient documentation

## 2015-12-20 DIAGNOSIS — R188 Other ascites: Secondary | ICD-10-CM | POA: Diagnosis not present

## 2015-12-20 DIAGNOSIS — K8020 Calculus of gallbladder without cholecystitis without obstruction: Secondary | ICD-10-CM | POA: Insufficient documentation

## 2015-12-20 DIAGNOSIS — C220 Liver cell carcinoma: Secondary | ICD-10-CM

## 2015-12-20 HISTORY — DX: Essential (primary) hypertension: I10

## 2015-12-20 LAB — POCT I-STAT CREATININE: CREATININE: 0.5 mg/dL (ref 0.44–1.00)

## 2015-12-20 MED ORDER — IOPAMIDOL (ISOVUE-300) INJECTION 61%
100.0000 mL | Freq: Once | INTRAVENOUS | Status: AC | PRN
  Administered 2015-12-20: 100 mL via INTRAVENOUS

## 2015-12-20 NOTE — Telephone Encounter (Signed)
Patient updated by phone with results of CT suggestive OF  hepatocellular carcinoma.  Occology referral placed .

## 2015-12-20 NOTE — Telephone Encounter (Signed)
[facility] called and confirmed results in [facility] printed for MD.

## 2015-12-21 ENCOUNTER — Encounter: Payer: Self-pay | Admitting: Internal Medicine

## 2015-12-22 ENCOUNTER — Other Ambulatory Visit: Payer: Self-pay | Admitting: Psychiatry

## 2015-12-22 ENCOUNTER — Encounter: Payer: Self-pay | Admitting: Obstetrics and Gynecology

## 2015-12-22 ENCOUNTER — Inpatient Hospital Stay: Payer: 59 | Attending: Obstetrics and Gynecology | Admitting: Obstetrics and Gynecology

## 2015-12-22 ENCOUNTER — Other Ambulatory Visit: Payer: Self-pay

## 2015-12-22 ENCOUNTER — Encounter: Payer: Self-pay | Admitting: Internal Medicine

## 2015-12-22 VITALS — BP 139/70 | HR 94 | Temp 97.0°F | Ht 66.0 in | Wt 221.8 lb

## 2015-12-22 DIAGNOSIS — Z6835 Body mass index (BMI) 35.0-35.9, adult: Secondary | ICD-10-CM | POA: Insufficient documentation

## 2015-12-22 DIAGNOSIS — R188 Other ascites: Secondary | ICD-10-CM | POA: Insufficient documentation

## 2015-12-22 DIAGNOSIS — J90 Pleural effusion, not elsewhere classified: Secondary | ICD-10-CM | POA: Diagnosis not present

## 2015-12-22 DIAGNOSIS — D259 Leiomyoma of uterus, unspecified: Secondary | ICD-10-CM | POA: Diagnosis not present

## 2015-12-22 DIAGNOSIS — D376 Neoplasm of uncertain behavior of liver, gallbladder and bile ducts: Secondary | ICD-10-CM | POA: Insufficient documentation

## 2015-12-22 DIAGNOSIS — K7460 Unspecified cirrhosis of liver: Secondary | ICD-10-CM | POA: Diagnosis not present

## 2015-12-22 DIAGNOSIS — K8020 Calculus of gallbladder without cholecystitis without obstruction: Secondary | ICD-10-CM | POA: Diagnosis not present

## 2015-12-22 DIAGNOSIS — Z803 Family history of malignant neoplasm of breast: Secondary | ICD-10-CM | POA: Insufficient documentation

## 2015-12-22 DIAGNOSIS — R140 Abdominal distension (gaseous): Secondary | ICD-10-CM | POA: Diagnosis not present

## 2015-12-22 DIAGNOSIS — I10 Essential (primary) hypertension: Secondary | ICD-10-CM | POA: Insufficient documentation

## 2015-12-22 DIAGNOSIS — Z79899 Other long term (current) drug therapy: Secondary | ICD-10-CM | POA: Insufficient documentation

## 2015-12-22 DIAGNOSIS — E119 Type 2 diabetes mellitus without complications: Secondary | ICD-10-CM | POA: Diagnosis not present

## 2015-12-22 DIAGNOSIS — I872 Venous insufficiency (chronic) (peripheral): Secondary | ICD-10-CM | POA: Diagnosis not present

## 2015-12-22 DIAGNOSIS — Z86010 Personal history of colonic polyps: Secondary | ICD-10-CM | POA: Diagnosis not present

## 2015-12-22 DIAGNOSIS — R971 Elevated cancer antigen 125 [CA 125]: Secondary | ICD-10-CM | POA: Insufficient documentation

## 2015-12-22 DIAGNOSIS — E669 Obesity, unspecified: Secondary | ICD-10-CM | POA: Insufficient documentation

## 2015-12-22 DIAGNOSIS — R920 Mammographic microcalcification found on diagnostic imaging of breast: Secondary | ICD-10-CM | POA: Insufficient documentation

## 2015-12-22 DIAGNOSIS — K766 Portal hypertension: Secondary | ICD-10-CM | POA: Insufficient documentation

## 2015-12-22 DIAGNOSIS — I8500 Esophageal varices without bleeding: Secondary | ICD-10-CM | POA: Insufficient documentation

## 2015-12-22 DIAGNOSIS — R160 Hepatomegaly, not elsewhere classified: Secondary | ICD-10-CM

## 2015-12-22 DIAGNOSIS — R162 Hepatomegaly with splenomegaly, not elsewhere classified: Secondary | ICD-10-CM | POA: Diagnosis not present

## 2015-12-22 DIAGNOSIS — R599 Enlarged lymph nodes, unspecified: Secondary | ICD-10-CM | POA: Insufficient documentation

## 2015-12-22 DIAGNOSIS — R978 Other abnormal tumor markers: Secondary | ICD-10-CM

## 2015-12-22 NOTE — Progress Notes (Signed)
  Oncology Nurse Navigator Documentation  Navigator Location: [facility] (12/22/15 1200)                                  Acuity: Level 2 (12/22/15 1200)   Acuity Level 2: Educational needs;Initial guidance, education and coordination as needed;Assistance expediting appointments;Ongoing guidance and education throughout treatment as needed (12/22/15 1200)     Time Spent with Patient: 45 (12/22/15 1200)   Chaperoned pelvic exam. MRI with contrast ordered. Will need to see Dr B after MRI next week.

## 2015-12-22 NOTE — Progress Notes (Signed)
Gynecologic Oncology Consult Visit   Referring Provider: Crecencio Mc, MD [address] [place], [place] [address] [phone]   Chief Concern: elevated CA125 and liver mass  Subjective:  Jennifer Ruiz is a 70 y.o. G1P1 female who is seen in consultation from Dr. Derrel Nip for elevated CA125 and liver mass and assess for ovarian cancer.  Ms. Lodhi began having symptoms of edema in April 2017 and the workup included the following:  12/10/2015 RUQ ultrasound: liver cirrhosis, gallstones, and ascites. Doppler revealed patent portal and hepatic veins. No evidence of thrombosis. Cirrhosis with evidence of portal hypertension, and splenomegaly.  12/20/2015 CT abdomen/pelvis Hepatic cirrhosis with an infiltrative heterogeneous enhancing mass centered in segment 4A suspicious for an infiltrative hepatocellular carcinoma. No associated biliary obstruction or portal vein thrombus. Recommend further evaluation with abdominal MRI without and with contrast.  Lower chest and upper abdominal diffuse adenopathy concerning for metastatic adenopathy.  Stigmata of portal hypertension with splenomegaly, short gastric varices, small gastro renal shunt, and large volume of abdominal and pelvic ascites.  Moderate left effusion with lingula and left lower lobe compressive atelectasis.  Incidental cholelithiasis  Degenerated small uterine fibroid measuring 3.1 cm; small atrophic uterus. No significant ovarian or adnexal abnormality.  She has no symptoms of abnormal vaginal bleeding, pelvic pain or family history of ovarian cancer. Her mother had early stage breast cancer in her 92's.   Problem List: Patient Active Problem List   Diagnosis Date Noted  . Liver mass 12/22/2015  . Hepatic cirrhosis ([place]) 12/12/2015  . Edema extremities 12/03/2015  . Abdominal distension 12/01/2015  . Venous insufficiency of both lower extremities 10/08/2015  . Mammographic microcalcification  03/12/2013  . Medicare annual wellness visit, subsequent 08/07/2012  . Personal history of colonic polyps 04/03/2012  . White coat hypertension 04/03/2012  . Diabetes mellitus type 2 in obese ([place]) 04/02/2012  . Obesity (BMI 30-39.9) 04/02/2012    Past Medical History: Past Medical History  Diagnosis Date  . Elevated blood pressure reading   . Diabetes mellitus without complication ([place])   . Breast lump in female 2014  . Hypertension     Past Surgical History: Past Surgical History  Procedure Laterality Date  . Shoulder surgery  2011    right , [facility]   . Breast lumpectomy    . Breast biopsy  1996  . Breast biopsy  2014    Calcifications - Byrnett    Past Gynecologic History:  Pap smears: no abnormal Paps per her report. Last pap 08/2012 Normal  Breast biopsy 09/09/2012 Negative   OB History:  OB History  Gravida Para Term Preterm AB SAB TAB Ectopic Multiple Living  1 1        1     # Outcome Date GA Lbr Len/2nd Weight Sex Delivery Anes PTL Lv  1 Para             Obstetric Comments  First Menstrual Period at age 70.  First pregnancy at age 54.    Family History: Family History  Problem Relation Age of Onset  . Cancer Mother 16    Breast Cancer  . Breast cancer Mother   . Asthma Father   . Diabetes Father     Social History: Social History   Social History  . Marital Status: Divorced    Spouse Name: N/A  . Number of Children: N/A  . Years of Education: N/A   Occupational History  . Not on file.   Social History Main Topics  .  Smoking status: Never Smoker   . Smokeless tobacco: Never Used  . Alcohol Use: No  . Drug Use: No  . Sexual Activity: Not on file   Other Topics Concern  . Not on file   Social History Narrative    Allergies: Allergies  Allergen Reactions  . Adhesive [Tape] Other (See Comments)    Blistering and redness    Current Medications: Current Outpatient Prescriptions  Medication Sig Dispense Refill  . cholecalciferol  (VITAMIN D) 1000 UNITS tablet Take 1,000 Units by mouth daily. Reported on 12/01/2015    . fluticasone (FLONASE) 50 MCG/ACT nasal spray Place 2 sprays into both nostrils daily. 16 g 6  . furosemide (LASIX) 20 MG tablet Take 1 tablet (20 mg total) by mouth daily. As needed for fluid retention 30 tablet 3  . glucose blood (BAYER CONTOUR NEXT TEST) test strip Check sugar daily Dx E11.9 100 each 3  . Lancets (ONETOUCH ULTRASOFT) lancets Use one time daily to scheck blood sugars   250.00 100 each 3  . lisinopril (PRINIVIL,ZESTRIL) 10 MG tablet TAKE 1 TABLET (10 MG TOTAL) BY MOUTH DAILY. 90 tablet 1  . Vitamin D, Ergocalciferol, (DRISDOL) 50000 UNITS CAPS Take 50,000 Units by mouth every 7 (seven) days. Reported on 12/01/2015    . zoster vaccine live, PF, (ZOSTAVAX) 09811 UNT/0.65ML injection Inject 19,400 Units into the skin once. (Patient not taking: Reported on 12/22/2015) 1 each 0   No current facility-administered medications for this visit.    Review of Systems General: no complaints  HEENT: no complaints  Lungs: cough  Cardiac: edema  GI: abdominal/pelvic discomfort. No n/v, but feels full early. No blood per rectum. Last colonoscopy 2 years ago and negative other than benign polyps  GU: no complaints  Musculoskeletal: no complaints  Extremities: no complaints  Skin: no complaints  Neuro: no complaints  Endocrine: no complaints  Psych: no complaints       Objective:  Physical Examination:  BP 139/70 mmHg  Pulse 94  Temp(Src) 97 F (36.1 C) (Tympanic)  Ht 5\' 6"  (1.676 m)  Wt 221 lb 12.5 oz (100.6 kg)  BMI 35.81 kg/m2   ECOG Performance Status: 1 - Symptomatic but completely ambulatory  General appearance: alert, cooperative and appears stated age HEENT:PERRLA, extra ocular movement intact and sclera clear, anicteric Lymph node survey: non-palpable, axillary, inguinal, supraclavicular Cardiovascular: regular rate and rhythm Respiratory: normal air entry, lungs clear to  auscultation Abdomen: distended but soft, nontender, hepatosplenomegaly and ascites present, no hernias or pelvic masses detected.  Back: inspection of back is normal Extremities: extremities normal, atraumatic, hose in place. Multiple varicosities Neurological exam reveals alert, oriented, normal speech, no focal findings or movement disorder noted.  Pelvic: exam chaperoned by nurse;  Vulva: normal appearing vulva with no masses, tenderness or lesions; Vagina: normal vagina; Adnexa: normal adnexa in size, nontender and no masses; Uterus: uterus is normal size, shape, consistency and nontender; Cervix: no lesions; Rectal: normal rectal, no masses    Lab Review Labs on site today: Lab Results  Component Value Date   CA125 1318* 12/15/2015   Lab Results  Component Value Date   CEA 1.3 12/15/2015   AFP normal.  Hepatitis B and C serologies negative  Lab Results  Component Value Date   WBC 5.2 10/15/2013   HGB 13.7 10/15/2013   HCT 41.1 10/15/2013   MCV 88.3 10/15/2013   PLT 193.0 10/15/2013   Lab Results  Component Value Date   ALT 10 12/15/2015  AST 26 12/15/2015   ALKPHOS 81 12/15/2015   BILITOT 1.5* 12/15/2015     Radiologic Imaging: Reviewed CT scans with patient and family    Assessment:  ARIONNE PHARRIS is a 70 y.o. female diagnosed with elevated CA125 and liver mass most likely consistent with hepatocellular carcinoma based on radiologic impression. Degenerating uterine leiomyoma. Essentially negative gynecologic exam and radiologic findings make metastatic gynecologic primary unlikely. Medical co-morbidities complicating care: HTN, diabetes and obesity.  Plan:   Problem List Items Addressed This Visit      Other   Liver mass - Primary    Other Visit Diagnoses    Elevated tumor markers        Ascites           We discussed options for management and I discussed with Dr. Rogue Bussing next steps to evaluate the liver lesion. He recommended MRI of the liver  with and without contrast. We will order this test for Ms. Skarzynski as well as referral to Medical Oncology with Dr. Rogue Bussing next week. The patient and her family agreed with this plan of care.   Gillis Ends, MD    CC:  Crecencio Mc, MD [place], [place] [address] [phone]

## 2015-12-22 NOTE — Progress Notes (Signed)
Patient here for referral secondary to CT results.

## 2015-12-23 ENCOUNTER — Ambulatory Visit
Admission: RE | Admit: 2015-12-23 | Discharge: 2015-12-23 | Disposition: A | Discharge status: Home / Self Care | Payer: 59 | Source: Ambulatory Visit | Attending: Obstetrics and Gynecology | Admitting: Obstetrics and Gynecology

## 2015-12-23 DIAGNOSIS — K766 Portal hypertension: Secondary | ICD-10-CM | POA: Insufficient documentation

## 2015-12-23 DIAGNOSIS — K769 Liver disease, unspecified: Secondary | ICD-10-CM | POA: Insufficient documentation

## 2015-12-23 DIAGNOSIS — K8020 Calculus of gallbladder without cholecystitis without obstruction: Secondary | ICD-10-CM | POA: Diagnosis not present

## 2015-12-23 DIAGNOSIS — R188 Other ascites: Secondary | ICD-10-CM | POA: Diagnosis not present

## 2015-12-23 DIAGNOSIS — R160 Hepatomegaly, not elsewhere classified: Secondary | ICD-10-CM | POA: Diagnosis not present

## 2015-12-23 DIAGNOSIS — K7460 Unspecified cirrhosis of liver: Secondary | ICD-10-CM | POA: Insufficient documentation

## 2015-12-23 DIAGNOSIS — J90 Pleural effusion, not elsewhere classified: Secondary | ICD-10-CM | POA: Insufficient documentation

## 2015-12-23 MED ORDER — GADOXETATE DISODIUM 0.25 MMOL/ML IV SOLN
10.0000 mL | Freq: Once | INTRAVENOUS | Status: AC | PRN
  Administered 2015-12-23: 10 mL via INTRAVENOUS

## 2015-12-26 ENCOUNTER — Encounter: Payer: Self-pay | Admitting: Obstetrics and Gynecology

## 2015-12-27 ENCOUNTER — Other Ambulatory Visit: Payer: 59

## 2015-12-30 ENCOUNTER — Encounter: Payer: Self-pay | Admitting: Internal Medicine

## 2015-12-30 ENCOUNTER — Ambulatory Visit: Payer: 59 | Admitting: Internal Medicine

## 2015-12-31 ENCOUNTER — Inpatient Hospital Stay (HOSPITAL_BASED_OUTPATIENT_CLINIC_OR_DEPARTMENT_OTHER): Payer: 59 | Admitting: Internal Medicine

## 2015-12-31 ENCOUNTER — Inpatient Hospital Stay: Payer: 59

## 2015-12-31 VITALS — BP 133/76 | HR 87 | Temp 97.6°F | Resp 18 | Wt 222.0 lb

## 2015-12-31 DIAGNOSIS — K766 Portal hypertension: Secondary | ICD-10-CM

## 2015-12-31 DIAGNOSIS — Z6835 Body mass index (BMI) 35.0-35.9, adult: Secondary | ICD-10-CM

## 2015-12-31 DIAGNOSIS — D376 Neoplasm of uncertain behavior of liver, gallbladder and bile ducts: Secondary | ICD-10-CM

## 2015-12-31 DIAGNOSIS — D259 Leiomyoma of uterus, unspecified: Secondary | ICD-10-CM

## 2015-12-31 DIAGNOSIS — R188 Other ascites: Secondary | ICD-10-CM

## 2015-12-31 DIAGNOSIS — K8020 Calculus of gallbladder without cholecystitis without obstruction: Secondary | ICD-10-CM | POA: Diagnosis not present

## 2015-12-31 DIAGNOSIS — Z86010 Personal history of colonic polyps: Secondary | ICD-10-CM | POA: Diagnosis not present

## 2015-12-31 DIAGNOSIS — I872 Venous insufficiency (chronic) (peripheral): Secondary | ICD-10-CM

## 2015-12-31 DIAGNOSIS — Z79899 Other long term (current) drug therapy: Secondary | ICD-10-CM | POA: Diagnosis not present

## 2015-12-31 DIAGNOSIS — Z803 Family history of malignant neoplasm of breast: Secondary | ICD-10-CM | POA: Diagnosis not present

## 2015-12-31 DIAGNOSIS — C801 Malignant (primary) neoplasm, unspecified: Secondary | ICD-10-CM | POA: Insufficient documentation

## 2015-12-31 DIAGNOSIS — E669 Obesity, unspecified: Secondary | ICD-10-CM

## 2015-12-31 DIAGNOSIS — R920 Mammographic microcalcification found on diagnostic imaging of breast: Secondary | ICD-10-CM

## 2015-12-31 DIAGNOSIS — J90 Pleural effusion, not elsewhere classified: Secondary | ICD-10-CM

## 2015-12-31 DIAGNOSIS — R160 Hepatomegaly, not elsewhere classified: Secondary | ICD-10-CM

## 2015-12-31 DIAGNOSIS — R599 Enlarged lymph nodes, unspecified: Secondary | ICD-10-CM

## 2015-12-31 DIAGNOSIS — E119 Type 2 diabetes mellitus without complications: Secondary | ICD-10-CM

## 2015-12-31 DIAGNOSIS — R162 Hepatomegaly with splenomegaly, not elsewhere classified: Secondary | ICD-10-CM

## 2015-12-31 DIAGNOSIS — R971 Elevated cancer antigen 125 [CA 125]: Secondary | ICD-10-CM | POA: Diagnosis not present

## 2015-12-31 DIAGNOSIS — K7460 Unspecified cirrhosis of liver: Secondary | ICD-10-CM

## 2015-12-31 DIAGNOSIS — R140 Abdominal distension (gaseous): Secondary | ICD-10-CM

## 2015-12-31 DIAGNOSIS — I10 Essential (primary) hypertension: Secondary | ICD-10-CM

## 2015-12-31 DIAGNOSIS — I8500 Esophageal varices without bleeding: Secondary | ICD-10-CM

## 2015-12-31 LAB — COMPREHENSIVE METABOLIC PANEL
ALBUMIN: 3.4 g/dL — AB (ref 3.5–5.0)
ALT: 13 U/L — ABNORMAL LOW (ref 14–54)
AST: 32 U/L (ref 15–41)
Alkaline Phosphatase: 90 U/L (ref 38–126)
Anion gap: 7 (ref 5–15)
BUN: 10 mg/dL (ref 6–20)
CHLORIDE: 102 mmol/L (ref 101–111)
CO2: 28 mmol/L (ref 22–32)
Calcium: 8.6 mg/dL — ABNORMAL LOW (ref 8.9–10.3)
Creatinine, Ser: 0.54 mg/dL (ref 0.44–1.00)
GFR calc Af Amer: >60 mL/min (ref >60)
GFR calc non Af Amer: >60 mL/min (ref >60)
GLUCOSE: 114 mg/dL — AB (ref 65–99)
POTASSIUM: 3.7 mmol/L (ref 3.5–5.1)
Sodium: 137 mmol/L (ref 135–145)
Total Bilirubin: 1.8 mg/dL — ABNORMAL HIGH (ref 0.3–1.2)
Total Protein: 6.8 g/dL (ref 6.5–8.1)

## 2015-12-31 LAB — CBC WITH DIFFERENTIAL/PLATELET
BASOS ABS: 0 10*3/uL (ref 0–0.1)
BASOS PCT: 1 %
EOS PCT: 1 %
Eosinophils Absolute: 0 10*3/uL (ref 0–0.7)
HEMATOCRIT: 37.2 % (ref 35.0–47.0)
Hemoglobin: 12.4 g/dL (ref 12.0–16.0)
Lymphocytes Relative: 21 %
Lymphs Abs: 0.8 10*3/uL — ABNORMAL LOW (ref 1.0–3.6)
MCH: 27.6 pg (ref 26.0–34.0)
MCHC: 33.3 g/dL (ref 32.0–36.0)
MCV: 82.9 fL (ref 80.0–100.0)
MONO ABS: 0.4 10*3/uL (ref 0.2–0.9)
Monocytes Relative: 11 %
NEUTROS ABS: 2.4 10*3/uL (ref 1.4–6.5)
Neutrophils Relative %: 66 %
PLATELETS: 162 10*3/uL (ref 150–440)
RBC: 4.49 MIL/uL (ref 3.80–5.20)
RDW: 16.4 % — AB (ref 11.5–14.5)
WBC: 3.7 10*3/uL (ref 3.6–11.0)

## 2015-12-31 LAB — PROTIME-INR
INR: 1.27
PROTHROMBIN TIME: 16 s — AB (ref 11.4–15.2)

## 2015-12-31 LAB — APTT: aPTT: 38 seconds — ABNORMAL HIGH (ref 24–36)

## 2015-12-31 NOTE — Progress Notes (Signed)
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  female with no significant past medical problems noted to have swelling in the ankles in April 2017. She is also noted to have progressive swelling of the abdomen the last few months- this led to further workup that showed a large 10 x 10 cm lesion in the liver; liver was also noted to be cirrhotic.  Patient had Elevated ca 125; subsequently she was referred to GYN oncology. GYN evaluation was negative for any GYN primary. Patient had a liver MRI/ and is here to review further workup  Patient complains of poor appetite. Early satiety. No nausea no vomiting. Swelling in the legs. No pain. Denies any skin itch.   ROS: A complete 10 point review of system is done which is negative except mentioned above in history of present illness  MEDICAL HISTORY:  Past Medical History:  Diagnosis Date  . Breast lump in female 2014  . Diabetes mellitus without complication ([place])   . Elevated blood pressure reading   . Hypertension     SURGICAL HISTORY: Past Surgical History:  Procedure Laterality Date  . BREAST BIOPSY  1996  . BREAST BIOPSY  2014   Calcifications - [facility]nett  . BREAST LUMPECTOMY    . SHOULDER SURGERY  2011   right , [facility]     SOCIAL HISTORY: no smoking/ alcohol.Gillermina Hu.. Self..  Social History   Social History  . Marital status: Divorced     Spouse name: N/A  . Number of children: N/A  . Years of education: N/A   Occupational History  . Not on file.   Social History Main Topics  . Smoking status: Never Smoker  . Smokeless tobacco: Never Used  . Alcohol use No  . Drug use: No  . Sexual activity: Not on file   Other Topics Concern  . Not on file   Social History Narrative  . No narrative on file    FAMILY HISTORY:  Family History  Problem Relation Age of Onset  . Cancer Mother 54    Breast Cancer  . Breast cancer Mother   . Asthma Father   . Diabetes Father     ALLERGIES:  is allergic to adhesive [tape].  MEDICATIONS:  Current Outpatient Prescriptions  Medication Sig Dispense Refill  . cholecalciferol (VITAMIN D) 1000 UNITS tablet Take 1,000 Units by mouth daily. Reported on 12/01/2015    . fluticasone (FLONASE) 50 MCG/ACT nasal spray Place 2 sprays into both nostrils daily. 16 g 6  . furosemide (LASIX) 20 MG tablet Take 1 tablet (20 mg total) by mouth daily. As needed for fluid retention 30 tablet 3  . glucose blood (BAYER CONTOUR NEXT TEST) test strip Check sugar daily Dx E11.9 100 each 3  . Lancets (ONETOUCH ULTRASOFT) lancets Use one time daily to scheck blood sugars  250.00 100 each 3  . lisinopril (PRINIVIL,ZESTRIL) 10 MG tablet TAKE 1 TABLET (10 MG TOTAL) BY MOUTH DAILY. 90 tablet 1  . Vitamin D, Ergocalciferol, (DRISDOL) 50000 UNITS CAPS Take 50,000 Units by mouth every 7 (seven) days. Reported on 12/01/2015    . zoster vaccine live, PF, (ZOSTAVAX) 60454 UNT/0.65ML injection Inject 19,400 Units into the skin once. 1 each 0   No current facility-administered medications for this visit.       Marland Kitchen  PHYSICAL EXAMINATION: ECOG PERFORMANCE STATUS: 1 - Symptomatic but completely ambulatory  Vitals:   12/31/15 1113  BP: 133/76  Pulse: 87  Resp: 18  Temp: 97.6 F (36.4 C)   Filed Weights   12/31/15 1113  Weight: 222 lb (100.7 kg)    GENERAL: Well-nourished well-developed; Alert, no  distress and comfortable.   Accompanied by daughter and son-in-law. She is walking herself. EYES: no pallor or icterus OROPHARYNX: no thrush or ulceration; good dentition  NECK: supple, no masses felt LYMPH:  no palpable lymphadenopathy in the cervical, axillary or inguinal regions LUNGS: clear to auscultation and  No wheeze or crackles HEART/CVS: regular rate & rhythm and no murmurs; No lower extremity edema ABDOMEN: abdomen soft, non-tender and normal bowel sounds; positive for distention. Positive for hepatomegaly and spelled megaly. Positive for ascites. Musculoskeletal:no cyanosis of digits and no clubbing  PSYCH: alert & oriented x 3 with fluent speech NEURO: no focal motor/sensory deficits SKIN:  no rashes or significant lesions  LABORATORY DATA:  I have reviewed the data as listed Lab Results  Component Value Date   WBC 3.7 12/31/2015   HGB 12.4 12/31/2015   HCT 37.2 12/31/2015   MCV 82.9 12/31/2015   PLT 162 12/31/2015    Recent Labs  10/22/15 0755 12/15/15 1159 12/20/15 1122 12/31/15 1203  NA 141  --   --  137  K 5.0  --   --  3.7  CL 106  --   --  102  CO2 27  --   --  28  GLUCOSE 109*  --   --  114*  BUN 10  --   --  10  CREATININE 0.61  --  0.50 0.54  CALCIUM 8.9  --   --  8.6*  GFRNONAA  --   --   --  >60  GFRAA  --   --   --  >60  PROT 5.7* 6.3  --  6.8  ALBUMIN 3.4* 3.3*  --  3.4*  AST 25 26  --  32  ALT 11 10  --  13*  ALKPHOS 69 81  --  90  BILITOT 1.2 1.5*  --  1.8*  BILIDIR  --  0.5*  --   --     RADIOGRAPHIC STUDIES: I have personally reviewed the radiological images as listed and agreed with the findings in the report. Mr Abdomen W Wo Contrast  Result Date: 12/23/2015 CLINICAL DATA:  Liver mass. Bloating. Diabetes. Hypertension. History of cirrhosis. EXAM: MRI ABDOMEN WITHOUT AND WITH CONTRAST TECHNIQUE: Multiplanar multisequence MR imaging of the abdomen was performed both before and after the administration of intravenous contrast.  CONTRAST:  10.0 ml Eovist, a mixed extracellular and hepatocyte specific contrast agent. COMPARISON:  12/20/2015 CT.  No prior MR. FINDINGS: Mild to moderate motion degradation. Exam also degraded by patient body habitus. Lower chest:  Left-sided pleural effusion.  Normal heart size. Hepatobiliary: Moderate cirrhosis. Hepatic dome mass is again identified. Measures 9.6 x 8.6 cm transverse on  image 43/series 3. 5.3 cm craniocaudal on image 16/series 2. Mildly T2 hyperintense and T1 hypo intense. Demonstrates arterial phase homogeneous post-contrast enhancement image 19/series 6. Relatively mildly hyper enhancing on more delayed post-contrast images. Although this is centered in segment 4A, extends into the right lobe of the liver, especially posterior to the IVC on image 9/ series 12. A separate 11 mm right hepatic lobe lesion image 48/series 9 and image 15/series 2 is indeterminate secondary to motion but favored to represent a hemangioma, given T2 hyperintensity. Small gallstones without acute cholecystitis or biliary duct dilatation. Pancreas: Grossly normal, without duct dilatation. Spleen: Splenomegaly, 18.5 cm craniocaudal. Adrenals/Urinary Tract: Normal adrenal glands. Normal kidneys, without hydronephrosis. Stomach/Bowel: Normal stomach and abdominal bowel loops. Vascular/Lymphatic: Normal caliber of the aorta and branch vessels. No splenic artery aneurysm. The left portal vein is not well opacified, including on image 27 and 24/ series 5. Hepatic veins are not well evaluated. Portal venous hypertension, including periesophageal varices. Abdominal adenopathy, including a porta hepatis 2.1 cm node on image 43/series 7. Right cardiophrenic angle adenopathy including at 12 mm on image 17/ series 5. Other: Moderate abdominal ascites. Musculoskeletal: No acute osseous abnormality. IMPRESSION: 1. Motion and patient body habitus degradation. 2. Infiltrative hepatic dome mass (centered in segment 4A with extension  into right lobe ). Given concurrent cirrhosis, most consistent with hepatocellular carcinoma. Left portal vein involvement is favored. Lack of visualization secondary to compression felt less likely. 3. Portal venous hypertension and ascites. 4. Abdominal adenopathy which is indeterminate in the setting of cirrhosis. Favored to be reactive. Nodal metastasis felt less likely. 5. Persistent left pleural effusion. 6. Small right hepatic lobe lesion is favored to represent a hemangioma but suboptimally evaluated secondary to small size and study limitations. Recommend attention on follow-up. 7. Cholelithiasis. Electronically Signed   By: Abigail Miyamoto M.D.   On: 12/23/2015 10:54   Ct Abdomen Pelvis W Contrast  Result Date: 12/20/2015 CLINICAL DATA:  Hepatic cirrhosis, ascites, splenomegaly, elevated CA 125 EXAM: CT ABDOMEN AND PELVIS WITH CONTRAST TECHNIQUE: Multidetector CT imaging of the abdomen and pelvis was performed using the standard protocol following bolus administration of intravenous contrast. CONTRAST:  115mL ISOVUE-300 IOPAMIDOL (ISOVUE-300) INJECTION 61% COMPARISON:  12/10/2015 ultrasound FINDINGS: Lower chest: Right lower lobe is clear. Moderate dependent left effusion with lingula and left lower lobe compressive atelectasis. Normal heart size. No pericardial effusion. Anterior pericardial and paraesophageal mild adenopathy present. Pericardial lymph node has a short axis measurement of 14 mm. Paraesophageal lymph node has a short axis of 12 mm. Hepatobiliary: Diffusely heterogeneous liver with a nodular surface compatible with underlying hepatic cirrhosis. Heterogeneous irregular and diffusely enhancing infiltrative mass centered in segment 4A of the liver, roughly measuring 10.3 x 9.2 cm, image 15. This is concerning for an infiltrative hepatocellular carcinoma. Recommend further evaluation with liver MRI without and with contrast. No associated biliary dilatation. Portal vein remains patent.  Gallbladder is underdistended but small calcified gallstones noted. Common bile duct is nondilated. Gastrohepatic ligament and periportal adenopathy noted. Largest periportal lymph node measures 4.0 x 2.1 cm, image 28. Pancreas: No mass, inflammatory changes, or other significant abnormality. Spleen: Spleen is enlarged measuring 19.5 cm in craniocaudal length. Splenic vein is patent but diffusely dilated. Tortuous enhancing vessels in the splenic hilum and posterior to the stomach compatible with short gastric varices. Small gastro renal shunt also noted. Large volume diffuse abdominal ascites and slight mesenteric vascular congestion present. Diffuse body anasarca noted. Adrenals/Urinary Tract: No masses identified. No evidence of  hydronephrosis. Stomach/Bowel: No evidence of obstruction, inflammatory process, or abnormal fluid collections. Vascular/Lymphatic: Lower chest and upper abdominal adenopathy as described above concerning for metastatic adenopathy related to the large infiltrative liver mass. Minor aortoiliac atherosclerosis. Negative for aneurysm. No retroperitoneal abnormality or hemorrhage. Reproductive: Uterus is small and atrophic. Degenerated partially calcified exophytic right fundal fibroid noted measures 3.1 cm, image 73. Ovaries are symmetric and normal in size. No definite adnexal mass. Other: Large volume of ascites extends into the pelvis. No inguinal abnormality or hernia. Intact abdominal wall. Musculoskeletal: Degenerative changes of the lumbar spine. No acute compression fracture. Unilateral left L5 pars defect noted. Lower lumbar facet arthropathy also evident. No acute osseous finding. IMPRESSION: Hepatic cirrhosis with an infiltrative heterogeneous enhancing mass centered in segment 4A suspicious for an infiltrative hepatocellular carcinoma. No associated biliary obstruction or portal vein thrombus. Recommend further evaluation with abdominal MRI without and with contrast. Lower chest  and upper abdominal diffuse adenopathy concerning for metastatic adenopathy. Stigmata of portal hypertension with splenomegaly, short gastric varices, small gastro renal shunt, and large volume of abdominal and pelvic ascites. Moderate left effusion with lingula and left lower lobe compressive atelectasis. Incidental cholelithiasis Degenerated small uterine fibroid. No significant ovarian or adnexal abnormality. These results will be called to the ordering clinician or representative by the Radiologist Assistant, and communication documented in the PACS or zVision Dashboard. Electronically Signed   By: Jerilynn Mages.  Shick M.D.   On: 12/20/2015 14:12   [place] Art/ven Flow Abd Pelv Doppler  Result Date: 12/10/2015 CLINICAL DATA:  70 year old female with new onset abdominal distension and ascites. EXAM: DUPLEX ULTRASOUND OF LIVER TECHNIQUE: Color and duplex Doppler ultrasound was performed to evaluate the hepatic in-flow and out-flow vessels. COMPARISON:  Abdominal ultrasound 12/10/2015 FINDINGS: Portal Vein Velocities Main: 18-23 cm/sec with normal hepatopetal directional flow. The main portal vein is also mildly dilated at 1.2 cm. Right:  29 cm/sec with normal hepatopetal directional flow. Left:  15 cm/sec with normal hepatopetal directional flow. Hepatic Vein Velocities Right:  75 cm/sec Middle:  31 cm/sec Left:  54 cm/sec Hepatic Artery Velocity:  257 cm/sec Splenic Vein Velocity:  31 cm/sec Varices: None visualized Ascites: Moderate perihepatic ascites. Other: Splenomegaly. The spleen measures 20 x 21 x 7 cm for a total volume of 1587 cubic cm. Normal splenic volume is up to 411 cubic cm. Nodular liver contour with echogenic and coarsened hepatic parenchyma. No discrete lesions identified. IMPRESSION: 1. Patent portal and hepatic veins.  No evidence of thrombosis. 2. Cirrhosis with evidence of portal hypertension including dilation of the main portal vein, sluggish but antegrade flow in the portal veins, splenomegaly and  ascites. 3. Moderate volume ascites. 4. Splenomegaly. Signed, Criselda Peaches, MD [facility] Electronically Signed   By: Jacqulynn Cadet M.D.   On: 12/10/2015 13:41   US Abdomen Limited Ruq  Result Date: 12/10/2015 CLINICAL DATA:  Abdominal distention. EXAM: US ABDOMEN LIMITED - RIGHT UPPER QUADRANT COMPARISON:  None. FINDINGS: Gallbladder: Multiple stones are identified within the gallbladder. These measure up to 5.7 mm. The gallbladder wall is thickened measuring 4.3 mm. Negative sonographic Murphy's sign. Common bile duct: Diameter: 5.1 mm Liver: The liver appears diffusely echogenic with a nodular contour. There is a large volume of ascites noted. IMPRESSION: 1. Morphologic features of the liver suggestive of cirrhosis. 2. Gallstones. 3. Ascites Abdominal distension Please select the correct template based on the type of Limited Abdominal US exam - i.e. RUQ, Ascites, Appendix, Pylorus, etc Electronically  Signed   By: Kerby Moors M.D.   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 Recommend further imaging including a PET scan. Recommend paracentesis-diagnostic/therapeutic. Understands that she will probably need a repeat biopsy with the paracentesis is nondiagnostic. Also reviewed at the tumor conference.  # Discussed with the patient the potential- etiologies include hepatocellular cancer versus adenocarcinoma. Check CA-19-9. CBC CMP; PT PTT  # Ascites/cirrhosis- decompensated. As above; continue Lasix/potassium. Hepatitis workup negative  # Discussed that if patient is interested I would be open for a second opinion tertiary center [facility]- Dr. Mariah Milling; Dr.Strickler].   # Patient will tentatively follow-up with me in approximately 1 week.   I reviewed the images myself and with the patient and family  in detail.   Thank you Dr.Secord & Dr.Tullo for allowing me to participate in the care of your pleasant patient. Please do not hesitate to contact me with questions or concerns in the interim.    I reviewed the images myself and with the patient and family in detail. A copy of this report was given.  All questions were answered. The patient knows to call the clinic with any problems, questions or concerns.       Cammie Sickle, MD 12/31/2015 6:09 PM

## 2015-12-31 NOTE — Assessment & Plan Note (Signed)
10 x 10 cm enhancing Mass in the liver on the CT scan MRI.- highly suspicious for malignancy. Recommend further imaging including a PET scan. Recommend paracentesis-diagnostic/therapeutic. Understands that she will probably need a repeat biopsy with the paracentesis is nondiagnostic. Also reviewed at the tumor conference.  # Discussed with the patient the potential- etiologies include hepatocellular cancer versus adenocarcinoma. Check CA-19-9. CBC CMP; PT PTT  # Ascites/cirrhosis- decompensated. As above; continue Lasix/potassium. Hepatitis workup negative  # Discussed that if patient is interested I would be open for a second opinion tertiary center [facility]- Dr. Mariah Milling; Dr.Strickler].   # Patient will tentatively follow-up with me in approximately 1 week.   I reviewed the images myself and with the patient and family in detail.   Thank you Dr.Secord & Dr.Tullo for allowing me to participate in the care of your pleasant patient. Please do not hesitate to contact me with questions or concerns in the interim.

## 2015-12-31 NOTE — Progress Notes (Signed)
Patient here today as new evaluation regarding liver cancer.  Referred by Dr. Theora Gianotti.  PCP Dr. Derrel Nip.

## 2016-01-01 LAB — CANCER ANTIGEN 19-9: CA 19-9: 16 U/mL (ref 0–35)

## 2016-01-01 LAB — CANCER ANTIGEN 27.29: CA 27.29: 35.7 U/mL (ref 0.0–38.6)

## 2016-01-04 ENCOUNTER — Ambulatory Visit
Admission: RE | Admit: 2016-01-04 | Discharge: 2016-01-04 | Disposition: A | Discharge status: Home / Self Care | Payer: 59 | Source: Ambulatory Visit | Attending: Internal Medicine | Admitting: Internal Medicine

## 2016-01-04 DIAGNOSIS — R160 Hepatomegaly, not elsewhere classified: Secondary | ICD-10-CM | POA: Diagnosis not present

## 2016-01-04 DIAGNOSIS — R932 Abnormal findings on diagnostic imaging of liver and biliary tract: Secondary | ICD-10-CM | POA: Diagnosis not present

## 2016-01-04 DIAGNOSIS — R188 Other ascites: Secondary | ICD-10-CM | POA: Insufficient documentation

## 2016-01-04 DIAGNOSIS — Z1231 Encounter for screening mammogram for malignant neoplasm of breast: Secondary | ICD-10-CM | POA: Diagnosis not present

## 2016-01-04 LAB — AMYLASE, BODY FLUID: AMYLASE FL: 17 U/L

## 2016-01-04 LAB — BODY FLUID CELL COUNT WITH DIFFERENTIAL
EOS FL: 0 %
LYMPHS FL: 67 %
MONOCYTE-MACROPHAGE-SEROUS FLUID: 33 %
NEUTROPHIL FLUID: 0 %
WBC FLUID: 543 uL

## 2016-01-04 LAB — LACTATE DEHYDROGENASE, PLEURAL OR PERITONEAL FLUID: LD, Fluid: 52 U/L — ABNORMAL HIGH (ref 3–23)

## 2016-01-04 LAB — GLUCOSE, SEROUS FLUID: Glucose, Fluid: 115 mg/dL

## 2016-01-04 LAB — ALBUMIN, FLUID (OTHER): Albumin, Fluid: 1.5 g/dL

## 2016-01-05 LAB — PROTEIN, BODY FLUID

## 2016-01-05 LAB — MISC LABCORP TEST (SEND OUT): LabCorp test name: 19588

## 2016-01-05 LAB — CYTOLOGY - NON PAP

## 2016-01-05 LAB — TRIGLYCERIDES, BODY FLUIDS: Triglycerides, Fluid: 96 mg/dL

## 2016-01-06 ENCOUNTER — Telehealth: Payer: Self-pay | Admitting: *Deleted

## 2016-01-06 MED ORDER — ALPRAZOLAM 1 MG PO TABS: ORAL_TABLET | ORAL | 0 refills | Status: DC

## 2016-01-06 NOTE — Telephone Encounter (Signed)
Called to request a xanax be ordered for her to take if needed for the PET scan. Not sure if she will be able to tolerate bing in PET machine. Jodi in [facility] suggested she have one on hand

## 2016-01-07 ENCOUNTER — Ambulatory Visit
Admission: RE | Admit: 2016-01-07 | Discharge: 2016-01-07 | Disposition: A | Discharge status: Home / Self Care | Payer: 59 | Source: Ambulatory Visit | Attending: Internal Medicine | Admitting: Internal Medicine

## 2016-01-07 ENCOUNTER — Other Ambulatory Visit: Payer: Self-pay | Admitting: Internal Medicine

## 2016-01-07 DIAGNOSIS — Z1231 Encounter for screening mammogram for malignant neoplasm of breast: Secondary | ICD-10-CM | POA: Insufficient documentation

## 2016-01-07 DIAGNOSIS — Z1239 Encounter for other screening for malignant neoplasm of breast: Secondary | ICD-10-CM

## 2016-01-08 LAB — BODY FLUID CULTURE: Culture: NO GROWTH

## 2016-01-10 ENCOUNTER — Other Ambulatory Visit: Payer: Self-pay | Admitting: Pharmacist

## 2016-01-10 ENCOUNTER — Ambulatory Visit
Admission: RE | Admit: 2016-01-10 | Discharge: 2016-01-10 | Disposition: A | Discharge status: Home / Self Care | Payer: 59 | Source: Ambulatory Visit | Attending: Internal Medicine | Admitting: Internal Medicine

## 2016-01-10 DIAGNOSIS — K7460 Unspecified cirrhosis of liver: Secondary | ICD-10-CM | POA: Diagnosis not present

## 2016-01-10 DIAGNOSIS — J90 Pleural effusion, not elsewhere classified: Secondary | ICD-10-CM | POA: Diagnosis not present

## 2016-01-10 DIAGNOSIS — I700 Atherosclerosis of aorta: Secondary | ICD-10-CM | POA: Insufficient documentation

## 2016-01-10 DIAGNOSIS — K8020 Calculus of gallbladder without cholecystitis without obstruction: Secondary | ICD-10-CM | POA: Diagnosis not present

## 2016-01-10 DIAGNOSIS — C801 Malignant (primary) neoplasm, unspecified: Secondary | ICD-10-CM | POA: Diagnosis present

## 2016-01-10 DIAGNOSIS — R160 Hepatomegaly, not elsewhere classified: Secondary | ICD-10-CM | POA: Diagnosis present

## 2016-01-10 DIAGNOSIS — K766 Portal hypertension: Secondary | ICD-10-CM | POA: Insufficient documentation

## 2016-01-10 DIAGNOSIS — D259 Leiomyoma of uterus, unspecified: Secondary | ICD-10-CM | POA: Diagnosis not present

## 2016-01-10 DIAGNOSIS — R188 Other ascites: Secondary | ICD-10-CM | POA: Insufficient documentation

## 2016-01-10 DIAGNOSIS — R162 Hepatomegaly with splenomegaly, not elsewhere classified: Secondary | ICD-10-CM | POA: Insufficient documentation

## 2016-01-10 HISTORY — DX: Hepatomegaly, not elsewhere classified: R16.0

## 2016-01-10 HISTORY — DX: Other ascites: R18.8

## 2016-01-10 LAB — GLUCOSE, CAPILLARY: Glucose-Capillary: 95 mg/dL (ref 65–99)

## 2016-01-10 MED ORDER — FLUDEOXYGLUCOSE F - 18 (FDG) INJECTION
11.8050 | Freq: Once | INTRAVENOUS | Status: AC | PRN
  Administered 2016-01-10: 11.805 via INTRAVENOUS

## 2016-01-10 NOTE — Patient Outreach (Signed)
Called patient to schedule Link to Wellness followup visit as patient has not been seen in 2017.  Left voicemail to return call.  Will followup via telephone.

## 2016-01-11 ENCOUNTER — Other Ambulatory Visit: Payer: Self-pay | Admitting: Pharmacist

## 2016-01-11 DIAGNOSIS — E669 Obesity, unspecified: Principal | ICD-10-CM

## 2016-01-11 DIAGNOSIS — E1169 Type 2 diabetes mellitus with other specified complication: Secondary | ICD-10-CM

## 2016-01-11 NOTE — Patient Outreach (Signed)
Called patient to schedule appointment for Employee Sponsored Link to Wellness program.  Was able to contact patient and appointment was made for next week

## 2016-01-12 LAB — PH, BODY FLUID: PH, BODY FLUID: 7.9

## 2016-01-13 ENCOUNTER — Telehealth: Payer: Self-pay

## 2016-01-13 ENCOUNTER — Other Ambulatory Visit: Payer: Self-pay

## 2016-01-13 NOTE — Telephone Encounter (Signed)
  Oncology Nurse Navigator Documentation  Navigator Location: [facility] (01/13/16 0900)                 Barriers/Navigation Needs: Coordination of Care (01/13/16 0900)   Interventions: Referrals;Coordination of Care (01/13/16 0900) Referrals: Other ([facility]) (01/13/16 0900)          Acuity: Level 2 (01/13/16 0900)   Acuity Level 2: Assistance expediting appointments;Ongoing guidance and education throughout treatment as needed (01/13/16 0900)         Referral called to [facility] for liver mass. With no confirmed diagnosis of cancer they required referral to be made with surgeon. Appt made with Dr Cristino Martes for 01/25/16 at 1330. We will notify of appt at visit with Dr Rogue Bussing 01/14/16.

## 2016-01-14 ENCOUNTER — Inpatient Hospital Stay: Payer: 59 | Attending: Internal Medicine | Admitting: Internal Medicine

## 2016-01-14 VITALS — BP 133/79 | HR 88 | Temp 98.2°F | Resp 18 | Wt 218.2 lb

## 2016-01-14 DIAGNOSIS — I8500 Esophageal varices without bleeding: Secondary | ICD-10-CM | POA: Diagnosis not present

## 2016-01-14 DIAGNOSIS — K8020 Calculus of gallbladder without cholecystitis without obstruction: Secondary | ICD-10-CM | POA: Insufficient documentation

## 2016-01-14 DIAGNOSIS — R599 Enlarged lymph nodes, unspecified: Secondary | ICD-10-CM | POA: Diagnosis not present

## 2016-01-14 DIAGNOSIS — Z79899 Other long term (current) drug therapy: Secondary | ICD-10-CM | POA: Diagnosis not present

## 2016-01-14 DIAGNOSIS — E119 Type 2 diabetes mellitus without complications: Secondary | ICD-10-CM | POA: Diagnosis not present

## 2016-01-14 DIAGNOSIS — J90 Pleural effusion, not elsewhere classified: Secondary | ICD-10-CM | POA: Diagnosis not present

## 2016-01-14 DIAGNOSIS — K769 Liver disease, unspecified: Secondary | ICD-10-CM | POA: Diagnosis not present

## 2016-01-14 DIAGNOSIS — R162 Hepatomegaly with splenomegaly, not elsewhere classified: Secondary | ICD-10-CM | POA: Insufficient documentation

## 2016-01-14 DIAGNOSIS — R188 Other ascites: Secondary | ICD-10-CM | POA: Insufficient documentation

## 2016-01-14 DIAGNOSIS — I10 Essential (primary) hypertension: Secondary | ICD-10-CM | POA: Insufficient documentation

## 2016-01-14 DIAGNOSIS — C801 Malignant (primary) neoplasm, unspecified: Secondary | ICD-10-CM | POA: Insufficient documentation

## 2016-01-14 DIAGNOSIS — Z803 Family history of malignant neoplasm of breast: Secondary | ICD-10-CM | POA: Diagnosis not present

## 2016-01-14 DIAGNOSIS — K7460 Unspecified cirrhosis of liver: Secondary | ICD-10-CM | POA: Diagnosis not present

## 2016-01-14 DIAGNOSIS — R971 Elevated cancer antigen 125 [CA 125]: Secondary | ICD-10-CM | POA: Insufficient documentation

## 2016-01-14 DIAGNOSIS — K766 Portal hypertension: Secondary | ICD-10-CM | POA: Diagnosis not present

## 2016-01-14 DIAGNOSIS — N63 Unspecified lump in breast: Secondary | ICD-10-CM | POA: Diagnosis not present

## 2016-01-14 DIAGNOSIS — R601 Generalized edema: Secondary | ICD-10-CM | POA: Insufficient documentation

## 2016-01-14 MED ORDER — SPIRONOLACTONE 50 MG PO TABS: 50.0000 mg | ORAL_TABLET | Freq: Every day | ORAL | 3 refills | Status: DC

## 2016-01-14 MED ORDER — POTASSIUM CHLORIDE CRYS ER 20 MEQ PO TBCR: 20.0000 meq | EXTENDED_RELEASE_TABLET | Freq: Two times a day (BID) | ORAL | 3 refills | Status: DC

## 2016-01-14 MED ORDER — FUROSEMIDE 20 MG PO TABS: ORAL_TABLET | ORAL | 3 refills | Status: DC

## 2016-01-14 NOTE — Assessment & Plan Note (Addendum)
10 x 10 cm enhancing Mass in the liver on the CT scan MRI.- highly suspicious for malignancy. PET- similar mass limited to the liver. CA-19-9/AFP normal. CA-125 elevated. Paracentesis/cytology negative for malignancy.  #  Discussed with interventional radiology- high risk for bleeding given ascites. Recommend evaluation at a higher center. Patient has been referred to [facility]- further evaluation. Appointment on August 22.  # Ascites - Para if needed in 7-10 days; m tentative if needed.  # Ascites/cirrhosis- decompensated. As above; continue Lasix/potassium/spirinolactone.  # Patient will tentatively follow-up with me in approximately 3 weeks with labs/ labs in 1 week   I reviewed the images myself and with the patient and family in detail. Patient recent follow-up with [place] for medical oncology standpoint.

## 2016-01-14 NOTE — Progress Notes (Signed)
Patient here for results. 

## 2016-01-14 NOTE — Progress Notes (Signed)
[place] NOTE  Patient Care Team: Crecencio Mc, MD as PCP - General (Internal Medicine) Robert Bellow, MD as Consulting Physician (General Surgery) Pollyann Glen, RN as [place] Management Kem Parkinson, [facility] as [place] Management (Pharmacist)  CHIEF COMPLAINTS/PURPOSE OF CONSULTATION:   Oncology History   # July-AUG [phone] x 10 cm lesion in the liver; MRI- enhancing/ left portal vein ;PET- uptake limited to liver. Ca 125- 1500; APF- N; Ca- 19-9-N.   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  female with Newly diagnosed cirrhosis of the liver/ascites- is here to review the results of her workup including PET scan.   Patient also had paracentesis done. Patient noted to have improvement of the abdominal distention. Appetite improving. No difficulty breathing. Continues to have leg swelling.  No nausea no vomiting. No pain. No shortness of breath.   ROS: A complete 10 point review of system is done which is negative except mentioned above in history of present illness  MEDICAL HISTORY:  Past Medical History:  Diagnosis Date  . Ascites 01/09/2006   Ascites - 3L drawn off on 01/04/2016  . Breast lump in female 2014  . Diabetes mellitus without complication ([place])   . Elevated blood pressure reading   . Hypertension   . Liver mass 01/10/2016   Per patient; bloating.  Mass found via [place], CT, and MRI.     SURGICAL HISTORY: Past Surgical History:  Procedure Laterality Date  . BREAST BIOPSY  1996  . BREAST BIOPSY  2014   Calcifications - [place]  . BREAST LUMPECTOMY    . SHOULDER SURGERY  2011   right , [place]     SOCIAL HISTORY: no smoking/ alcohol.Gillermina Hu.. Self..  Social History   Social  History  . Marital status: Divorced    Spouse name: N/A  . Number of children: N/A  . Years of education: N/A   Occupational History  . Not on file.   Social History Main Topics  . Smoking status: Never Smoker  . Smokeless tobacco: Never Used  . Alcohol use No  . Drug use: No  . Sexual activity: Not on file   Other Topics Concern  . Not on file   Social History Narrative  . No narrative on file    FAMILY HISTORY:  Family History  Problem Relation Age of Onset  . Cancer Mother 36    Breast Cancer  . Breast cancer Mother   . Asthma Father   . Diabetes Father     ALLERGIES:  is allergic to adhesive [tape].  MEDICATIONS:  Current Outpatient Prescriptions  Medication Sig Dispense Refill  . ALPRAZolam (XANAX) 1 MG tablet Take 1 tablet 60 minutes prior to scan and may repeat if needed 3 tablet 0  . cholecalciferol (VITAMIN D) 1000 UNITS tablet Take 1,000 Units by mouth daily. Reported on 12/01/2015    . fluticasone (FLONASE) 50 MCG/ACT nasal spray Place 2 sprays into both nostrils daily. 16 g 6  . furosemide (LASIX) 20 MG tablet Take 2 pills every day. 60 tablet 3  . glucose blood (BAYER CONTOUR NEXT TEST) test strip Check sugar daily Dx E11.9 100 each 3  .  Lancets (ONETOUCH ULTRASOFT) lancets Use one time daily to scheck blood sugars   250.00 100 each 3  . lisinopril (PRINIVIL,ZESTRIL) 10 MG tablet TAKE 1 TABLET (10 MG TOTAL) BY MOUTH DAILY. 90 tablet 1  . Vitamin D, Ergocalciferol, (DRISDOL) 50000 UNITS CAPS Take 50,000 Units by mouth every 7 (seven) days. Reported on 12/01/2015    . zoster vaccine live, PF, (ZOSTAVAX) 16109 UNT/0.65ML injection Inject 19,400 Units into the skin once. 1 each 0  . potassium chloride SA (K-DUR,KLOR-CON) 20 MEQ tablet Take 1 tablet (20 mEq total) by mouth 2 (two) times daily. 60 tablet 3  . spironolactone (ALDACTONE) 50 MG tablet Take 1 tablet (50 mg total) by mouth daily. 30 tablet 3   No current facility-administered medications for this  visit.       Marland Kitchen  PHYSICAL EXAMINATION: ECOG PERFORMANCE STATUS: 1 - Symptomatic but completely ambulatory  Vitals:   01/14/16 1349  BP: 133/79  Pulse: 88  Resp: 18  Temp: 98.2 F (36.8 C)   Filed Weights   01/14/16 1349  Weight: 218 lb 3 oz (99 kg)    GENERAL: Well-nourished well-developed; Alert, no distress and comfortable.   Accompanied by daughter and son-in-law. She is walking herself. EYES: no pallor or icterus OROPHARYNX: no thrush or ulceration; good dentition  NECK: supple, no masses felt LYMPH:  no palpable lymphadenopathy in the cervical, axillary or inguinal regions LUNGS: clear to auscultation and  No wheeze or crackles HEART/CVS: regular rate & rhythm and no murmurs; No lower extremity edema ABDOMEN: abdomen soft, non-tender and normal bowel sounds; positive for distention. Positive for hepatomegaly and spelled megaly. Positive for ascites. Musculoskeletal:no cyanosis of digits and no clubbing  PSYCH: alert & oriented x 3 with fluent speech NEURO: no focal motor/sensory deficits SKIN:  no rashes or significant lesions  LABORATORY DATA:  I have reviewed the data as listed Lab Results  Component Value Date   WBC 3.7 12/31/2015   HGB 12.4 12/31/2015   HCT 37.2 12/31/2015   MCV 82.9 12/31/2015   PLT 162 12/31/2015    Recent Labs  10/22/15 0755 12/15/15 1159 12/20/15 1122 12/31/15 1203  NA 141  --   --  137  K 5.0  --   --  3.7  CL 106  --   --  102  CO2 27  --   --  28  GLUCOSE 109*  --   --  114*  BUN 10  --   --  10  CREATININE 0.61  --  0.50 0.54  CALCIUM 8.9  --   --  8.6*  GFRNONAA  --   --   --  >60  GFRAA  --   --   --  >60  PROT 5.7* 6.3  --  6.8  ALBUMIN 3.4* 3.3*  --  3.4*  AST 25 26  --  32  ALT 11 10  --  13*  ALKPHOS 69 81  --  90  BILITOT 1.2 1.5*  --  1.8*  BILIDIR  --  0.5*  --   --     RADIOGRAPHIC STUDIES: I have personally reviewed the radiological images as listed and agreed with the findings in the report. Mr  Abdomen W Wo Contrast  Result Date: 12/23/2015 CLINICAL DATA:  Liver mass. Bloating. Diabetes. Hypertension. History of cirrhosis. EXAM: MRI ABDOMEN WITHOUT AND WITH CONTRAST TECHNIQUE: Multiplanar multisequence MR imaging of the abdomen was performed both before and after the administration of intravenous contrast. CONTRAST:  10.0 ml Eovist, a mixed extracellular and hepatocyte specific contrast agent. COMPARISON:  12/20/2015 CT.  No prior MR. FINDINGS: Mild to moderate motion degradation. Exam also degraded by patient body habitus. Lower chest:  Left-sided pleural effusion.  Normal heart size. Hepatobiliary: Moderate cirrhosis. Hepatic dome mass is again identified. Measures 9.6 x 8.6 cm transverse on image 43/series 3. 5.3 cm craniocaudal on image 16/series 2. Mildly T2 hyperintense and T1 hypo intense. Demonstrates arterial phase homogeneous post-contrast enhancement image 19/series 6. Relatively mildly hyper enhancing on more delayed post-contrast images. Although this is centered in segment 4A, extends into the right lobe of the liver, especially posterior to the IVC on image 9/ series 12. A separate 11 mm right hepatic lobe lesion image 48/series 9 and image 15/series 2 is indeterminate secondary to motion but favored to represent a hemangioma, given T2 hyperintensity. Small gallstones without acute cholecystitis or biliary duct dilatation. Pancreas: Grossly normal, without duct dilatation. Spleen: Splenomegaly, 18.5 cm craniocaudal. Adrenals/Urinary Tract: Normal adrenal glands. Normal kidneys, without hydronephrosis. Stomach/Bowel: Normal stomach and abdominal bowel loops. Vascular/Lymphatic: Normal caliber of the aorta and branch vessels. No splenic artery aneurysm. The left portal vein is not well opacified, including on image 27 and 24/ series 5. Hepatic veins are not well evaluated. Portal venous hypertension, including periesophageal varices. Abdominal adenopathy, including a porta hepatis 2.1 cm  node on image 43/series 7. Right cardiophrenic angle adenopathy including at 12 mm on image 17/ series 5. Other: Moderate abdominal ascites. Musculoskeletal: No acute osseous abnormality. IMPRESSION: 1. Motion and patient body habitus degradation. 2. Infiltrative hepatic dome mass (centered in segment 4A with extension into right lobe ). Given concurrent cirrhosis, most consistent with hepatocellular carcinoma. Left portal vein involvement is favored. Lack of visualization secondary to compression felt less likely. 3. Portal venous hypertension and ascites. 4. Abdominal adenopathy which is indeterminate in the setting of cirrhosis. Favored to be reactive. Nodal metastasis felt less likely. 5. Persistent left pleural effusion. 6. Small right hepatic lobe lesion is favored to represent a hemangioma but suboptimally evaluated secondary to small size and study limitations. Recommend attention on follow-up. 7. Cholelithiasis. Electronically Signed   By: Abigail Miyamoto M.D.   On: 12/23/2015 10:54   Ct Abdomen Pelvis W Contrast  Result Date: 12/20/2015 CLINICAL DATA:  Hepatic cirrhosis, ascites, splenomegaly, elevated CA 125 EXAM: CT ABDOMEN AND PELVIS WITH CONTRAST TECHNIQUE: Multidetector CT imaging of the abdomen and pelvis was performed using the standard protocol following bolus administration of intravenous contrast. CONTRAST:  151mL ISOVUE-300 IOPAMIDOL (ISOVUE-300) INJECTION 61% COMPARISON:  12/10/2015 ultrasound FINDINGS: Lower chest: Right lower lobe is clear. Moderate dependent left effusion with lingula and left lower lobe compressive atelectasis. Normal heart size. No pericardial effusion. Anterior pericardial and paraesophageal mild adenopathy present. Pericardial lymph node has a short axis measurement of 14 mm. Paraesophageal lymph node has a short axis of 12 mm. Hepatobiliary: Diffusely heterogeneous liver with a nodular surface compatible with underlying hepatic cirrhosis. Heterogeneous irregular and  diffusely enhancing infiltrative mass centered in segment 4A of the liver, roughly measuring 10.3 x 9.2 cm, image 15. This is concerning for an infiltrative hepatocellular carcinoma. Recommend further evaluation with liver MRI without and with contrast. No associated biliary dilatation. Portal vein remains patent. Gallbladder is underdistended but small calcified gallstones noted. Common bile duct is nondilated. Gastrohepatic ligament and periportal adenopathy noted. Largest periportal lymph node measures 4.0 x 2.1 cm, image 28. Pancreas: No mass, inflammatory changes, or other significant abnormality. Spleen: Spleen is enlarged measuring  19.5 cm in craniocaudal length. Splenic vein is patent but diffusely dilated. Tortuous enhancing vessels in the splenic hilum and posterior to the stomach compatible with short gastric varices. Small gastro renal shunt also noted. Large volume diffuse abdominal ascites and slight mesenteric vascular congestion present. Diffuse body anasarca noted. Adrenals/Urinary Tract: No masses identified. No evidence of hydronephrosis. Stomach/Bowel: No evidence of obstruction, inflammatory process, or abnormal fluid collections. Vascular/Lymphatic: Lower chest and upper abdominal adenopathy as described above concerning for metastatic adenopathy related to the large infiltrative liver mass. Minor aortoiliac atherosclerosis. Negative for aneurysm. No retroperitoneal abnormality or hemorrhage. Reproductive: Uterus is small and atrophic. Degenerated partially calcified exophytic right fundal fibroid noted measures 3.1 cm, image 73. Ovaries are symmetric and normal in size. No definite adnexal mass. Other: Large volume of ascites extends into the pelvis. No inguinal abnormality or hernia. Intact abdominal wall. Musculoskeletal: Degenerative changes of the lumbar spine. No acute compression fracture. Unilateral left L5 pars defect noted. Lower lumbar facet arthropathy also evident. No acute osseous  finding. IMPRESSION: Hepatic cirrhosis with an infiltrative heterogeneous enhancing mass centered in segment 4A suspicious for an infiltrative hepatocellular carcinoma. No associated biliary obstruction or portal vein thrombus. Recommend further evaluation with abdominal MRI without and with contrast. Lower chest and upper abdominal diffuse adenopathy concerning for metastatic adenopathy. Stigmata of portal hypertension with splenomegaly, short gastric varices, small gastro renal shunt, and large volume of abdominal and pelvic ascites. Moderate left effusion with lingula and left lower lobe compressive atelectasis. Incidental cholelithiasis Degenerated small uterine fibroid. No significant ovarian or adnexal abnormality. These results will be called to the ordering clinician or representative by the Radiologist Assistant, and communication documented in the PACS or zVision Dashboard. Electronically Signed   By: Jerilynn Mages.  Shick M.D.   On: 12/20/2015 14:12   Nm Pet Image Initial (pi) Skull Base To Thigh  Result Date: 01/10/2016 CLINICAL DATA:  Initial treatment strategy for liver mass. 70 year old female with decompensated cirrhosis and large segment 4A liver mass on recent CT and MRI abdomen studies, status post paracentesis 6 days prior. EXAM: NUCLEAR MEDICINE PET SKULL BASE TO THIGH TECHNIQUE: 11.8 mCi F-18 FDG was injected intravenously. Full-ring PET imaging was performed from the skull base to thigh after the radiotracer. CT data was obtained and used for attenuation correction and anatomic localization. FASTING BLOOD GLUCOSE:  Value: 95 mg/dl COMPARISON:  12/23/2015 MRI abdomen and 12/20/2015 CT abdomen/ pelvis. FINDINGS: NECK No hypermetabolic lymph nodes in the neck. CHEST Mildly hypermetabolic subcentimeter left axillary node with max SUV 3.8 (series 3/image 52), favor injection related activity (patient was injected with radiotracer in the left antecubital fossa). No hypermetabolic mediastinal or hilar nodes.  Mildly enlarged right pericardiophrenic lymph nodes are non hypermetabolic, largest 1.2 cm (series 3/ image 105), unchanged since 12/20/2015, consistent with reactive etiology. Small to moderate layering left pleural effusion appears slightly decreased from 12/20/2015. No right pleural effusion. Atherosclerotic nonaneurysmal thoracic aorta. Moderate compressive atelectasis in the dependent left lower lobe. No acute consolidative airspace disease or significant pulmonary nodules. ABDOMEN/PELVIS There is relative hypertrophy of the left liver lobe with diffuse liver surface irregularity, consistent with cirrhosis. Moderate volume ascites appears mildly decreased from 12/20/2015. There is a large poorly marginated hypermetabolic liver mass centered in segment 4A of the left liver lobe with max SUV 8.1, poorly delineated on the noncontrast CT images, measuring approximately 10.0 x 9.5 cm (series 3/image 113), not appreciably changed in size since 12/20/2015. No additional hypermetabolic liver masses. There a few mildly hypermetabolic  aortocaval lymph nodes, for example a 0.8 cm aortocaval node with max SUV 4.2 (series 3/image 153). No definite hypermetabolism is seen within mildly enlarged porta hepatis and gastrohepatic ligament nodes. No abnormal hypermetabolic activity within the pancreas, adrenal glands, or spleen. Cholelithiasis . Moderate splenomegaly. Calcified exophytic 3.3 cm non hypermetabolic anterior right uterine body fibroid, partially calcified. SKELETON No focal hypermetabolic activity to suggest skeletal metastasis. IMPRESSION: 1. 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 2. No definite evidence of hypermetabolic metastatic disease. Mildly hypermetabolic aortocaval retroperitoneal lymph nodes are nonspecific and may be reactive, with nodal metastases not entirely  excluded. Mildly hypermetabolic nonenlarged left axillary lymph node is favored to be benign due to injection related activity. 3. Sequela of portal hypertension including moderate volume ascites and moderate splenomegaly. 4. Small to moderate layering left pleural effusion, slightly decreased. 5. Aortic atherosclerosis. Additional findings include cholelithiasis and uterine fibroid. Electronically Signed   By: Ilona Sorrel M.D.   On: 01/10/2016 12:18   US Paracentesis  Result Date: 01/04/2016 INDICATION: 70 year old female with hepatic mass and ascites. She presents for her first ultrasound-guided paracentesis. EXAM: ULTRASOUND GUIDED  PARACENTESIS MEDICATIONS: None. COMPLICATIONS: None immediate. PROCEDURE: Informed written consent was obtained from the patient after a discussion of the risks, benefits and alternatives to treatment. A timeout was performed prior to the initiation of the procedure. Initial ultrasound scanning demonstrates a large amount of ascites within the right lower abdominal quadrant. The right lower abdomen was prepped and draped in the usual sterile fashion. 1% lidocaine with epinephrine was used for local anesthesia. Following this, a 6 Fr Safe-T-Centesis catheter was introduced. An ultrasound image was saved for documentation purposes. The paracentesis was performed. The catheter was removed and a dressing was applied. The patient tolerated the procedure well without immediate post procedural complication. FINDINGS: A total of approximately 3 L of amber colored ascitic fluid was removed. Samples were sent to the laboratory as requested by the clinical team. IMPRESSION: Successful ultrasound-guided paracentesis yielding 3 liters of peritoneal fluid. Electronically Signed   By: Jacqulynn Cadet M.D.   On: 01/04/2016 14:24   Mm Screening Breast Tomo Bilateral  Result Date: 01/07/2016 CLINICAL DATA:  Screening. EXAM: 2D DIGITAL SCREENING BILATERAL MAMMOGRAM WITH CAD AND ADJUNCT TOMO  COMPARISON:  Previous exam(s). ACR Breast Density Category b: There are scattered areas of fibroglandular density. FINDINGS: There are no findings suspicious for malignancy. Images were processed with CAD. IMPRESSION: No mammographic evidence of malignancy. A result letter of this screening mammogram will be mailed directly to the patient. RECOMMENDATION: Screening mammogram in one year. (Code:SM-B-01Y) BI-RADS CATEGORY  1: Negative. Electronically Signed   By: Everlean Alstrom M.D.   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 PET- similar mass limited to the liver. CA-19-9/AFP normal. CA-125 elevated. Paracentesis/cytology negative for malignancy.  #  Discussed with interventional radiology- high risk for bleeding given ascites. Recommend evaluation at a higher center. Patient has been referred to [facility]- further evaluation.   # Ascites - Para if needed in 7-10 days; m tentative if needed.  # Ascites/cirrhosis- decompensated. As above; continue Lasix/potassium/spirinolactone.  # Patient will tentatively follow-up with me in approximately 3 weeks with labs/ labs in 1 week   I reviewed the  images myself and with the patient and family in detail. Patient recent follow-up with [place] for medical oncology standpoint.     Cammie Sickle, MD 01/14/2016 6:44 PM

## 2016-01-17 ENCOUNTER — Other Ambulatory Visit: Payer: Self-pay | Admitting: Pharmacist

## 2016-01-17 ENCOUNTER — Encounter: Payer: Self-pay | Admitting: Pharmacist

## 2016-01-17 ENCOUNTER — Encounter: Payer: Self-pay | Admitting: Internal Medicine

## 2016-01-17 NOTE — Patient Outreach (Addendum)
[facility]) Care Management  [place]   01/17/2016  Tieler Lipnick [place] 1946/02/17 [id number]  Subjective: Patient presents today for  follow-up as part of the employer-sponsored Link to Wellness program.  Diabetes currently controlled with diet and exercise and patient is not currently on medications.  Patient also continues on lisinopril but is not currently taking aspirin or a statin. Most recent MD follow-up was 12/01/15 with Dr Derrel Nip.  Patient was seen last week and diagnosed with liver ascites/cirrhosis and started on Lasix/Potassium/Spironolactone. She has been referred to [facility] for further evaluation.  She does report weighing herself daily and increased satiety due to the fluid from her ascites. Patient does not wish to start metformin, statin or aspirin at this time for insulin resistance and cardiovascular protection. She wishes to prevent pill burden.   Patient denies hypoglycemic events. Reports proper management plan.   Patient reported dietary habits: Eats 3 meals/day. She reports adhering to a low carbohydrate and low sodium diet.  States she eats red meat/steak 3 times per week and grilled chicken.  She often eats vegetables including salads, carrots, cauliflower, green beans or other fresh vegetables.  She states she avoids processed foods.  Her food intake has decreased due to increased satiety from her ascites.   Patient reported exercise habits: Working/walking around her house for around 20 minutes/day for 3-4 days per week.  She states her walking is limited by her fluid but walking does help her to feel better.     Patient denies neuropathy. Patient denies visual changes. Patient reports self foot exams.  Reports no changes.   Objective:  Lab Results  Component Value Date   HGBA1C 6.1 10/22/2015   Vitals:   01/17/16 0934  BP: 135/65  Pulse: 94    Lipid Panel     Component Value Date/Time   CHOL 107 10/22/2015 0755   CHOL 156 01/12/2012  0853   TRIG 88.0 10/22/2015 0755   TRIG 112 01/12/2012 0853   HDL 23.70 (L) 10/22/2015 0755   HDL 42 01/12/2012 0853   CHOLHDL 4 10/22/2015 0755   VLDL 17.6 10/22/2015 0755   VLDL 22 01/12/2012 0853   LDLCALC 65 10/22/2015 0755   LDLCALC 92 01/12/2012 0853   LDLDIRECT 57.0 10/22/2015 0755    Home fasting CBG: range 105-117 mg/dL  2 hour post-prandial/random CBG: 109-146 mg/dL    Encounter Medications: Outpatient Encounter Prescriptions as of 01/17/2016  Medication Sig  . furosemide (LASIX) 20 MG tablet Take 2 pills every day.  Marland Kitchen glucose blood (BAYER CONTOUR NEXT TEST) test strip Check sugar daily Dx E11.9  . Lancets (ONETOUCH ULTRASOFT) lancets Use one time daily to scheck blood sugars   250.00  . lisinopril (PRINIVIL,ZESTRIL) 10 MG tablet TAKE 1 TABLET (10 MG TOTAL) BY MOUTH DAILY.  Marland Kitchen potassium chloride SA (K-DUR,KLOR-CON) 20 MEQ tablet Take 1 tablet (20 mEq total) by mouth 2 (two) times daily.  Marland Kitchen spironolactone (ALDACTONE) 50 MG tablet Take 1 tablet (50 mg total) by mouth daily.  . cholecalciferol (VITAMIN D) 1000 UNITS tablet Take 1,000 Units by mouth daily. Reported on 12/01/2015  . fluticasone (FLONASE) 50 MCG/ACT nasal spray Place 2 sprays into both nostrils daily. (Patient taking differently: Place 2 sprays into both nostrils daily as needed. )  . zoster vaccine live, PF, (ZOSTAVAX) 09811 UNT/0.65ML injection Inject 19,400 Units into the skin once. (Patient not taking: Reported on 01/17/2016)  . [DISCONTINUED] ALPRAZolam (XANAX) 1 MG tablet Take 1 tablet 60 minutes prior to  scan and may repeat if needed  . [DISCONTINUED] Vitamin D, Ergocalciferol, (DRISDOL) 50000 UNITS CAPS Take 50,000 Units by mouth every 7 (seven) days. Reported on 12/01/2015   No facility-administered encounter medications on file as of 01/17/2016.     Functional Status: In your present state of health, do you have any difficulty performing the following activities: 01/17/2016 03/22/2015  Hearing? N N   Vision? N N  Difficulty concentrating or making decisions? N N  Walking or climbing stairs? N N  Dressing or bathing? N N  Doing errands, shopping? N N  Some recent data might be hidden    Fall/Depression Screening: PHQ 2/9 Scores 01/17/2016 03/22/2015 03/22/2015 11/04/2014 09/09/2014 10/15/2013  PHQ - 2 Score 0 0 0 0 0 0     Assessment:  Diabetes: Most recent A1C was 6.1% which is at goal of less than 7% currently on no antidiabetic medications. Self monitored blood glucose remain at goal of fasting blood glucose 80-130 mg/dL. Patient not currently on a statin or aspirin.  10 year ASCVD risk >7.5% but patients LDL currently at 65.  Patient does not wish to start metformin, statin or aspirin at this time.    Plan/Goals for Next Visit: -Counseled on low carbohydrate diet, low sodium diet, and strategies to prevent edema with low sodium foods and increasing leg movement to assist with lower extremity edema.  -Continue to control diabetes with diet and exercise -Continue low carbohydrate and low sodium diet -Continue walking at home and Increase walking as tolerated to help with lower extremity edema -Shingles vaccine once her ascites issues are resolved   Bennye Alm, PharmD [facility] PGY2 Pharmacy Resident [phone]  [facility] CM Care Plan Problem One   Flowsheet Row Most Recent Value  Care Plan Problem One  Diabetes Control by Diet and Exercise  Role Documenting the Problem One  Clinical Pharmacist  Care Plan for Problem One  Active  [facility] Long Term Goal (31-90 days)  Continue to maintain A1C less than 7 by continuing lifestyle changes as evidenced by patient report over the next 90 days  [facility] Long Term Goal Start Date  01/17/16  Interventions for Problem One Long Term Goal  Counseled on low carbohydrate and low sodium diet.  Discussed increasing walking to help with lower extremity edema as tolerated.

## 2016-01-20 ENCOUNTER — Inpatient Hospital Stay: Payer: 59

## 2016-01-20 DIAGNOSIS — K7460 Unspecified cirrhosis of liver: Secondary | ICD-10-CM | POA: Diagnosis not present

## 2016-01-20 DIAGNOSIS — C801 Malignant (primary) neoplasm, unspecified: Secondary | ICD-10-CM

## 2016-01-20 DIAGNOSIS — N63 Unspecified lump in breast: Secondary | ICD-10-CM | POA: Diagnosis not present

## 2016-01-20 DIAGNOSIS — I10 Essential (primary) hypertension: Secondary | ICD-10-CM | POA: Diagnosis not present

## 2016-01-20 DIAGNOSIS — R188 Other ascites: Secondary | ICD-10-CM | POA: Diagnosis not present

## 2016-01-20 DIAGNOSIS — E119 Type 2 diabetes mellitus without complications: Secondary | ICD-10-CM | POA: Diagnosis not present

## 2016-01-20 DIAGNOSIS — J90 Pleural effusion, not elsewhere classified: Secondary | ICD-10-CM | POA: Diagnosis not present

## 2016-01-20 DIAGNOSIS — K769 Liver disease, unspecified: Secondary | ICD-10-CM | POA: Diagnosis not present

## 2016-01-20 DIAGNOSIS — I8500 Esophageal varices without bleeding: Secondary | ICD-10-CM | POA: Diagnosis not present

## 2016-01-20 LAB — BASIC METABOLIC PANEL
Anion gap: 7 (ref 5–15)
BUN: 12 mg/dL (ref 6–20)
CALCIUM: 8.4 mg/dL — AB (ref 8.9–10.3)
CHLORIDE: 101 mmol/L (ref 101–111)
CO2: 27 mmol/L (ref 22–32)
CREATININE: 0.6 mg/dL (ref 0.44–1.00)
GFR calc Af Amer: >60 mL/min (ref >60)
GFR calc non Af Amer: >60 mL/min (ref >60)
GLUCOSE: 120 mg/dL — AB (ref 65–99)
Potassium: 4.4 mmol/L (ref 3.5–5.1)
Sodium: 135 mmol/L (ref 135–145)

## 2016-01-20 LAB — CBC WITH DIFFERENTIAL/PLATELET
BASOS PCT: 1 %
Basophils Absolute: 0 10*3/uL (ref 0–0.1)
EOS ABS: 0 10*3/uL (ref 0–0.7)
Eosinophils Relative: 1 %
HCT: 35.7 % (ref 35.0–47.0)
HEMOGLOBIN: 12 g/dL (ref 12.0–16.0)
LYMPHS ABS: 1 10*3/uL (ref 1.0–3.6)
Lymphocytes Relative: 25 %
MCH: 27.8 pg (ref 26.0–34.0)
MCHC: 33.5 g/dL (ref 32.0–36.0)
MCV: 83 fL (ref 80.0–100.0)
MONO ABS: 0.5 10*3/uL (ref 0.2–0.9)
MONOS PCT: 12 %
NEUTROS PCT: 61 %
Neutro Abs: 2.3 10*3/uL (ref 1.4–6.5)
Platelets: 160 10*3/uL (ref 150–440)
RBC: 4.31 MIL/uL (ref 3.80–5.20)
RDW: 16.6 % — AB (ref 11.5–14.5)
WBC: 3.8 10*3/uL (ref 3.6–11.0)

## 2016-01-25 DIAGNOSIS — R161 Splenomegaly, not elsewhere classified: Secondary | ICD-10-CM | POA: Diagnosis not present

## 2016-01-25 DIAGNOSIS — K766 Portal hypertension: Secondary | ICD-10-CM | POA: Diagnosis not present

## 2016-01-25 DIAGNOSIS — R160 Hepatomegaly, not elsewhere classified: Secondary | ICD-10-CM | POA: Diagnosis not present

## 2016-01-25 DIAGNOSIS — C220 Liver cell carcinoma: Secondary | ICD-10-CM | POA: Diagnosis not present

## 2016-01-25 DIAGNOSIS — J90 Pleural effusion, not elsewhere classified: Secondary | ICD-10-CM | POA: Diagnosis not present

## 2016-01-25 DIAGNOSIS — K7460 Unspecified cirrhosis of liver: Secondary | ICD-10-CM | POA: Diagnosis not present

## 2016-02-04 ENCOUNTER — Inpatient Hospital Stay: Payer: 59 | Attending: Internal Medicine

## 2016-02-04 ENCOUNTER — Ambulatory Visit
Admission: RE | Admit: 2016-02-04 | Discharge: 2016-02-04 | Disposition: A | Discharge status: Home / Self Care | Payer: 59 | Source: Ambulatory Visit | Attending: Internal Medicine | Admitting: Internal Medicine

## 2016-02-04 ENCOUNTER — Inpatient Hospital Stay (HOSPITAL_BASED_OUTPATIENT_CLINIC_OR_DEPARTMENT_OTHER): Payer: 59 | Admitting: Internal Medicine

## 2016-02-04 DIAGNOSIS — R971 Elevated cancer antigen 125 [CA 125]: Secondary | ICD-10-CM | POA: Diagnosis not present

## 2016-02-04 DIAGNOSIS — E119 Type 2 diabetes mellitus without complications: Secondary | ICD-10-CM | POA: Insufficient documentation

## 2016-02-04 DIAGNOSIS — Z79899 Other long term (current) drug therapy: Secondary | ICD-10-CM | POA: Insufficient documentation

## 2016-02-04 DIAGNOSIS — R140 Abdominal distension (gaseous): Secondary | ICD-10-CM | POA: Insufficient documentation

## 2016-02-04 DIAGNOSIS — Z803 Family history of malignant neoplasm of breast: Secondary | ICD-10-CM | POA: Diagnosis not present

## 2016-02-04 DIAGNOSIS — Z23 Encounter for immunization: Secondary | ICD-10-CM | POA: Diagnosis not present

## 2016-02-04 DIAGNOSIS — J90 Pleural effusion, not elsewhere classified: Secondary | ICD-10-CM

## 2016-02-04 DIAGNOSIS — R188 Other ascites: Secondary | ICD-10-CM | POA: Insufficient documentation

## 2016-02-04 DIAGNOSIS — K7460 Unspecified cirrhosis of liver: Secondary | ICD-10-CM | POA: Diagnosis not present

## 2016-02-04 DIAGNOSIS — C801 Malignant (primary) neoplasm, unspecified: Secondary | ICD-10-CM | POA: Insufficient documentation

## 2016-02-04 DIAGNOSIS — I10 Essential (primary) hypertension: Secondary | ICD-10-CM | POA: Diagnosis not present

## 2016-02-04 DIAGNOSIS — K769 Liver disease, unspecified: Secondary | ICD-10-CM | POA: Insufficient documentation

## 2016-02-04 DIAGNOSIS — R162 Hepatomegaly with splenomegaly, not elsewhere classified: Secondary | ICD-10-CM

## 2016-02-04 DIAGNOSIS — Z923 Personal history of irradiation: Secondary | ICD-10-CM

## 2016-02-04 DIAGNOSIS — I700 Atherosclerosis of aorta: Secondary | ICD-10-CM | POA: Insufficient documentation

## 2016-02-04 DIAGNOSIS — K766 Portal hypertension: Secondary | ICD-10-CM | POA: Insufficient documentation

## 2016-02-04 DIAGNOSIS — K8020 Calculus of gallbladder without cholecystitis without obstruction: Secondary | ICD-10-CM | POA: Insufficient documentation

## 2016-02-04 LAB — CBC WITH DIFFERENTIAL/PLATELET
Basophils Absolute: 0 10*3/uL (ref 0–0.1)
Basophils Relative: 1 %
EOS ABS: 0.1 10*3/uL (ref 0–0.7)
Eosinophils Relative: 1 %
HCT: 35.5 % (ref 35.0–47.0)
HEMOGLOBIN: 12 g/dL (ref 12.0–16.0)
LYMPHS ABS: 1 10*3/uL (ref 1.0–3.6)
LYMPHS PCT: 26 %
MCH: 28.1 pg (ref 26.0–34.0)
MCHC: 33.8 g/dL (ref 32.0–36.0)
MCV: 83.1 fL (ref 80.0–100.0)
Monocytes Absolute: 0.4 10*3/uL (ref 0.2–0.9)
Monocytes Relative: 11 %
NEUTROS PCT: 61 %
Neutro Abs: 2.2 10*3/uL (ref 1.4–6.5)
Platelets: 165 10*3/uL (ref 150–440)
RBC: 4.27 MIL/uL (ref 3.80–5.20)
RDW: 16.3 % — ABNORMAL HIGH (ref 11.5–14.5)
WBC: 3.7 10*3/uL (ref 3.6–11.0)

## 2016-02-04 LAB — COMPREHENSIVE METABOLIC PANEL
ALK PHOS: 82 U/L (ref 38–126)
ALT: 13 U/L — AB (ref 14–54)
AST: 29 U/L (ref 15–41)
Albumin: 3.1 g/dL — ABNORMAL LOW (ref 3.5–5.0)
Anion gap: 5 (ref 5–15)
BUN: 12 mg/dL (ref 6–20)
CALCIUM: 8.8 mg/dL — AB (ref 8.9–10.3)
CO2: 28 mmol/L (ref 22–32)
CREATININE: 0.62 mg/dL (ref 0.44–1.00)
Chloride: 104 mmol/L (ref 101–111)
GFR calc non Af Amer: >60 mL/min (ref >60)
Glucose, Bld: 137 mg/dL — ABNORMAL HIGH (ref 65–99)
Potassium: 5.1 mmol/L (ref 3.5–5.1)
SODIUM: 137 mmol/L (ref 135–145)
Total Bilirubin: 1.7 mg/dL — ABNORMAL HIGH (ref 0.3–1.2)
Total Protein: 6.2 g/dL — ABNORMAL LOW (ref 6.5–8.1)

## 2016-02-04 NOTE — Procedures (Signed)
Successful US guided paracentesis yielding 3.5 L of serous ascitic fluid. Sample sent to laboratory as requested.  EBL: None  No immediate post procedural complications.   Ronny Bacon, MD Pager #: [phone]

## 2016-02-04 NOTE — Assessment & Plan Note (Addendum)
10 x 10 cm enhancing Mass in the liver on the CT scan MRI.- highly suspicious for malignancy. PET- similar mass limited to the liver. CA-19-9/AFP normal. CA-125 elevated. Paracentesis/cytology negative for malignancy.  Awaiting repeat eval- with MRI/ [facility]. Also plans for Y90 at [facility] as per pt.   # Ascites - repeat tap; cytology ordered; decompensated. As above; continue Lasix/potassium/spirinolactone.  # Patient will tentatively follow-up with me in approximately 3 weeks with labs.   # 25 minutes face-to-face with the patient discussing the above plan of care; more than 50% of time spent on prognosis/ natural history; counseling and coordination.

## 2016-02-04 NOTE — Discharge Instructions (Signed)
Paracentesis, Care After °Refer to this sheet in the next few weeks. These instructions provide you with information about caring for yourself after your procedure. Your health care provider may also give you more specific instructions. Your treatment has been planned according to current medical practices, but problems sometimes occur. Call your health care provider if you have any problems or questions after your procedure. °WHAT TO EXPECT AFTER THE PROCEDURE °After your procedure, it is common to have a small amount of clear fluid coming from the puncture site. °HOME CARE INSTRUCTIONS °· Return to your normal activities as told by your health care provider. Ask your health care provider what activities are safe for you. °· Take over-the-counter and prescription medicines only as told by your health care provider. °· Do not take baths, swim, or use a hot tub until your health care provider approves. °· Follow instructions from your health care provider about: °¨ How to take care of your puncture site. °¨ When and how you should change your bandage (dressing). °¨ When you should remove your dressing. °· Check your puncture area every day signs of infection. Watch for: °¨ Redness, swelling, or pain. °¨ Fluid, blood, or pus. °· Keep all follow-up visits as told by your health care provider. This is important. °SEEK MEDICAL CARE IF: °· You have redness, swelling, or pain at your puncture site. °· You start to have more clear fluid coming from your puncture site. °· You have blood or pus coming from your puncture site. °· You have chills. °· You have a fever. °SEEK IMMEDIATE MEDICAL CARE IF: °· You develop chest pain or shortness of breath. °· You develop increasing pain, discomfort, or swelling in your abdomen. °· You feel dizzy or light-headed or you pass out. °  °This information is not intended to replace advice given to you by your health care provider. Make sure you discuss any questions you have with your health  care provider. °  °Document Released: 10/06/2014 Document Reviewed: 10/06/2014 °Elsevier Interactive Patient Education ©2016 Elsevier Inc. ° °

## 2016-02-08 ENCOUNTER — Telehealth: Payer: Self-pay | Admitting: *Deleted

## 2016-02-08 DIAGNOSIS — R161 Splenomegaly, not elsewhere classified: Secondary | ICD-10-CM | POA: Diagnosis not present

## 2016-02-08 DIAGNOSIS — I10 Essential (primary) hypertension: Secondary | ICD-10-CM | POA: Diagnosis not present

## 2016-02-08 DIAGNOSIS — R160 Hepatomegaly, not elsewhere classified: Secondary | ICD-10-CM | POA: Diagnosis not present

## 2016-02-08 DIAGNOSIS — C227 Other specified carcinomas of liver: Secondary | ICD-10-CM | POA: Diagnosis not present

## 2016-02-08 DIAGNOSIS — K7460 Unspecified cirrhosis of liver: Secondary | ICD-10-CM | POA: Diagnosis not present

## 2016-02-08 DIAGNOSIS — C220 Liver cell carcinoma: Secondary | ICD-10-CM | POA: Diagnosis not present

## 2016-02-08 LAB — CYTOLOGY - NON PAP

## 2016-02-08 NOTE — Telephone Encounter (Signed)
-----   Message from Cephus Richer sent at 02/08/2016  8:47 AM EDT ----- Contact: [phone] Please call pt she has to go to [place] today and need to know some information about her test from Friday that she had done here.

## 2016-02-08 NOTE — Telephone Encounter (Signed)
Pt called. [facility] wanted to know if there was any blood in paracentesis sample, which was collected on 9/1.  I contacted patient. 3.5 Liters serous fluid was collected at time of last paracentesis.  I fwd this report to patient's mychart today, so she can have this information for her apt at [place].  Pt stated that she appreciated my call back.

## 2016-02-08 NOTE — Progress Notes (Signed)
[place] NOTE  Patient Care Team: Crecencio Mc, MD as PCP - General (Internal Medicine) Robert Bellow, MD as Consulting Physician (General Surgery) Pollyann Glen, RN as [place] Management Kem Parkinson, [facility] as [place] Management (Pharmacist)  CHIEF COMPLAINTS/PURPOSE OF CONSULTATION:   Oncology History   # July-AUG [phone] x 10 cm lesion in the liver; MRI- enhancing/ left portal vein ;PET- uptake limited to liver. Ca 125- 1500; APF- N; Ca- 19-9-N.   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  female with Newly diagnosed cirrhosis of the liver/ascites; And also liver mass highly suspicious for malignancy is here for follow-up. Patient had been evaluated at [facility]- with Dr. Aaron Edelman. As per the patient she is awaiting- repeat MRI/possibe Y90/Biopsy.  Patient denies any worse and shortness of breath. However as noted her abdomen is distended. She denies any significant weight gain. She continues to be on diuretics. No nausea no vomiting. No pain. No shortness of breath.   ROS: A complete 10 point review of system is done which is negative except mentioned above in history of present illness  MEDICAL HISTORY:  Past Medical History:  Diagnosis Date  . Ascites 01/09/2006   Ascites - 3L drawn off on 01/04/2016  . Breast lump in female 2014  . Diabetes mellitus without complication ([place])   . Elevated blood pressure reading   . Hypertension   . Liver mass 01/10/2016   Per patient; bloating.  Mass found via [place], CT, and MRI.     SURGICAL HISTORY: Past Surgical History:  Procedure Laterality Date  . BREAST BIOPSY  1996  . BREAST BIOPSY  2014   Calcifications - [place]  . BREAST LUMPECTOMY    . SHOULDER  SURGERY  2011   right , [place]     SOCIAL HISTORY: no smoking/ alcohol.Gillermina Hu.. Self..  Social History   Social History  . Marital status: Divorced    Spouse name: N/A  . Number of children: N/A  . Years of education: N/A   Occupational History  . Not on file.   Social History Main Topics  . Smoking status: Never Smoker  . Smokeless tobacco: Never Used  . Alcohol use No  . Drug use: No  . Sexual activity: Not on file   Other Topics Concern  . Not on file   Social History Narrative  . No narrative on file    FAMILY HISTORY:  Family History  Problem Relation Age of Onset  . Cancer Mother 65    Breast Cancer  . Breast cancer Mother   . Asthma Father   . Diabetes Father     ALLERGIES:  is allergic to adhesive [tape].  MEDICATIONS:  Current Outpatient Prescriptions  Medication Sig Dispense Refill  . cholecalciferol (VITAMIN D) 1000 UNITS tablet Take 1,000 Units by mouth daily. Reported on 12/01/2015    . fluticasone (FLONASE) 50 MCG/ACT nasal spray Place 2 sprays into both nostrils daily. (Patient taking differently: Place 2 sprays into both nostrils daily as needed. ) 16 g 6  . furosemide (LASIX) 20 MG tablet Take 2 pills every day. 60 tablet 3  . glucose blood (BAYER CONTOUR NEXT TEST)  test strip Check sugar daily Dx E11.9 100 each 3  . Lancets (ONETOUCH ULTRASOFT) lancets Use one time daily to scheck blood sugars   250.00 100 each 3  . lisinopril (PRINIVIL,ZESTRIL) 10 MG tablet TAKE 1 TABLET (10 MG TOTAL) BY MOUTH DAILY. 90 tablet 1  . potassium chloride SA (K-DUR,KLOR-CON) 20 MEQ tablet Take 1 tablet (20 mEq total) by mouth 2 (two) times daily. 60 tablet 3  . spironolactone (ALDACTONE) 50 MG tablet Take 1 tablet (50 mg total) by mouth daily. 30 tablet 3  . zoster vaccine live, PF, (ZOSTAVAX) 91478 UNT/0.65ML injection Inject 19,400 Units into the skin once. 1 each 0   No current facility-administered medications for this visit.       Marland Kitchen  PHYSICAL  EXAMINATION: ECOG PERFORMANCE STATUS: 1 - Symptomatic but completely ambulatory  Vitals:   02/04/16 1034  BP: 118/67  Pulse: 81  Resp: 18  Temp: (!) 96.1 F (35.6 C)   Filed Weights   02/04/16 1034  Weight: 210 lb (95.3 kg)    GENERAL: Well-nourished well-developed; Alert, no distress and comfortable.   Accompanied by daughter and son-in-law. She is walking herself. EYES: no pallor or icterus OROPHARYNX: no thrush or ulceration; good dentition  NECK: supple, no masses felt LYMPH:  no palpable lymphadenopathy in the cervical, axillary or inguinal regions LUNGS: clear to auscultation and  No wheeze or crackles HEART/CVS: regular rate & rhythm and no murmurs; No lower extremity edema ABDOMEN: abdomen soft, non-tender and normal bowel sounds; positive for distention. Positive for hepatomegaly and spelled megaly. Positive for ascites. Musculoskeletal:no cyanosis of digits and no clubbing  PSYCH: alert & oriented x 3 with fluent speech NEURO: no focal motor/sensory deficits SKIN:  no rashes or significant lesions  LABORATORY DATA:  I have reviewed the data as listed Lab Results  Component Value Date   WBC 3.7 02/04/2016   HGB 12.0 02/04/2016   HCT 35.5 02/04/2016   MCV 83.1 02/04/2016   PLT 165 02/04/2016    Recent Labs  12/15/15 1159  12/31/15 1203 01/20/16 1340 02/04/16 1000  NA  --   --  137 135 137  K  --   --  3.7 4.4 5.1  CL  --   --  102 101 104  CO2  --   --  28 27 28   GLUCOSE  --   --  114* 120* 137*  BUN  --   --  10 12 12   CREATININE  --   < > 0.54 0.60 0.62  CALCIUM  --   --  8.6* 8.4* 8.8*  GFRNONAA  --   --  >60 >60 >60  GFRAA  --   --  >60 >60 >60  PROT 6.3  --  6.8  --  6.2*  ALBUMIN 3.3*  --  3.4*  --  3.1*  AST 26  --  32  --  29  ALT 10  --  13*  --  13*  ALKPHOS 81  --  90  --  82  BILITOT 1.5*  --  1.8*  --  1.7*  BILIDIR 0.5*  --   --   --   --   < > = values in this interval not displayed.  RADIOGRAPHIC STUDIES: I have personally  reviewed the radiological images as listed and agreed with the findings in the report. Nm Pet Image Initial (pi) Skull Base To Thigh  Result Date: 01/10/2016 CLINICAL DATA:  Initial treatment strategy for liver  mass. 70 year old female with decompensated cirrhosis and large segment 4A liver mass on recent CT and MRI abdomen studies, status post paracentesis 6 days prior. EXAM: NUCLEAR MEDICINE PET SKULL BASE TO THIGH TECHNIQUE: 11.8 mCi F-18 FDG was injected intravenously. Full-ring PET imaging was performed from the skull base to thigh after the radiotracer. CT data was obtained and used for attenuation correction and anatomic localization. FASTING BLOOD GLUCOSE:  Value: 95 mg/dl COMPARISON:  12/23/2015 MRI abdomen and 12/20/2015 CT abdomen/ pelvis. FINDINGS: NECK No hypermetabolic lymph nodes in the neck. CHEST Mildly hypermetabolic subcentimeter left axillary node with max SUV 3.8 (series 3/image 52), favor injection related activity (patient was injected with radiotracer in the left antecubital fossa). No hypermetabolic mediastinal or hilar nodes. Mildly enlarged right pericardiophrenic lymph nodes are non hypermetabolic, largest 1.2 cm (series 3/ image 105), unchanged since 12/20/2015, consistent with reactive etiology. Small to moderate layering left pleural effusion appears slightly decreased from 12/20/2015. No right pleural effusion. Atherosclerotic nonaneurysmal thoracic aorta. Moderate compressive atelectasis in the dependent left lower lobe. No acute consolidative airspace disease or significant pulmonary nodules. ABDOMEN/PELVIS There is relative hypertrophy of the left liver lobe with diffuse liver surface irregularity, consistent with cirrhosis. Moderate volume ascites appears mildly decreased from 12/20/2015. There is a large poorly marginated hypermetabolic liver mass centered in segment 4A of the left liver lobe with max SUV 8.1, poorly delineated on the noncontrast CT images, measuring  approximately 10.0 x 9.5 cm (series 3/image 113), not appreciably changed in size since 12/20/2015. No additional hypermetabolic liver masses. There a few mildly hypermetabolic aortocaval lymph nodes, for example a 0.8 cm aortocaval node with max SUV 4.2 (series 3/image 153). No definite hypermetabolism is seen within mildly enlarged porta hepatis and gastrohepatic ligament nodes. No abnormal hypermetabolic activity within the pancreas, adrenal glands, or spleen. Cholelithiasis . Moderate splenomegaly. Calcified exophytic 3.3 cm non hypermetabolic anterior right uterine body fibroid, partially calcified. SKELETON No focal hypermetabolic activity to suggest skeletal metastasis. IMPRESSION: 1. 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 2. No definite evidence of hypermetabolic metastatic disease. Mildly hypermetabolic aortocaval retroperitoneal lymph nodes are nonspecific and may be reactive, with nodal metastases not entirely excluded. Mildly hypermetabolic nonenlarged left axillary lymph node is favored to be benign due to injection related activity. 3. Sequela of portal hypertension including moderate volume ascites and moderate splenomegaly. 4. Small to moderate layering left pleural effusion, slightly decreased. 5. Aortic atherosclerosis. Additional findings include cholelithiasis and uterine fibroid. Electronically Signed   By: Ilona Sorrel M.D.   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 Please perform ultrasound-guided paracentesis for diagnostic and therapeutic purposes. Please limit paracentesis to a maximum 3.5 L. EXAM: ULTRASOUND-GUIDED PARACENTESIS COMPARISON:  CT DI and pelvis- 12/20/2015; PET-CT - 01/10/2016 MEDICATIONS: None. COMPLICATIONS: None immediate.  TECHNIQUE: Informed written consent was obtained from the patient after a discussion of the risks, benefits and alternatives to treatment. A timeout was performed prior to the initiation of the procedure. Initial ultrasound scanning demonstrates a moderate to large amount of ascites within the right lower abdominal quadrant. The right lower abdomen was prepped and draped in the usual sterile fashion. 1% lidocaine with epinephrine was used for local anesthesia. An ultrasound image was saved for documentation purposed. An 8 Fr Safe-T-Centesis catheter was introduced. The paracentesis was performed. The catheter was removed  and a dressing was applied. The patient tolerated the procedure well without immediate post procedural complication. FINDINGS: A total of approximately 3.5 liters of serous fluid was removed. Samples were sent to the laboratory as requested by the clinical team. Note, there was a small to moderate amount of intra-abdominal ascites left within the abdomen though ordering physician requested a maximum of only 3.5 L. IMPRESSION: Successful ultrasound-guided paracentesis yielding 3.5 liters of peritoneal fluid. Note, there was a small to moderate amount of intra-abdominal ascites left within the abdomen, though the ordering physician requested a maximum of only 3.5 L. Electronically Signed   By: Sandi Mariscal M.D.   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 PET- similar mass limited to the liver. CA-19-9/AFP normal. CA-125 elevated. Paracentesis/cytology negative for malignancy.  Awaiting repeat eval- with MRI/ possible Bx at [facility]. Also plans for Y90 at [facility] as per pt.   # Ascites - repeat tap; cytology ordered; decompensated. As above; continue Lasix/potassium/spirinolactone.  # Patient will tentatively follow-up with me in approximately 3 weeks with labs.      Cammie Sickle, MD 02/08/2016 8:13 AM

## 2016-02-21 DIAGNOSIS — C221 Intrahepatic bile duct carcinoma: Secondary | ICD-10-CM | POA: Diagnosis not present

## 2016-02-21 DIAGNOSIS — R188 Other ascites: Secondary | ICD-10-CM | POA: Diagnosis not present

## 2016-02-21 DIAGNOSIS — I10 Essential (primary) hypertension: Secondary | ICD-10-CM | POA: Diagnosis not present

## 2016-02-21 DIAGNOSIS — D689 Coagulation defect, unspecified: Secondary | ICD-10-CM | POA: Diagnosis not present

## 2016-02-21 DIAGNOSIS — K766 Portal hypertension: Secondary | ICD-10-CM | POA: Diagnosis not present

## 2016-02-21 DIAGNOSIS — R161 Splenomegaly, not elsewhere classified: Secondary | ICD-10-CM | POA: Diagnosis not present

## 2016-02-21 DIAGNOSIS — K7460 Unspecified cirrhosis of liver: Secondary | ICD-10-CM | POA: Diagnosis not present

## 2016-02-21 DIAGNOSIS — J90 Pleural effusion, not elsewhere classified: Secondary | ICD-10-CM | POA: Diagnosis not present

## 2016-02-24 ENCOUNTER — Other Ambulatory Visit: Payer: Self-pay

## 2016-02-24 DIAGNOSIS — C220 Liver cell carcinoma: Secondary | ICD-10-CM

## 2016-02-25 ENCOUNTER — Inpatient Hospital Stay: Payer: 59

## 2016-02-25 ENCOUNTER — Encounter: Payer: Self-pay | Admitting: Internal Medicine

## 2016-02-25 ENCOUNTER — Inpatient Hospital Stay (HOSPITAL_BASED_OUTPATIENT_CLINIC_OR_DEPARTMENT_OTHER): Payer: 59 | Admitting: Internal Medicine

## 2016-02-25 ENCOUNTER — Ambulatory Visit
Admission: RE | Admit: 2016-02-25 | Discharge: 2016-02-25 | Disposition: A | Discharge status: Home / Self Care | Payer: 59 | Source: Ambulatory Visit | Attending: Internal Medicine | Admitting: Internal Medicine

## 2016-02-25 ENCOUNTER — Other Ambulatory Visit: Payer: Self-pay

## 2016-02-25 VITALS — BP 124/71 | HR 79 | Temp 96.2°F | Ht 66.0 in | Wt 205.0 lb

## 2016-02-25 DIAGNOSIS — E119 Type 2 diabetes mellitus without complications: Secondary | ICD-10-CM | POA: Diagnosis not present

## 2016-02-25 DIAGNOSIS — R896 Abnormal cytological findings in specimens from other organs, systems and tissues: Secondary | ICD-10-CM | POA: Insufficient documentation

## 2016-02-25 DIAGNOSIS — R188 Other ascites: Secondary | ICD-10-CM

## 2016-02-25 DIAGNOSIS — I700 Atherosclerosis of aorta: Secondary | ICD-10-CM | POA: Diagnosis not present

## 2016-02-25 DIAGNOSIS — K769 Liver disease, unspecified: Secondary | ICD-10-CM | POA: Diagnosis not present

## 2016-02-25 DIAGNOSIS — K7460 Unspecified cirrhosis of liver: Secondary | ICD-10-CM

## 2016-02-25 DIAGNOSIS — I10 Essential (primary) hypertension: Secondary | ICD-10-CM

## 2016-02-25 DIAGNOSIS — C221 Intrahepatic bile duct carcinoma: Secondary | ICD-10-CM | POA: Insufficient documentation

## 2016-02-25 DIAGNOSIS — R162 Hepatomegaly with splenomegaly, not elsewhere classified: Secondary | ICD-10-CM

## 2016-02-25 DIAGNOSIS — J90 Pleural effusion, not elsewhere classified: Secondary | ICD-10-CM | POA: Diagnosis not present

## 2016-02-25 DIAGNOSIS — R140 Abdominal distension (gaseous): Secondary | ICD-10-CM

## 2016-02-25 DIAGNOSIS — C801 Malignant (primary) neoplasm, unspecified: Secondary | ICD-10-CM | POA: Diagnosis not present

## 2016-02-25 DIAGNOSIS — K766 Portal hypertension: Secondary | ICD-10-CM

## 2016-02-25 DIAGNOSIS — Z803 Family history of malignant neoplasm of breast: Secondary | ICD-10-CM

## 2016-02-25 DIAGNOSIS — K8020 Calculus of gallbladder without cholecystitis without obstruction: Secondary | ICD-10-CM

## 2016-02-25 DIAGNOSIS — R971 Elevated cancer antigen 125 [CA 125]: Secondary | ICD-10-CM

## 2016-02-25 DIAGNOSIS — Z79899 Other long term (current) drug therapy: Secondary | ICD-10-CM

## 2016-02-25 DIAGNOSIS — C220 Liver cell carcinoma: Secondary | ICD-10-CM

## 2016-02-25 DIAGNOSIS — Z23 Encounter for immunization: Secondary | ICD-10-CM

## 2016-02-25 LAB — COMPREHENSIVE METABOLIC PANEL
ALT: 12 U/L — AB (ref 14–54)
AST: 34 U/L (ref 15–41)
Albumin: 2.9 g/dL — ABNORMAL LOW (ref 3.5–5.0)
Alkaline Phosphatase: 83 U/L (ref 38–126)
Anion gap: 4 — ABNORMAL LOW (ref 5–15)
BUN: 15 mg/dL (ref 6–20)
CHLORIDE: 102 mmol/L (ref 101–111)
CO2: 33 mmol/L — AB (ref 22–32)
CREATININE: 0.7 mg/dL (ref 0.44–1.00)
Calcium: 8.8 mg/dL — ABNORMAL LOW (ref 8.9–10.3)
GFR calc non Af Amer: >60 mL/min (ref >60)
Glucose, Bld: 106 mg/dL — ABNORMAL HIGH (ref 65–99)
Potassium: 4.3 mmol/L (ref 3.5–5.1)
SODIUM: 139 mmol/L (ref 135–145)
Total Bilirubin: 1.6 mg/dL — ABNORMAL HIGH (ref 0.3–1.2)
Total Protein: 6.1 g/dL — ABNORMAL LOW (ref 6.5–8.1)

## 2016-02-25 LAB — CBC WITH DIFFERENTIAL/PLATELET
BASOS ABS: 0 10*3/uL (ref 0–0.1)
BASOS PCT: 1 %
EOS ABS: 0 10*3/uL (ref 0–0.7)
EOS PCT: 1 %
HCT: 37.1 % (ref 35.0–47.0)
Hemoglobin: 12.4 g/dL (ref 12.0–16.0)
Lymphocytes Relative: 28 %
Lymphs Abs: 1 10*3/uL (ref 1.0–3.6)
MCH: 28.1 pg (ref 26.0–34.0)
MCHC: 33.4 g/dL (ref 32.0–36.0)
MCV: 84.1 fL (ref 80.0–100.0)
Monocytes Absolute: 0.4 10*3/uL (ref 0.2–0.9)
Monocytes Relative: 12 %
Neutro Abs: 2.1 10*3/uL (ref 1.4–6.5)
Neutrophils Relative %: 58 %
PLATELETS: 156 10*3/uL (ref 150–440)
RBC: 4.41 MIL/uL (ref 3.80–5.20)
RDW: 16.4 % — ABNORMAL HIGH (ref 11.5–14.5)
WBC: 3.6 10*3/uL (ref 3.6–11.0)

## 2016-02-25 LAB — APTT: aPTT: 42 seconds — ABNORMAL HIGH (ref 24–36)

## 2016-02-25 LAB — PROTIME-INR
INR: 1.32
PROTHROMBIN TIME: 16.5 s — AB (ref 11.4–15.2)

## 2016-02-25 NOTE — Assessment & Plan Note (Addendum)
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 Awaiting IR/insurance approval to proceed with y90.   # Systemic chemotherapy with platinum- gemcitabine will be recommended based upon response to y90/ based on further imaging. Hold off systemic therapy at this time.   # Ascites -also noted on MRI of the liver repeat tap; cytology ordered; decompensated. As above; continue Lasix/potassium/spirinolactone.  # Patient will tentatively follow-up with me in approximately 4 weeks with labs.   # also reviewed the records from [place] in detail. Labs- within normal limits.

## 2016-02-25 NOTE — Progress Notes (Signed)
Only concerns are boating

## 2016-02-25 NOTE — Progress Notes (Signed)
[place] NOTE  Patient Care Team: Crecencio Mc, MD as PCP - General (Internal Medicine) Robert Bellow, MD as Consulting Physician (General Surgery) Pollyann Glen, RN as [place] Management Kem Parkinson, [facility] as [place] Management (Pharmacist)  CHIEF COMPLAINTS/PURPOSE OF CONSULTATION:   Oncology History   # July-AUG [phone] x 10 cm lesion in the liver; MRI- enhancing/ left portal vein ;PET- uptake limited to liver. Ca 125- 1500; APF- N; Ca- 19-9-N.   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  female with Newly diagnosed cirrhosis/ ascites- a large liver mass status post liver biopsy at [facility]. She also had MRI for y90. Also met with Dr.Morse/ intervention radiology    Patient denies any worse and shortness of breath. However as noted her abdomen is distended. She denies any significant weight gain. She continues to be on diuretics. Mild to moderate swelling in legs.  No nausea no vomiting. No pain. No shortness of breath.   ROS: A complete 10 point review of system is done which is negative except mentioned above in history of present illness  MEDICAL HISTORY:  Past Medical History:  Diagnosis Date  . Ascites 01/09/2006   Ascites - 3L drawn off on 01/04/2016  . Breast lump in female 2014  . Diabetes mellitus without complication ([place])   . Elevated blood pressure reading   . Hypertension   . Liver mass 01/10/2016   Per patient; bloating.  Mass found via [place], CT, and MRI.      SURGICAL HISTORY: Past Surgical History:  Procedure Laterality Date  . BREAST BIOPSY  1996  . BREAST BIOPSY  2014   Calcifications - [place]  . BREAST LUMPECTOMY    . SHOULDER SURGERY  2011   right , [place]     SOCIAL HISTORY: no smoking/ alcohol.Gillermina Hu.. Self..  Social History   Social History  . Marital status: Divorced    Spouse name: N/A  . Number of children: N/A  . Years of education: N/A   Occupational History  . Not on file.   Social History Main Topics  . Smoking status: Never Smoker  . Smokeless tobacco: Never Used  . Alcohol use No  . Drug use: No  . Sexual activity: Not on file   Other Topics Concern  . Not on file   Social History Narrative  . No narrative on file    FAMILY HISTORY:  Family History  Problem Relation Age of Onset  . Cancer Mother 53    Breast Cancer  . Breast cancer Mother   . Asthma Father   . Diabetes Father     ALLERGIES:  is allergic to adhesive [tape].  MEDICATIONS:  Current Outpatient Prescriptions  Medication Sig Dispense Refill  . cholecalciferol (VITAMIN D) 1000 UNITS tablet Take 1,000 Units by mouth daily. Reported on 12/01/2015    . fluticasone (FLONASE) 50 MCG/ACT nasal spray Place 2 sprays into both nostrils daily. (  Patient taking differently: Place 2 sprays into both nostrils daily as needed. ) 16 g 6  . furosemide (LASIX) 20 MG tablet Take 2 pills every day. 60 tablet 3  . glucose blood (BAYER CONTOUR NEXT TEST) test strip Check sugar daily Dx E11.9 100 each 3  . Lancets (ONETOUCH ULTRASOFT) lancets Use one time daily to scheck blood sugars   250.00 100 each 3  . lisinopril (PRINIVIL,ZESTRIL) 10 MG tablet TAKE 1 TABLET (10 MG TOTAL) BY MOUTH DAILY. 90 tablet 1  . potassium chloride SA (K-DUR,KLOR-CON) 20 MEQ tablet Take 1 tablet (20 mEq total) by mouth 2 (two) times daily. 60 tablet 3  . spironolactone (ALDACTONE) 50 MG tablet Take 1 tablet (50 mg total) by mouth daily. 30 tablet 3  . zoster vaccine live,  PF, (ZOSTAVAX) 95284 UNT/0.65ML injection Inject 19,400 Units into the skin once. 1 each 0   No current facility-administered medications for this visit.       Marland Kitchen  PHYSICAL EXAMINATION: ECOG PERFORMANCE STATUS: 1 - Symptomatic but completely ambulatory  Vitals:   02/25/16 1201  BP: 124/71  Pulse: 79  Temp: (!) 96.2 F (35.7 C)   Filed Weights   02/25/16 1201  Weight: 205 lb (93 kg)    GENERAL: Well-nourished well-developed; Alert, no distress and comfortable.   She is alone. She is walking herself. EYES: no pallor or icterus OROPHARYNX: no thrush or ulceration; good dentition  NECK: supple, no masses felt LYMPH:  no palpable lymphadenopathy in the cervical, axillary or inguinal regions LUNGS: clear to auscultation and  No wheeze or crackles HEART/CVS: regular rate & rhythm and no murmurs; No lower extremity edema ABDOMEN: abdomen soft, non-tender and normal bowel sounds; positive for distention. Positive for hepatomegaly and spelled megaly. Positive for ascites. Musculoskeletal:no cyanosis of digits and no clubbing  PSYCH: alert & oriented x 3 with fluent speech NEURO: no focal motor/sensory deficits SKIN:  no rashes or significant lesions  LABORATORY DATA:  I have reviewed the data as listed Lab Results  Component Value Date   WBC 3.6 02/25/2016   HGB 12.4 02/25/2016   HCT 37.1 02/25/2016   MCV 84.1 02/25/2016   PLT 156 02/25/2016    Recent Labs  12/15/15 1159  12/31/15 1203 01/20/16 1340 02/04/16 1000 02/25/16 1135  NA  --   --  137 135 137 139  K  --   --  3.7 4.4 5.1 4.3  CL  --   --  102 101 104 102  CO2  --   --  '28 27 28 ' 33*  GLUCOSE  --   --  114* 120* 137* 106*  BUN  --   --  '10 12 12 15  ' CREATININE  --   < > 0.54 0.60 0.62 0.70  CALCIUM  --   --  8.6* 8.4* 8.8* 8.8*  GFRNONAA  --   < > >60 >60 >60 >60  GFRAA  --   < > >60 >60 >60 >60  PROT 6.3  --  6.8  --  6.2* 6.1*  ALBUMIN 3.3*  --  3.4*  --  3.1* 2.9*  AST 26  --  32  --  29 34  ALT 10   --  13*  --  13* 12*  ALKPHOS 81  --  90  --  82 83  BILITOT 1.5*  --  1.8*  --  1.7* 1.6*  BILIDIR 0.5*  --   --   --   --   --   < > =  values in this interval not displayed.  RADIOGRAPHIC STUDIES: I have personally reviewed the radiological images as listed and agreed with the findings in the report. US Paracentesis  Result Date: 02/04/2016 INDICATION: Concern for cholangiocarcinoma, now with recurrent symptomatic ascites. Please perform ultrasound-guided paracentesis for diagnostic and therapeutic purposes. Please limit paracentesis to a maximum 3.5 L. EXAM: ULTRASOUND-GUIDED PARACENTESIS COMPARISON:  CT DI and pelvis- 12/20/2015; PET-CT - 01/10/2016 MEDICATIONS: None. COMPLICATIONS: None immediate. TECHNIQUE: Informed written consent was obtained from the patient after a discussion of the risks, benefits and alternatives to treatment. A timeout was performed prior to the initiation of the procedure. Initial ultrasound scanning demonstrates a moderate to large amount of ascites within the right lower abdominal quadrant. The right lower abdomen was prepped and draped in the usual sterile fashion. 1% lidocaine with epinephrine was used for local anesthesia. An ultrasound image was saved for documentation purposed. An 8 Fr Safe-T-Centesis catheter was introduced. The paracentesis was performed. The catheter was removed and a dressing was applied. The patient tolerated the procedure well without immediate post procedural complication. FINDINGS: A total of approximately 3.5 liters of serous fluid was removed. Samples were sent to the laboratory as requested by the clinical team. Note, there was a small to moderate amount of intra-abdominal ascites left within the abdomen though ordering physician requested a maximum of only 3.5 L. IMPRESSION: Successful ultrasound-guided paracentesis yielding 3.5 liters of peritoneal fluid. Note, there was a small to moderate amount of intra-abdominal ascites left within the  abdomen, though the ordering physician requested a maximum of only 3.5 L. Electronically Signed   By: Sandi Mariscal M.D.   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 Awaiting IR/insurance approval to proceed with y90.   # Systemic chemotherapy with platinum- gemcitabine will be recommended based upon response to y90/ based on further imaging. Hold off systemic therapy at this time.   # Ascites -also noted on MRI of the liver repeat tap; cytology ordered; decompensated. As above; continue Lasix/potassium/spirinolactone.  # Patient will tentatively follow-up with me in approximately 4 weeks with labs.   # also reviewed the records from [place] in detail. Labs- within normal limits.    Cammie Sickle, MD 02/25/2016 1:50 PM

## 2016-03-01 DIAGNOSIS — H524 Presbyopia: Secondary | ICD-10-CM | POA: Diagnosis not present

## 2016-03-01 DIAGNOSIS — H5203 Hypermetropia, bilateral: Secondary | ICD-10-CM | POA: Diagnosis not present

## 2016-03-01 LAB — CYTOLOGY - NON PAP

## 2016-03-04 DIAGNOSIS — C221 Intrahepatic bile duct carcinoma: Secondary | ICD-10-CM | POA: Diagnosis not present

## 2016-03-04 DIAGNOSIS — R599 Enlarged lymph nodes, unspecified: Secondary | ICD-10-CM | POA: Diagnosis not present

## 2016-03-04 DIAGNOSIS — R188 Other ascites: Secondary | ICD-10-CM | POA: Diagnosis not present

## 2016-03-06 DIAGNOSIS — C221 Intrahepatic bile duct carcinoma: Secondary | ICD-10-CM | POA: Diagnosis not present

## 2016-03-06 DIAGNOSIS — K766 Portal hypertension: Secondary | ICD-10-CM | POA: Diagnosis not present

## 2016-03-08 ENCOUNTER — Encounter: Payer: Self-pay | Admitting: Internal Medicine

## 2016-03-13 ENCOUNTER — Ambulatory Visit
Admission: RE | Admit: 2016-03-13 | Discharge: 2016-03-13 | Disposition: A | Discharge status: Home / Self Care | Payer: 59 | Source: Ambulatory Visit | Attending: Internal Medicine | Admitting: Internal Medicine

## 2016-03-13 ENCOUNTER — Other Ambulatory Visit: Payer: Self-pay | Admitting: Internal Medicine

## 2016-03-13 ENCOUNTER — Telehealth: Payer: Self-pay | Admitting: *Deleted

## 2016-03-13 DIAGNOSIS — R188 Other ascites: Secondary | ICD-10-CM

## 2016-03-13 DIAGNOSIS — R180 Malignant ascites: Secondary | ICD-10-CM

## 2016-03-13 DIAGNOSIS — K7460 Unspecified cirrhosis of liver: Secondary | ICD-10-CM | POA: Diagnosis not present

## 2016-03-13 DIAGNOSIS — C221 Intrahepatic bile duct carcinoma: Secondary | ICD-10-CM

## 2016-03-13 NOTE — Telephone Encounter (Signed)
Patient contacted Tia in cancer center scheduling. Pt requesting paracentesis today. Radiology will need order. 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 Pt was already given an appointment for this w/o an md order. Will need to ask Dr. Rogue Bussing for an order.

## 2016-03-13 NOTE — Telephone Encounter (Signed)
Spoke with Dr. Rogue Bussing- v/o obtained for paracentesis. Per md v/o to send for fluid cytology only.

## 2016-03-16 DIAGNOSIS — C221 Intrahepatic bile duct carcinoma: Secondary | ICD-10-CM | POA: Diagnosis not present

## 2016-03-16 LAB — CYTOLOGY - NON PAP

## 2016-03-17 DIAGNOSIS — I10 Essential (primary) hypertension: Secondary | ICD-10-CM | POA: Diagnosis not present

## 2016-03-17 DIAGNOSIS — C221 Intrahepatic bile duct carcinoma: Secondary | ICD-10-CM | POA: Diagnosis not present

## 2016-03-17 DIAGNOSIS — K7460 Unspecified cirrhosis of liver: Secondary | ICD-10-CM | POA: Diagnosis not present

## 2016-03-18 DIAGNOSIS — R112 Nausea with vomiting, unspecified: Secondary | ICD-10-CM | POA: Diagnosis not present

## 2016-03-18 DIAGNOSIS — K7460 Unspecified cirrhosis of liver: Secondary | ICD-10-CM | POA: Diagnosis not present

## 2016-03-18 DIAGNOSIS — Z515 Encounter for palliative care: Secondary | ICD-10-CM | POA: Diagnosis not present

## 2016-03-18 DIAGNOSIS — I10 Essential (primary) hypertension: Secondary | ICD-10-CM | POA: Diagnosis not present

## 2016-03-18 DIAGNOSIS — C221 Intrahepatic bile duct carcinoma: Secondary | ICD-10-CM | POA: Diagnosis not present

## 2016-03-20 ENCOUNTER — Encounter: Payer: Self-pay | Admitting: Internal Medicine

## 2016-03-23 ENCOUNTER — Telehealth: Payer: Self-pay | Admitting: *Deleted

## 2016-03-23 ENCOUNTER — Other Ambulatory Visit: Payer: Self-pay | Admitting: *Deleted

## 2016-03-23 DIAGNOSIS — R180 Malignant ascites: Secondary | ICD-10-CM

## 2016-03-23 DIAGNOSIS — C221 Intrahepatic bile duct carcinoma: Secondary | ICD-10-CM

## 2016-03-23 NOTE — Telephone Encounter (Signed)
-----   Message from [place] sent at 03/23/2016 10:25 AM EDT ----- Regarding: US Paracentesis Pt is wanting to have paracentesis done tomorrow, scheduling needs a order. Any questions please call Pamala Hurry @ [phone]. Thanks!

## 2016-03-23 NOTE — Discharge Instructions (Signed)
Paracentesis, Care After °Refer to this sheet in the next few weeks. These instructions provide you with information about caring for yourself after your procedure. Your health care provider may also give you more specific instructions. Your treatment has been planned according to current medical practices, but problems sometimes occur. Call your health care provider if you have any problems or questions after your procedure. °WHAT TO EXPECT AFTER THE PROCEDURE °After your procedure, it is common to have a small amount of clear fluid coming from the puncture site. °HOME CARE INSTRUCTIONS °· Return to your normal activities as told by your health care provider. Ask your health care provider what activities are safe for you. °· Take over-the-counter and prescription medicines only as told by your health care provider. °· Do not take baths, swim, or use a hot tub until your health care provider approves. °· Follow instructions from your health care provider about: °¨ How to take care of your puncture site. °¨ When and how you should change your bandage (dressing). °¨ When you should remove your dressing. °· Check your puncture area every day signs of infection. Watch for: °¨ Redness, swelling, or pain. °¨ Fluid, blood, or pus. °· Keep all follow-up visits as told by your health care provider. This is important. °SEEK MEDICAL CARE IF: °· You have redness, swelling, or pain at your puncture site. °· You start to have more clear fluid coming from your puncture site. °· You have blood or pus coming from your puncture site. °· You have chills. °· You have a fever. °SEEK IMMEDIATE MEDICAL CARE IF: °· You develop chest pain or shortness of breath. °· You develop increasing pain, discomfort, or swelling in your abdomen. °· You feel dizzy or light-headed or you pass out. °  °This information is not intended to replace advice given to you by your health care provider. Make sure you discuss any questions you have with your health  care provider. °  °Document Released: 10/06/2014 Document Reviewed: 10/06/2014 °Elsevier Interactive Patient Education ©2016 Elsevier Inc. ° °

## 2016-03-23 NOTE — Telephone Encounter (Signed)
Spoke with patient. She is requesting an u/s guided paracentesis. V/o to arrange for this procedure tomorrow per v/o Dr. Rogue Bussing.  No lab collection with paracentesis. Pt asked for a standing paracentesis order so that she could personally contact scheduling to arrange.  I explained that Dr. Rogue Bussing declines having a standing order in at this time due to the safety concerns with scheduling with paracentesis.  I personally asked patient to call the cancer center triage for future paracentesis request. She states that she personally understands and will do this for now.  She also inquired if the cancer center received any labs from [place] to be drawn in the cancer ctr on Monday. I explained that as of right now, Dr. B has not received any copies of any lab orders. I would have to ask md if he would be willing to order these labs for the [facility] oncology/IR team. She states that she has this order at home and she will bring these lab orders for outpatient orders tomorrow when she has her paracentesis. [facility]

## 2016-03-23 NOTE — Progress Notes (Signed)
Spoke with patient. She is requesting an u/s guided paracentesis. V/o to arrange for this procedure tomorrow per v/o Dr. Rogue Bussing.  No lab collection with paracentesis.

## 2016-03-23 NOTE — Telephone Encounter (Signed)
-----   Message from [place] sent at 03/23/2016 10:25 AM EDT ----- Regarding: US Paracentesis Pt is wanting to have paracentesis done tomorrow, scheduling needs a order. Any questions please call Pamala Hurry @ [phone]. Thanks!

## 2016-03-24 ENCOUNTER — Ambulatory Visit
Admission: RE | Admit: 2016-03-24 | Discharge: 2016-03-24 | Disposition: A | Discharge status: Home / Self Care | Payer: 59 | Source: Ambulatory Visit | Attending: Internal Medicine | Admitting: Internal Medicine

## 2016-03-24 ENCOUNTER — Inpatient Hospital Stay: Payer: 59

## 2016-03-24 ENCOUNTER — Inpatient Hospital Stay: Payer: 59 | Admitting: Internal Medicine

## 2016-03-24 DIAGNOSIS — R188 Other ascites: Secondary | ICD-10-CM | POA: Diagnosis not present

## 2016-03-24 DIAGNOSIS — C221 Intrahepatic bile duct carcinoma: Secondary | ICD-10-CM | POA: Diagnosis not present

## 2016-03-24 DIAGNOSIS — R180 Malignant ascites: Secondary | ICD-10-CM | POA: Diagnosis not present

## 2016-03-24 NOTE — Procedures (Signed)
Successful US guided paracentesis yielding 3.5 L of serous ascitic fluid. EBL: None No immediate post procedural complications.   Ronny Bacon, MD Pager #: [phone]

## 2016-03-27 ENCOUNTER — Other Ambulatory Visit: Admission: RE | Admit: 2016-03-27 | Payer: 59 | Source: Ambulatory Visit | Admitting: *Deleted

## 2016-03-27 ENCOUNTER — Other Ambulatory Visit
Admission: RE | Admit: 2016-03-27 | Discharge: 2016-03-27 | Disposition: A | Discharge status: Home / Self Care | Payer: 59 | Source: Ambulatory Visit | Attending: Nurse Practitioner | Admitting: Nurse Practitioner

## 2016-03-27 DIAGNOSIS — C221 Intrahepatic bile duct carcinoma: Secondary | ICD-10-CM | POA: Insufficient documentation

## 2016-03-27 DIAGNOSIS — Z01812 Encounter for preprocedural laboratory examination: Secondary | ICD-10-CM | POA: Insufficient documentation

## 2016-03-27 LAB — COMPREHENSIVE METABOLIC PANEL
ALBUMIN: 2.8 g/dL — AB (ref 3.5–5.0)
ALK PHOS: 107 U/L (ref 38–126)
ALT: 17 U/L (ref 14–54)
ANION GAP: 8 (ref 5–15)
AST: 42 U/L — ABNORMAL HIGH (ref 15–41)
BUN: 15 mg/dL (ref 6–20)
CALCIUM: 8.5 mg/dL — AB (ref 8.9–10.3)
CHLORIDE: 101 mmol/L (ref 101–111)
CO2: 28 mmol/L (ref 22–32)
Creatinine, Ser: 0.76 mg/dL (ref 0.44–1.00)
GFR calc Af Amer: >60 mL/min (ref >60)
GFR calc non Af Amer: >60 mL/min (ref >60)
GLUCOSE: 149 mg/dL — AB (ref 65–99)
POTASSIUM: 4.6 mmol/L (ref 3.5–5.1)
SODIUM: 137 mmol/L (ref 135–145)
Total Bilirubin: 1.9 mg/dL — ABNORMAL HIGH (ref 0.3–1.2)
Total Protein: 6.5 g/dL (ref 6.5–8.1)

## 2016-03-27 LAB — CBC
HCT: 41.2 % (ref 35.0–47.0)
HEMOGLOBIN: 13.7 g/dL (ref 12.0–16.0)
MCH: 29.1 pg (ref 26.0–34.0)
MCHC: 33.2 g/dL (ref 32.0–36.0)
MCV: 87.4 fL (ref 80.0–100.0)
PLATELETS: 186 10*3/uL (ref 150–440)
RBC: 4.71 MIL/uL (ref 3.80–5.20)
RDW: 17.6 % — ABNORMAL HIGH (ref 11.5–14.5)
WBC: 4.4 10*3/uL (ref 3.6–11.0)

## 2016-03-27 LAB — PROTIME-INR
INR: 1.11
Prothrombin Time: 14.4 seconds (ref 11.4–15.2)

## 2016-03-31 ENCOUNTER — Ambulatory Visit
Admission: RE | Admit: 2016-03-31 | Discharge: 2016-03-31 | Disposition: A | Discharge status: Home / Self Care | Payer: 59 | Source: Ambulatory Visit | Attending: Oncology | Admitting: Oncology

## 2016-03-31 ENCOUNTER — Other Ambulatory Visit
Admission: RE | Admit: 2016-03-31 | Discharge: 2016-03-31 | Disposition: A | Discharge status: Home / Self Care | Payer: 59 | Source: Ambulatory Visit | Attending: Nurse Practitioner | Admitting: Nurse Practitioner

## 2016-03-31 ENCOUNTER — Telehealth: Payer: Self-pay | Admitting: *Deleted

## 2016-03-31 ENCOUNTER — Other Ambulatory Visit: Payer: Self-pay | Admitting: *Deleted

## 2016-03-31 DIAGNOSIS — R188 Other ascites: Secondary | ICD-10-CM | POA: Diagnosis not present

## 2016-03-31 DIAGNOSIS — Z79899 Other long term (current) drug therapy: Secondary | ICD-10-CM

## 2016-03-31 DIAGNOSIS — C220 Liver cell carcinoma: Secondary | ICD-10-CM | POA: Insufficient documentation

## 2016-03-31 DIAGNOSIS — C221 Intrahepatic bile duct carcinoma: Secondary | ICD-10-CM

## 2016-03-31 LAB — COMPREHENSIVE METABOLIC PANEL
ALT: 16 U/L (ref 14–54)
ANION GAP: 8 (ref 5–15)
AST: 41 U/L (ref 15–41)
Albumin: 2.8 g/dL — ABNORMAL LOW (ref 3.5–5.0)
Alkaline Phosphatase: 106 U/L (ref 38–126)
BUN: 17 mg/dL (ref 6–20)
CHLORIDE: 101 mmol/L (ref 101–111)
CO2: 26 mmol/L (ref 22–32)
Calcium: 8.5 mg/dL — ABNORMAL LOW (ref 8.9–10.3)
Creatinine, Ser: 0.86 mg/dL (ref 0.44–1.00)
GFR calc non Af Amer: >60 mL/min (ref >60)
Glucose, Bld: 153 mg/dL — ABNORMAL HIGH (ref 65–99)
POTASSIUM: 4.9 mmol/L (ref 3.5–5.1)
SODIUM: 135 mmol/L (ref 135–145)
Total Bilirubin: 1.6 mg/dL — ABNORMAL HIGH (ref 0.3–1.2)
Total Protein: 6.5 g/dL (ref 6.5–8.1)

## 2016-03-31 LAB — PROTIME-INR
INR: 1.06
PROTHROMBIN TIME: 13.8 s (ref 11.4–15.2)

## 2016-03-31 LAB — CBC
HCT: 41.2 % (ref 35.0–47.0)
Hemoglobin: 13.7 g/dL (ref 12.0–16.0)
MCH: 28.8 pg (ref 26.0–34.0)
MCHC: 33.3 g/dL (ref 32.0–36.0)
MCV: 86.5 fL (ref 80.0–100.0)
PLATELETS: 154 10*3/uL (ref 150–440)
RBC: 4.77 MIL/uL (ref 3.80–5.20)
RDW: 17.5 % — AB (ref 11.5–14.5)
WBC: 5.4 10*3/uL (ref 3.6–11.0)

## 2016-03-31 NOTE — Procedures (Signed)
US guided paracentesis yielded 3.5 liters of cloudy yellow fluid.  Minimal blood loss and no immediate complication.

## 2016-03-31 NOTE — Telephone Encounter (Signed)
Asking if we will order a paracentesis for her to be done today, she checked with [place] and they can do her at 100 today if we order, she has one done last Friday and she has an appt Monday at [facility] for XRT. She states her abd is very distended again. Please advise for this [place] patient

## 2016-03-31 NOTE — Telephone Encounter (Signed)
Paracentesis order is in.

## 2016-03-31 NOTE — Telephone Encounter (Signed)
Form faxed to scheduling, patient will schedule appt for today, I let her know order is in

## 2016-04-03 DIAGNOSIS — C221 Intrahepatic bile duct carcinoma: Secondary | ICD-10-CM | POA: Diagnosis not present

## 2016-04-04 ENCOUNTER — Encounter: Payer: Self-pay | Admitting: Internal Medicine

## 2016-04-05 ENCOUNTER — Telehealth: Payer: Self-pay | Admitting: Internal Medicine

## 2016-04-05 ENCOUNTER — Telehealth: Payer: Self-pay | Admitting: *Deleted

## 2016-04-05 ENCOUNTER — Other Ambulatory Visit: Payer: Self-pay | Admitting: Internal Medicine

## 2016-04-05 DIAGNOSIS — C220 Liver cell carcinoma: Secondary | ICD-10-CM

## 2016-04-05 DIAGNOSIS — R180 Malignant ascites: Secondary | ICD-10-CM

## 2016-04-05 NOTE — Telephone Encounter (Signed)
Requesting to have a paracentesis done tomorrow or Friday, Had he Y 90 done at [facility] Monday and said the doctor there had mentioned how full of ascites she had. She had paracentesis done this past Friday. Please advise

## 2016-04-05 NOTE — Discharge Instructions (Signed)
Paracentesis, Care After °Refer to this sheet in the next few weeks. These instructions provide you with information about caring for yourself after your procedure. Your health care provider may also give you more specific instructions. Your treatment has been planned according to current medical practices, but problems sometimes occur. Call your health care provider if you have any problems or questions after your procedure. °WHAT TO EXPECT AFTER THE PROCEDURE °After your procedure, it is common to have a small amount of clear fluid coming from the puncture site. °HOME CARE INSTRUCTIONS °· Return to your normal activities as told by your health care provider. Ask your health care provider what activities are safe for you. °· Take over-the-counter and prescription medicines only as told by your health care provider. °· Do not take baths, swim, or use a hot tub until your health care provider approves. °· Follow instructions from your health care provider about: °¨ How to take care of your puncture site. °¨ When and how you should change your bandage (dressing). °¨ When you should remove your dressing. °· Check your puncture area every day signs of infection. Watch for: °¨ Redness, swelling, or pain. °¨ Fluid, blood, or pus. °· Keep all follow-up visits as told by your health care provider. This is important. °SEEK MEDICAL CARE IF: °· You have redness, swelling, or pain at your puncture site. °· You start to have more clear fluid coming from your puncture site. °· You have blood or pus coming from your puncture site. °· You have chills. °· You have a fever. °SEEK IMMEDIATE MEDICAL CARE IF: °· You develop chest pain or shortness of breath. °· You develop increasing pain, discomfort, or swelling in your abdomen. °· You feel dizzy or light-headed or you pass out. °  °This information is not intended to replace advice given to you by your health care provider. Make sure you discuss any questions you have with your health  care provider. °  °Document Released: 10/06/2014 Document Reviewed: 10/06/2014 °Elsevier Interactive Patient Education ©2016 Elsevier Inc. ° °

## 2016-04-05 NOTE — Telephone Encounter (Signed)
Patient advised of time tomorrow for paracentesis to be here at 10 for 1030 procedure and discussed that Dr Rogue Bussing will be calling her to discuss Pleurex catheter placement. She stated she is in agreement with that

## 2016-04-05 NOTE — Telephone Encounter (Signed)
Spoke to pt re: pt pleurex catheter. Pt wants me to talk to Dr. Viviana Simpler [y90] IR at [facility]; at [phone].

## 2016-04-06 ENCOUNTER — Ambulatory Visit
Admission: RE | Admit: 2016-04-06 | Discharge: 2016-04-06 | Disposition: A | Discharge status: Home / Self Care | Payer: 59 | Source: Ambulatory Visit | Attending: Internal Medicine | Admitting: Internal Medicine

## 2016-04-06 DIAGNOSIS — R188 Other ascites: Secondary | ICD-10-CM | POA: Diagnosis not present

## 2016-04-06 DIAGNOSIS — R180 Malignant ascites: Secondary | ICD-10-CM | POA: Diagnosis not present

## 2016-04-06 DIAGNOSIS — C220 Liver cell carcinoma: Secondary | ICD-10-CM | POA: Diagnosis not present

## 2016-04-06 DIAGNOSIS — K7460 Unspecified cirrhosis of liver: Secondary | ICD-10-CM | POA: Diagnosis not present

## 2016-04-06 DIAGNOSIS — C221 Intrahepatic bile duct carcinoma: Secondary | ICD-10-CM | POA: Diagnosis not present

## 2016-04-07 LAB — CYTOLOGY - NON PAP

## 2016-04-14 ENCOUNTER — Other Ambulatory Visit: Payer: Self-pay | Admitting: Internal Medicine

## 2016-04-14 DIAGNOSIS — H6063 Unspecified chronic otitis externa, bilateral: Secondary | ICD-10-CM | POA: Diagnosis not present

## 2016-04-14 DIAGNOSIS — H903 Sensorineural hearing loss, bilateral: Secondary | ICD-10-CM | POA: Diagnosis not present

## 2016-04-14 DIAGNOSIS — H6123 Impacted cerumen, bilateral: Secondary | ICD-10-CM | POA: Diagnosis not present

## 2016-04-15 NOTE — Telephone Encounter (Signed)
I spoke to Dr. Maudie Mercury at [facility]; he agrees with pleurex catheter. Pt needs to follow up with me re: this and also to discuss re: starting chemotherapy. It looks like pt has no appt to see me for now. Can she see me in [place]? On 11/14.

## 2016-04-17 ENCOUNTER — Telehealth: Payer: Self-pay | Admitting: *Deleted

## 2016-04-17 ENCOUNTER — Other Ambulatory Visit: Payer: Self-pay | Admitting: Internal Medicine

## 2016-04-17 ENCOUNTER — Ambulatory Visit
Admission: RE | Admit: 2016-04-17 | Discharge: 2016-04-17 | Disposition: A | Discharge status: Home / Self Care | Payer: 59 | Source: Ambulatory Visit | Attending: Internal Medicine | Admitting: Internal Medicine

## 2016-04-17 DIAGNOSIS — E669 Obesity, unspecified: Principal | ICD-10-CM

## 2016-04-17 DIAGNOSIS — R180 Malignant ascites: Secondary | ICD-10-CM | POA: Diagnosis not present

## 2016-04-17 DIAGNOSIS — C221 Intrahepatic bile duct carcinoma: Secondary | ICD-10-CM | POA: Diagnosis not present

## 2016-04-17 DIAGNOSIS — K7469 Other cirrhosis of liver: Secondary | ICD-10-CM | POA: Diagnosis not present

## 2016-04-17 DIAGNOSIS — R188 Other ascites: Secondary | ICD-10-CM | POA: Diagnosis not present

## 2016-04-17 DIAGNOSIS — E1169 Type 2 diabetes mellitus with other specified complication: Secondary | ICD-10-CM

## 2016-04-17 NOTE — Telephone Encounter (Signed)
Patient contacted this morning with an apt on Wed. With Dr. B and also paracentesis today.

## 2016-04-17 NOTE — Telephone Encounter (Signed)
Pt's apt was sch. On Wed this week per cancer ctr scheduling.

## 2016-04-17 NOTE — Telephone Encounter (Signed)
Patient called this AM to report a very swollen stomach. She would like advice regarding an appointment.

## 2016-04-19 ENCOUNTER — Inpatient Hospital Stay: Payer: 59

## 2016-04-19 ENCOUNTER — Inpatient Hospital Stay: Payer: 59 | Attending: Internal Medicine | Admitting: Internal Medicine

## 2016-04-19 ENCOUNTER — Telehealth: Payer: Self-pay | Admitting: *Deleted

## 2016-04-19 VITALS — BP 120/78 | HR 80 | Temp 98.0°F | Resp 20 | Ht 66.0 in | Wt 207.8 lb

## 2016-04-19 DIAGNOSIS — R188 Other ascites: Secondary | ICD-10-CM | POA: Insufficient documentation

## 2016-04-19 DIAGNOSIS — N63 Unspecified lump in unspecified breast: Secondary | ICD-10-CM | POA: Diagnosis not present

## 2016-04-19 DIAGNOSIS — C221 Intrahepatic bile duct carcinoma: Secondary | ICD-10-CM

## 2016-04-19 DIAGNOSIS — E119 Type 2 diabetes mellitus without complications: Secondary | ICD-10-CM | POA: Diagnosis not present

## 2016-04-19 DIAGNOSIS — I10 Essential (primary) hypertension: Secondary | ICD-10-CM | POA: Insufficient documentation

## 2016-04-19 DIAGNOSIS — Z803 Family history of malignant neoplasm of breast: Secondary | ICD-10-CM | POA: Diagnosis not present

## 2016-04-19 DIAGNOSIS — Z79899 Other long term (current) drug therapy: Secondary | ICD-10-CM | POA: Insufficient documentation

## 2016-04-19 DIAGNOSIS — R7989 Other specified abnormal findings of blood chemistry: Secondary | ICD-10-CM

## 2016-04-19 DIAGNOSIS — R5383 Other fatigue: Secondary | ICD-10-CM | POA: Diagnosis not present

## 2016-04-19 LAB — COMPREHENSIVE METABOLIC PANEL
ALBUMIN: 2.5 g/dL — AB (ref 3.5–5.0)
ALT: 20 U/L (ref 14–54)
AST: 40 U/L (ref 15–41)
Alkaline Phosphatase: 106 U/L (ref 38–126)
Anion gap: 9 (ref 5–15)
BUN: 68 mg/dL — AB (ref 6–20)
CHLORIDE: 100 mmol/L — AB (ref 101–111)
CO2: 24 mmol/L (ref 22–32)
CREATININE: 1.37 mg/dL — AB (ref 0.44–1.00)
Calcium: 8.4 mg/dL — ABNORMAL LOW (ref 8.9–10.3)
GFR calc Af Amer: 44 mL/min — ABNORMAL LOW (ref >60)
GFR, EST NON AFRICAN AMERICAN: 38 mL/min — AB (ref >60)
GLUCOSE: 141 mg/dL — AB (ref 65–99)
Potassium: 5.2 mmol/L — ABNORMAL HIGH (ref 3.5–5.1)
SODIUM: 133 mmol/L — AB (ref 135–145)
Total Bilirubin: 2.2 mg/dL — ABNORMAL HIGH (ref 0.3–1.2)
Total Protein: 6.1 g/dL — ABNORMAL LOW (ref 6.5–8.1)

## 2016-04-19 LAB — CBC WITH DIFFERENTIAL/PLATELET
BASOS ABS: 0.1 10*3/uL (ref 0–0.1)
BASOS PCT: 2 %
EOS PCT: 1 %
Eosinophils Absolute: 0 10*3/uL (ref 0–0.7)
HCT: 42.9 % (ref 35.0–47.0)
Hemoglobin: 14.3 g/dL (ref 12.0–16.0)
LYMPHS PCT: 7 %
Lymphs Abs: 0.5 10*3/uL — ABNORMAL LOW (ref 1.0–3.6)
MCH: 28.9 pg (ref 26.0–34.0)
MCHC: 33.4 g/dL (ref 32.0–36.0)
MCV: 86.6 fL (ref 80.0–100.0)
Monocytes Absolute: 1.1 10*3/uL — ABNORMAL HIGH (ref 0.2–0.9)
Monocytes Relative: 17 %
Neutro Abs: 5.2 10*3/uL (ref 1.4–6.5)
Neutrophils Relative %: 75 %
PLATELETS: 155 10*3/uL (ref 150–440)
RBC: 4.95 MIL/uL (ref 3.80–5.20)
RDW: 18.3 % — ABNORMAL HIGH (ref 11.5–14.5)
WBC: 6.9 10*3/uL (ref 3.6–11.0)

## 2016-04-19 LAB — PROTIME-INR
INR: 1.17
Prothrombin Time: 15 seconds (ref 11.4–15.2)

## 2016-04-19 LAB — APTT: aPTT: 40 seconds — ABNORMAL HIGH (ref 24–36)

## 2016-04-19 LAB — CYTOLOGY - NON PAP

## 2016-04-19 NOTE — Progress Notes (Signed)
Patient here for followup with Dr. Rogue Bussing. Md would like to discuss the role of a pleurx cath mgmt for pt's ascities.  Pt education provided. Pt will think about her options about the pleurx cath. Pt given pt education booklet and dvd for pleurx cath.

## 2016-04-19 NOTE — Telephone Encounter (Signed)
Spoke with patient. Patient has been holding her lisinopril due to hypotension episodes.  Teach back process performed to hold lisinopril and aldactone and continue lasix. Repeat labs on 11/22 at 1pm.

## 2016-04-19 NOTE — Telephone Encounter (Signed)
-----   Message from Cammie Sickle, MD sent at 04/19/2016  4:26 PM EST ----- Please inform patient that her creatinine is slightly up at 1.3/recommend holding lisinopril and Aldactone for 1 week; continue Lasix for now. Orders repeat BMP in 1 week; will then recommend restarting her medications.

## 2016-04-19 NOTE — Progress Notes (Signed)
[place] NOTE  Patient Care Team: Crecencio Mc, MD as PCP - General (Internal Medicine) Robert Bellow, MD as Consulting Physician (General Surgery) Pollyann Glen, RN as [place] Management Kem Parkinson, [facility] as [place] Management (Pharmacist)  CHIEF COMPLAINTS/PURPOSE OF CONSULTATION:   Oncology History   # July-AUG [phone] x 10 cm lesion in the liver; MRI- enhancing/ left portal vein ;PET- uptake limited to liver. Ca 125- 1500; APF- N; Ca- 19-9-N.   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  female with Cholangiocarcinoma intrahepatic status post y90  few weeks ago is here for follow-up. Patient had been extremely tired over the last few days. This has gotten significantly worse in the last 1-2 days post paracentesis of about 4 L of fluid. Patient has been needing multiple paracentesis almost once every 7-10 days.   Patient denies any worse and shortness of breath. She continues to be on diuretics. Mild to moderate swelling in legs.  No nausea no vomiting. No pain. No shortness of breath. Poor appetite.   ROS: A complete 10 point review of system is done which is negative except mentioned above in history of present illness  MEDICAL HISTORY:  Past Medical History:  Diagnosis Date  . Ascites 01/09/2006   Ascites - 3L drawn off on 01/04/2016  . Breast lump in female 2014  . Diabetes mellitus without complication ([place])   . Elevated blood pressure reading   . Hypertension   .  Liver mass 01/10/2016   Per patient; bloating.  Mass found via [place], CT, and MRI.     SURGICAL HISTORY: Past Surgical History:  Procedure Laterality Date  . BREAST BIOPSY  1996  . BREAST BIOPSY  2014   Calcifications - [place]  . BREAST LUMPECTOMY    . SHOULDER SURGERY  2011   right , [place]     SOCIAL HISTORY: no smoking/ alcohol.Gillermina Hu.. Self..  Social History   Social History  . Marital status: Divorced    Spouse name: N/A  . Number of children: N/A  . Years of education: N/A   Occupational History  . Not on file.   Social History Main Topics  . Smoking status: Never Smoker  . Smokeless tobacco: Never Used  . Alcohol use No  . Drug use: No  . Sexual activity: Not on file   Other Topics Concern  . Not on file   Social History Narrative  . No narrative on file    FAMILY HISTORY:  Family History  Problem Relation Age of Onset  . Cancer Mother 38    Breast Cancer  . Breast cancer Mother   . Asthma Father   . Diabetes Father     ALLERGIES:  is allergic to adhesive [tape].  MEDICATIONS:  Current Outpatient Prescriptions  Medication Sig Dispense Refill  . BAYER CONTOUR NEXT TEST test strip CHECK SUGAR DAILY 100 each 3  . cholecalciferol (VITAMIN D) 1000  UNITS tablet Take 1,000 Units by mouth daily. Reported on 12/01/2015    . fluticasone (FLONASE) 50 MCG/ACT nasal spray Place 2 sprays into both nostrils daily. (Patient taking differently: Place 2 sprays into both nostrils daily as needed. ) 16 g 6  . furosemide (LASIX) 20 MG tablet Take 2 pills every day. 60 tablet 3  . Lancets (ONETOUCH ULTRASOFT) lancets Use one time daily to scheck blood sugars   250.00 100 each 3  . lisinopril (PRINIVIL,ZESTRIL) 10 MG tablet TAKE 1 TABLET (10 MG TOTAL) BY MOUTH DAILY. 90 tablet 1  . potassium chloride SA (K-DUR,KLOR-CON) 20 MEQ tablet Take 1 tablet (20 mEq total) by mouth 2 (two) times daily. 60 tablet 3  . spironolactone (ALDACTONE) 50 MG tablet Take 1 tablet (50 mg  total) by mouth daily. 30 tablet 3   No current facility-administered medications for this visit.       Marland Kitchen  PHYSICAL EXAMINATION: ECOG PERFORMANCE STATUS: 1 - Symptomatic but completely ambulatory  Vitals:   04/19/16 1229  BP: 120/78  Pulse: 80  Resp: 20  Temp: 98 F (36.7 C)   Filed Weights   04/19/16 1229  Weight: 207 lb 12.8 oz (94.3 kg)    GENERAL: Well-nourished well-developed; Alert, no distress and comfortable.   She is accompanied by a friend. She is walking herself. EYES: no pallor or icterus OROPHARYNX: no thrush or ulceration; good dentition  NECK: supple, no masses felt LYMPH:  no palpable lymphadenopathy in the cervical, axillary or inguinal regions LUNGS: clear to auscultation and  No wheeze or crackles HEART/CVS: regular rate & rhythm and no murmurs; No lower extremity edema ABDOMEN: abdomen soft, non-tender and normal bowel sounds; positive for distention. Positive for hepatomegaly and splenomegaly. Positive for ascites. Musculoskeletal:no cyanosis of digits and no clubbing  PSYCH: alert & oriented x 3 with fluent speech NEURO: no focal motor/sensory deficits SKIN:  no rashes or significant lesions  LABORATORY DATA:  I have reviewed the data as listed Lab Results  Component Value Date   WBC 6.9 04/19/2016   HGB 14.3 04/19/2016   HCT 42.9 04/19/2016   MCV 86.6 04/19/2016   PLT 155 04/19/2016    Recent Labs  12/15/15 1159  02/25/16 1135 03/27/16 1153 03/31/16 1246  NA  --   < > 139 137 135  K  --   < > 4.3 4.6 4.9  CL  --   < > 102 101 101  CO2  --   < > 33* 28 26  GLUCOSE  --   < > 106* 149* 153*  BUN  --   < > 15 15 17   CREATININE  --   < > 0.70 0.76 0.86  CALCIUM  --   < > 8.8* 8.5* 8.5*  GFRNONAA  --   < > >60 >60 >60  GFRAA  --   < > >60 >60 >60  PROT 6.3  < > 6.1* 6.5 6.5  ALBUMIN 3.3*  < > 2.9* 2.8* 2.8*  AST 26  < > 34 42* 41  ALT 10  < > 12* 17 16  ALKPHOS 81  < > 83 107 106  BILITOT 1.5*  < > 1.6* 1.9* 1.6*  BILIDIR 0.5*  --    --   --   --   < > = values in this interval not displayed.  RADIOGRAPHIC STUDIES: I have personally reviewed the radiological images as listed and agreed with the findings in the report. US Paracentesis  Result Date: 04/17/2016 INDICATION: Ascites EXAM: ULTRASOUND GUIDED RIGHT LOWER QUADRANT PARACENTESIS MEDICATIONS: None. COMPLICATIONS: None immediate. PROCEDURE: Informed written consent was obtained from the patient after a discussion of the risks, benefits and alternatives to treatment. A timeout was performed prior to the initiation of the procedure. Initial ultrasound scanning demonstrates a large amount of ascites within the right lower abdominal quadrant. The right lower abdomen was prepped and draped in the usual sterile fashion. 1% lidocaine with epinephrine was used for local anesthesia. Following this, a Safe-T-Centesis catheter was introduced. An ultrasound image was saved for documentation purposes. The paracentesis was performed. The catheter was removed and a dressing was applied. The patient tolerated the procedure well without immediate post procedural complication. FINDINGS: A total of approximately 4 L of clear yellow fluid was removed. IMPRESSION: Successful ultrasound-guided paracentesis yielding 4 liters of peritoneal fluid. Electronically Signed   By: Marybelle Killings M.D.   On: 04/17/2016 15:29   US Paracentesis  Result Date: 04/06/2016 INDICATION: 70 year old female with metastatic cholangiocarcinoma and recurrent malignant ascites. EXAM: ULTRASOUND GUIDED  PARACENTESIS MEDICATIONS: None. COMPLICATIONS: None immediate. PROCEDURE: Informed written consent was obtained from the patient after a discussion of the risks, benefits and alternatives to treatment. A timeout was performed prior to the initiation of the procedure. Initial ultrasound scanning demonstrates a large amount of ascites within the right lower abdominal quadrant. The right lower abdomen was prepped and draped in the  usual sterile fashion. 1% lidocaine with epinephrine was used for local anesthesia. Following this, a 6 Fr Safe-T-Centesis catheter was introduced. An ultrasound image was saved for documentation purposes. The paracentesis was performed. The catheter was removed and a dressing was applied. The patient tolerated the procedure well without immediate post procedural complication. FINDINGS: A total of approximately 4 L of cloudy ascitic fluid was removed. IMPRESSION: Successful ultrasound-guided paracentesis yielding 4 liters of peritoneal fluid. Electronically Signed   By: Jacqulynn Cadet M.D.   On: 04/06/2016 12:16   US Paracentesis  Result Date: 03/31/2016 INDICATION: Recurrent ascites and cholangiocarcinoma. EXAM: ULTRASOUND GUIDED PARACENTESIS MEDICATIONS: None. COMPLICATIONS: None immediate. PROCEDURE: Informed written consent was obtained from the patient after a discussion of the risks, benefits and alternatives to treatment. A timeout was performed prior to the initiation of the procedure. Initial ultrasound scanning demonstrates a large amount of ascites within the left lower abdominal quadrant. The left lower abdomen was prepped and draped in the usual sterile fashion. 1% lidocaine with epinephrine was used for local anesthesia. Following this, a 6 Fr Safe-T-Centesis catheter was introduced. An ultrasound image was saved for documentation purposes. The paracentesis was performed. The catheter was removed and a dressing was applied. The patient tolerated the procedure well without immediate post procedural complication. FINDINGS: A total of approximately 3.5 of cloudy yellow fluid was removed. IMPRESSION: Successful ultrasound-guided paracentesis yielding 3.5 liters of peritoneal fluid. Electronically Signed   By: Markus Daft M.D.   On: 03/31/2016 14:25   US Paracentesis  Result Date: 03/24/2016 INDICATION: History of cholangiocarcinoma, now with recurrent symptomatic ascites. Request made for  aspiration of recurrent symptomatic ascites. Maximum volume of 3.5 L has been requested. EXAM: ULTRASOUND-GUIDED PARACENTESIS COMPARISON:  Ultrasound-guided paracentesis - 03/13/2016; 02/25/2016 MEDICATIONS: None. COMPLICATIONS: None immediate. TECHNIQUE: Informed written consent was obtained from the patient after a discussion of the risks, benefits and alternatives to treatment. A timeout was performed prior to the initiation of the procedure. Initial ultrasound scanning demonstrates a large amount of ascites within the right lower abdominal quadrant. The right  lower abdomen was prepped and draped in the usual sterile fashion. 1% lidocaine with epinephrine was used for local anesthesia. An ultrasound image was saved for documentation purposed. An 8 Fr Safe-T-Centesis catheter was introduced. The paracentesis was performed. The catheter was removed and a dressing was applied. The patient tolerated the procedure well without immediate post procedural complication. FINDINGS: A total of approximately 3.5 liters of serous fluid was removed. IMPRESSION: Successful ultrasound-guided paracentesis yielding 3.5 liters of peritoneal fluid. Electronically Signed   By: Sandi Mariscal M.D.   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 Status post Y 90 few weeks ago. We'll recommend a repeat MRI in the next few months to evaluate for response.  # Ascites/large volume needing frequent paracentesis- x4-cytology negative. discussed with the patient regarding the need of a Pleurx catheter. Hopefully, avoiding large volume paracentesis/fluid shifts. Patient wants to think about this and let us know. Continue Lasix/potassium/spirinolactone.  # Given the frequent paracentesis for recurrent ascites- I discussed the need for systemic chemotherapy- gemcitabine-split dose cisplatin. I  discussed the modest response rates; the potential side effects of chemotherapy. Patient is very concerned about the side effects; reluctant to start chemotherapy. I discussed with patient at length/friend. I also encouraged them to make an appointment with Dr. Leamon Arnt to discuss systemic chemotherapy options. She agrees.   # Today check CBC CMP PT PTT; reevaluate in approximately 3 weeks/labs. Hopefully the patient has made some decisions by then.  # Also discussed with Dr. Maudie Mercury; Dr. Leamon Arnt- regarding the patient's care.    Cammie Sickle, MD 04/19/2016 1:43 PM

## 2016-04-19 NOTE — Assessment & Plan Note (Addendum)
Likely cholangiocarcinoma based on liver biopsy at [facility] ; 10 x 10 cm enhancing Mass in the liver on the CT scan. Status post Y 90 few weeks ago. We'll recommend a repeat MRI in the next few months to evaluate for response.  # Ascites/large volume needing frequent paracentesis- x4-cytology negative. discussed with the patient regarding the need of a Pleurx catheter. Hopefully, avoiding large volume paracentesis/fluid shifts. Patient wants to think about this and let us know. Continue Lasix/potassium/spirinolactone.  # Given the frequent paracentesis for recurrent ascites- I discussed the need for systemic chemotherapy- gemcitabine-split dose cisplatin. I discussed the modest response rates; the potential side effects of chemotherapy. Patient is very concerned about the side effects; reluctant to start chemotherapy. I discussed with patient at length/friend. I also encouraged them to make an appointment with Dr. Leamon Arnt to discuss systemic chemotherapy options. She agrees.   # Today check CBC CMP PT PTT; reevaluate in approximately 3 weeks/labs. Hopefully the patient has made some decisions by then.  # Also discussed with Dr. Maudie Mercury; Dr. Leamon Arnt- regarding the patient's care.

## 2016-04-26 ENCOUNTER — Inpatient Hospital Stay: Payer: 59

## 2016-04-26 ENCOUNTER — Telehealth: Payer: Self-pay | Admitting: Internal Medicine

## 2016-04-26 DIAGNOSIS — I10 Essential (primary) hypertension: Secondary | ICD-10-CM | POA: Diagnosis not present

## 2016-04-26 DIAGNOSIS — R7989 Other specified abnormal findings of blood chemistry: Secondary | ICD-10-CM

## 2016-04-26 DIAGNOSIS — E119 Type 2 diabetes mellitus without complications: Secondary | ICD-10-CM | POA: Diagnosis not present

## 2016-04-26 DIAGNOSIS — C221 Intrahepatic bile duct carcinoma: Secondary | ICD-10-CM | POA: Diagnosis not present

## 2016-04-26 DIAGNOSIS — R188 Other ascites: Secondary | ICD-10-CM | POA: Diagnosis not present

## 2016-04-26 DIAGNOSIS — Z803 Family history of malignant neoplasm of breast: Secondary | ICD-10-CM | POA: Diagnosis not present

## 2016-04-26 DIAGNOSIS — N63 Unspecified lump in unspecified breast: Secondary | ICD-10-CM | POA: Diagnosis not present

## 2016-04-26 DIAGNOSIS — R5383 Other fatigue: Secondary | ICD-10-CM | POA: Diagnosis not present

## 2016-04-26 DIAGNOSIS — Z79899 Other long term (current) drug therapy: Secondary | ICD-10-CM | POA: Diagnosis not present

## 2016-04-26 LAB — BASIC METABOLIC PANEL
ANION GAP: 7 (ref 5–15)
BUN: 56 mg/dL — ABNORMAL HIGH (ref 6–20)
CHLORIDE: 96 mmol/L — AB (ref 101–111)
CO2: 27 mmol/L (ref 22–32)
Calcium: 8.5 mg/dL — ABNORMAL LOW (ref 8.9–10.3)
Creatinine, Ser: 1.13 mg/dL — ABNORMAL HIGH (ref 0.44–1.00)
GFR calc non Af Amer: 48 mL/min — ABNORMAL LOW (ref >60)
GFR, EST AFRICAN AMERICAN: 55 mL/min — AB (ref >60)
Glucose, Bld: 122 mg/dL — ABNORMAL HIGH (ref 65–99)
Potassium: 5.7 mmol/L — ABNORMAL HIGH (ref 3.5–5.1)
Sodium: 130 mmol/L — ABNORMAL LOW (ref 135–145)

## 2016-04-26 NOTE — Telephone Encounter (Signed)
K- 5.7; Pt holding lisinopril/ aldactone/k dur. Currently 20 lasix BID; recommend lasix 20 TID; we will re-check bmp on 11/28-   Heather please order bmp- Thx

## 2016-05-02 ENCOUNTER — Telehealth: Payer: Self-pay | Admitting: *Deleted

## 2016-05-02 ENCOUNTER — Other Ambulatory Visit: Payer: Self-pay | Admitting: Internal Medicine

## 2016-05-02 ENCOUNTER — Inpatient Hospital Stay: Payer: 59

## 2016-05-02 DIAGNOSIS — R188 Other ascites: Secondary | ICD-10-CM | POA: Diagnosis not present

## 2016-05-02 DIAGNOSIS — E119 Type 2 diabetes mellitus without complications: Secondary | ICD-10-CM | POA: Diagnosis not present

## 2016-05-02 DIAGNOSIS — Z79899 Other long term (current) drug therapy: Secondary | ICD-10-CM | POA: Diagnosis not present

## 2016-05-02 DIAGNOSIS — I10 Essential (primary) hypertension: Secondary | ICD-10-CM | POA: Diagnosis not present

## 2016-05-02 DIAGNOSIS — R5383 Other fatigue: Secondary | ICD-10-CM | POA: Diagnosis not present

## 2016-05-02 DIAGNOSIS — N63 Unspecified lump in unspecified breast: Secondary | ICD-10-CM | POA: Diagnosis not present

## 2016-05-02 DIAGNOSIS — Z01812 Encounter for preprocedural laboratory examination: Secondary | ICD-10-CM

## 2016-05-02 DIAGNOSIS — C221 Intrahepatic bile duct carcinoma: Secondary | ICD-10-CM | POA: Diagnosis not present

## 2016-05-02 DIAGNOSIS — Z803 Family history of malignant neoplasm of breast: Secondary | ICD-10-CM | POA: Diagnosis not present

## 2016-05-02 LAB — BASIC METABOLIC PANEL
ANION GAP: 6 (ref 5–15)
BUN: 40 mg/dL — ABNORMAL HIGH (ref 6–20)
CALCIUM: 8.6 mg/dL — AB (ref 8.9–10.3)
CHLORIDE: 99 mmol/L — AB (ref 101–111)
CO2: 28 mmol/L (ref 22–32)
CREATININE: 1.15 mg/dL — AB (ref 0.44–1.00)
GFR calc non Af Amer: 47 mL/min — ABNORMAL LOW (ref >60)
GFR, EST AFRICAN AMERICAN: 54 mL/min — AB (ref >60)
Glucose, Bld: 142 mg/dL — ABNORMAL HIGH (ref 65–99)
Potassium: 5.8 mmol/L — ABNORMAL HIGH (ref 3.5–5.1)
SODIUM: 133 mmol/L — AB (ref 135–145)

## 2016-05-02 MED ORDER — SODIUM POLYSTYRENE SULFONATE PO POWD: ORAL | 0 refills | Status: DC

## 2016-05-02 NOTE — Telephone Encounter (Signed)
Per Dr Rogue Bussing, no paracentesis. He is going to send in Rx for [facility] and he wants to recheck her potassium on Friday. She has agreed to 1145 Friday and will go to Pharmacy to get med

## 2016-05-02 NOTE — Telephone Encounter (Signed)
Per , needs a BMP drawn first to determine kidney functions before he will considering, left message on patient VM to come for labs

## 2016-05-02 NOTE — Telephone Encounter (Signed)
Requesting an order and appt be made for a paracentesis for tomorrow afternoon, states Dr Reesa Chew has an appt available. Please call her when arranged

## 2016-05-02 NOTE — Telephone Encounter (Signed)
Called back to inform Dr Rogue Bussing that she has an appt on 12/5 with Dr Leamon Arnt at [facility]

## 2016-05-04 NOTE — Progress Notes (Signed)
Nutrition  RN, Jennifer Ruiz requesting food list of high potassium foods as patient with elevated K level.  Handouts given to RN as patient schedule for visit tomorrow.  RD not in clinic on Friday, Dec 1.   Jennifer Ruiz, [place], [facility] (pager)

## 2016-05-05 ENCOUNTER — Inpatient Hospital Stay: Payer: 59 | Attending: Internal Medicine

## 2016-05-05 DIAGNOSIS — K7460 Unspecified cirrhosis of liver: Secondary | ICD-10-CM | POA: Diagnosis not present

## 2016-05-05 DIAGNOSIS — E875 Hyperkalemia: Secondary | ICD-10-CM | POA: Diagnosis not present

## 2016-05-05 DIAGNOSIS — C220 Liver cell carcinoma: Secondary | ICD-10-CM

## 2016-05-05 DIAGNOSIS — N63 Unspecified lump in unspecified breast: Secondary | ICD-10-CM | POA: Diagnosis not present

## 2016-05-05 DIAGNOSIS — R188 Other ascites: Secondary | ICD-10-CM | POA: Diagnosis not present

## 2016-05-05 DIAGNOSIS — M7989 Other specified soft tissue disorders: Secondary | ICD-10-CM | POA: Diagnosis not present

## 2016-05-05 DIAGNOSIS — C778 Secondary and unspecified malignant neoplasm of lymph nodes of multiple regions: Secondary | ICD-10-CM | POA: Diagnosis not present

## 2016-05-05 DIAGNOSIS — C221 Intrahepatic bile duct carcinoma: Secondary | ICD-10-CM | POA: Diagnosis not present

## 2016-05-05 DIAGNOSIS — N179 Acute kidney failure, unspecified: Secondary | ICD-10-CM | POA: Diagnosis not present

## 2016-05-05 DIAGNOSIS — Z79899 Other long term (current) drug therapy: Secondary | ICD-10-CM | POA: Diagnosis not present

## 2016-05-05 DIAGNOSIS — E119 Type 2 diabetes mellitus without complications: Secondary | ICD-10-CM | POA: Insufficient documentation

## 2016-05-05 DIAGNOSIS — Z803 Family history of malignant neoplasm of breast: Secondary | ICD-10-CM | POA: Insufficient documentation

## 2016-05-05 DIAGNOSIS — I10 Essential (primary) hypertension: Secondary | ICD-10-CM | POA: Diagnosis not present

## 2016-05-05 DIAGNOSIS — R944 Abnormal results of kidney function studies: Secondary | ICD-10-CM | POA: Insufficient documentation

## 2016-05-05 LAB — POTASSIUM: Potassium: 4 mmol/L (ref 3.5–5.1)

## 2016-05-08 ENCOUNTER — Encounter: Payer: Self-pay | Admitting: *Deleted

## 2016-05-08 ENCOUNTER — Inpatient Hospital Stay: Payer: 59

## 2016-05-08 ENCOUNTER — Other Ambulatory Visit: Payer: Self-pay

## 2016-05-08 ENCOUNTER — Telehealth: Payer: Self-pay | Admitting: *Deleted

## 2016-05-08 ENCOUNTER — Other Ambulatory Visit: Payer: Self-pay | Admitting: Internal Medicine

## 2016-05-08 DIAGNOSIS — R944 Abnormal results of kidney function studies: Secondary | ICD-10-CM | POA: Diagnosis not present

## 2016-05-08 DIAGNOSIS — C221 Intrahepatic bile duct carcinoma: Secondary | ICD-10-CM

## 2016-05-08 DIAGNOSIS — R188 Other ascites: Secondary | ICD-10-CM

## 2016-05-08 DIAGNOSIS — N179 Acute kidney failure, unspecified: Secondary | ICD-10-CM | POA: Diagnosis not present

## 2016-05-08 DIAGNOSIS — N63 Unspecified lump in unspecified breast: Secondary | ICD-10-CM | POA: Diagnosis not present

## 2016-05-08 DIAGNOSIS — K7460 Unspecified cirrhosis of liver: Secondary | ICD-10-CM | POA: Diagnosis not present

## 2016-05-08 DIAGNOSIS — C778 Secondary and unspecified malignant neoplasm of lymph nodes of multiple regions: Secondary | ICD-10-CM | POA: Diagnosis not present

## 2016-05-08 DIAGNOSIS — M7989 Other specified soft tissue disorders: Secondary | ICD-10-CM | POA: Diagnosis not present

## 2016-05-08 DIAGNOSIS — E875 Hyperkalemia: Secondary | ICD-10-CM | POA: Diagnosis not present

## 2016-05-08 LAB — CBC WITH DIFFERENTIAL/PLATELET
Basophils Absolute: 0.1 10*3/uL (ref 0–0.1)
Basophils Relative: 1 %
EOS PCT: 1 %
Eosinophils Absolute: 0 10*3/uL (ref 0–0.7)
HCT: 36.7 % (ref 35.0–47.0)
HEMOGLOBIN: 12.4 g/dL (ref 12.0–16.0)
LYMPHS ABS: 0.6 10*3/uL — AB (ref 1.0–3.6)
LYMPHS PCT: 15 %
MCH: 29.5 pg (ref 26.0–34.0)
MCHC: 33.7 g/dL (ref 32.0–36.0)
MCV: 87.4 fL (ref 80.0–100.0)
Monocytes Absolute: 0.7 10*3/uL (ref 0.2–0.9)
Monocytes Relative: 15 %
NEUTROS PCT: 68 %
Neutro Abs: 3 10*3/uL (ref 1.4–6.5)
Platelets: 162 10*3/uL (ref 150–440)
RBC: 4.19 MIL/uL (ref 3.80–5.20)
RDW: 19 % — ABNORMAL HIGH (ref 11.5–14.5)
WBC: 4.4 10*3/uL (ref 3.6–11.0)

## 2016-05-08 LAB — COMPREHENSIVE METABOLIC PANEL
ALK PHOS: 101 U/L (ref 38–126)
ALT: 19 U/L (ref 14–54)
AST: 40 U/L (ref 15–41)
Albumin: 2.5 g/dL — ABNORMAL LOW (ref 3.5–5.0)
Anion gap: 9 (ref 5–15)
BUN: 44 mg/dL — ABNORMAL HIGH (ref 6–20)
CALCIUM: 8.4 mg/dL — AB (ref 8.9–10.3)
CO2: 28 mmol/L (ref 22–32)
CREATININE: 1.31 mg/dL — AB (ref 0.44–1.00)
Chloride: 97 mmol/L — ABNORMAL LOW (ref 101–111)
GFR, EST AFRICAN AMERICAN: 46 mL/min — AB (ref >60)
GFR, EST NON AFRICAN AMERICAN: 40 mL/min — AB (ref >60)
Glucose, Bld: 126 mg/dL — ABNORMAL HIGH (ref 65–99)
Potassium: 4.4 mmol/L (ref 3.5–5.1)
Sodium: 134 mmol/L — ABNORMAL LOW (ref 135–145)
Total Bilirubin: 2.5 mg/dL — ABNORMAL HIGH (ref 0.3–1.2)
Total Protein: 5.9 g/dL — ABNORMAL LOW (ref 6.5–8.1)

## 2016-05-08 LAB — PROTIME-INR
INR: 1.25
PROTHROMBIN TIME: 15.8 s — AB (ref 11.4–15.2)

## 2016-05-08 LAB — APTT: APTT: 42 s — AB (ref 24–36)

## 2016-05-08 NOTE — Progress Notes (Signed)
Nutrition Assessment   Reason for Assessment:   Referral received regarding high potassium foods  ASSESSMENT:  70 year old female with cholangiocarcinoma with liver mass with ascites and large volume paracentesis Past medical history DM, HTN  Patient reports for the past 2-3 weeks appetite has been great and she has been eating well.  Wanted to focus on low sodium foods and low potassium foods.     Medications: lasix  Labs: K 4.0 (12/1), BUN 40, creatinine 1.15  Anthropometrics:   Height: 66 inches Weight: 207 lb UBW: 222 lb (pt reports in July 2017) BMI: 33   Estimated Energy Needs  Kcals: 2000-2300 calories/d Protein: 94 g/d Fluid: 2.3 L/d  NUTRITION DIAGNOSIS: Food and nutrition related knowledge deficit related to elevated potassium levels as evidenced by lack of knowledge related to foods high in potassium   INTERVENTION:   Discussed foods high in potassium and handout given.  Questions answered.   Also discussed foods low in sodium.   Discussed option for patient to try boost breeze supplement for added nutrition (low in K)    MONITORING, EVALUATION, GOAL: Patient to avoid/limit high K to assist with keeping potassium level in check.    NEXT VISIT: as needed  Baldwin Racicot B. Zenia Resides, [place], [place] (pager)

## 2016-05-08 NOTE — Patient Instructions (Signed)
Patient instructed to keep paracentesis apt tomorrow.  md personally reviewed labs. Ok to proceed with paracentesis from safety point of view. Labs stable. Pt gave verbal understanding and instructed to come at 1230pm for paracentesis on 05/09/16.

## 2016-05-08 NOTE — Telephone Encounter (Signed)
Come in for labs, depending on results, will order paracentesis. Patient will be here for labs prior to her appt with dietician today. OK to wait til tomorrow for para

## 2016-05-08 NOTE — Telephone Encounter (Signed)
Called to report that Dr Leamon Arnt called her Saturday and has cancelled her appt for tomorrow stating that we are going to draw the same labs he was planning and that he has nothing new to discuss with her. She is planning to keep her appt with you Wed. She is also requesting we order a paracentesis. She is full and feels she needs it. Please advise

## 2016-05-09 ENCOUNTER — Ambulatory Visit
Admission: RE | Admit: 2016-05-09 | Discharge: 2016-05-09 | Disposition: A | Discharge status: Home / Self Care | Payer: 59 | Source: Ambulatory Visit | Attending: Internal Medicine | Admitting: Internal Medicine

## 2016-05-09 DIAGNOSIS — R188 Other ascites: Secondary | ICD-10-CM | POA: Diagnosis not present

## 2016-05-09 NOTE — Procedures (Signed)
US guided paracentesis.  Removed 3.5 liters of yellow fluid.  Minimal blood loss and no immediate complication.

## 2016-05-10 ENCOUNTER — Inpatient Hospital Stay: Payer: 59

## 2016-05-10 ENCOUNTER — Inpatient Hospital Stay (HOSPITAL_BASED_OUTPATIENT_CLINIC_OR_DEPARTMENT_OTHER): Payer: 59 | Admitting: Internal Medicine

## 2016-05-10 VITALS — BP 100/56 | HR 76 | Temp 95.6°F | Wt 175.0 lb

## 2016-05-10 DIAGNOSIS — I10 Essential (primary) hypertension: Secondary | ICD-10-CM

## 2016-05-10 DIAGNOSIS — E875 Hyperkalemia: Secondary | ICD-10-CM

## 2016-05-10 DIAGNOSIS — C778 Secondary and unspecified malignant neoplasm of lymph nodes of multiple regions: Secondary | ICD-10-CM

## 2016-05-10 DIAGNOSIS — Z79899 Other long term (current) drug therapy: Secondary | ICD-10-CM

## 2016-05-10 DIAGNOSIS — R944 Abnormal results of kidney function studies: Secondary | ICD-10-CM | POA: Diagnosis not present

## 2016-05-10 DIAGNOSIS — N63 Unspecified lump in unspecified breast: Secondary | ICD-10-CM

## 2016-05-10 DIAGNOSIS — M7989 Other specified soft tissue disorders: Secondary | ICD-10-CM

## 2016-05-10 DIAGNOSIS — N179 Acute kidney failure, unspecified: Secondary | ICD-10-CM

## 2016-05-10 DIAGNOSIS — C221 Intrahepatic bile duct carcinoma: Secondary | ICD-10-CM | POA: Diagnosis not present

## 2016-05-10 DIAGNOSIS — R188 Other ascites: Secondary | ICD-10-CM | POA: Diagnosis not present

## 2016-05-10 DIAGNOSIS — E119 Type 2 diabetes mellitus without complications: Secondary | ICD-10-CM

## 2016-05-10 DIAGNOSIS — K7460 Unspecified cirrhosis of liver: Secondary | ICD-10-CM

## 2016-05-10 DIAGNOSIS — Z803 Family history of malignant neoplasm of breast: Secondary | ICD-10-CM

## 2016-05-10 NOTE — Progress Notes (Signed)
[place] NOTE  Patient Care Team: Crecencio Mc, MD as PCP - General (Internal Medicine) Robert Bellow, MD as Consulting Physician (General Surgery) Pollyann Glen, RN as [place] Management Kem Parkinson, [facility] as [place] Management (Pharmacist)  CHIEF COMPLAINTS/PURPOSE OF CONSULTATION:   Oncology History   # July-AUG [id number] x 10 cm lesion in the liver; MRI- enhancing/ left portal vein ;PET- uptake limited to liver. Ca 125- 1500; APF- N; Ca- 19-9-N.   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 Patient is having paracentesis up in 3-4 month. Last paracentesis was approximately 2 days ago.  On interval as patient was noted to have a bump in her creatinine 1.3; and potassium of 5.8. Needed to hold Aldactone; also received Kayexalate.  Patient's abdominal distention improved post paracentesis. Patient denies any worse and shortness of breath. She continues to be on diuretics; held spironolactone because of hyperkalemia. Mild to moderate swelling in legs.  No nausea no vomiting. No pain. No shortness of breath. She continues to work part-time. Still not interested in Pleurx catheter.   ROS: A complete 10 point review of system is done which is negative except mentioned above in history of present illness  MEDICAL HISTORY:  Past Medical History:  Diagnosis Date  . Ascites 01/09/2006   Ascites -  3L drawn off on 01/04/2016  . Breast lump in female 2014  . Diabetes mellitus without complication ([place])   . Elevated blood pressure reading   . Hypertension   . Liver mass 01/10/2016   Per patient; bloating.  Mass found via [place], CT, and MRI.     SURGICAL HISTORY: Past Surgical History:  Procedure Laterality Date  . BREAST BIOPSY  1996  . BREAST BIOPSY  2014   Calcifications - [place]  . BREAST LUMPECTOMY    . SHOULDER SURGERY  2011   right , [place]     SOCIAL HISTORY: no smoking/ alcohol.Gillermina Hu.. Self..  Social History   Social History  . Marital status: Divorced    Spouse name: N/A  . Number of children: N/A  . Years of education: N/A   Occupational History  . Not on file.   Social History Main Topics  . Smoking status: Never Smoker  . Smokeless tobacco: Never Used  . Alcohol use No  . Drug use: No  . Sexual activity: Not on file   Other Topics Concern  . Not on file   Social History Narrative  . No narrative on file    FAMILY HISTORY:  Family History  Problem Relation Age of Onset  . Cancer Mother 70    Breast Cancer  . Breast cancer Mother   . Asthma Father   . Diabetes Father     ALLERGIES:  is allergic to adhesive [tape].  MEDICATIONS:  Current Outpatient Prescriptions  Medication Sig Dispense Refill  .  BAYER CONTOUR NEXT TEST test strip CHECK SUGAR DAILY 100 each 3  . fluticasone (FLONASE) 50 MCG/ACT nasal spray Place 2 sprays into both nostrils daily. (Patient taking differently: Place 2 sprays into both nostrils daily as needed. ) 16 g 6  . furosemide (LASIX) 20 MG tablet Take 2 pills every day. 60 tablet 3  . Lancets (ONETOUCH ULTRASOFT) lancets Use one time daily to scheck blood sugars   250.00 100 each 3  . cholecalciferol (VITAMIN D) 1000 UNITS tablet Take 1,000 Units by mouth daily. Reported on 12/01/2015    . lisinopril (PRINIVIL,ZESTRIL) 10 MG tablet TAKE 1 TABLET (10 MG TOTAL) BY MOUTH DAILY. (Patient not taking: Reported on 05/10/2016)  90 tablet 1  . potassium chloride SA (K-DUR,KLOR-CON) 20 MEQ tablet Take 1 tablet (20 mEq total) by mouth 2 (two) times daily. (Patient not taking: Reported on 05/10/2016) 60 tablet 3  . sodium polystyrene (KAYEXALATE) powder 15 ml every 4-6 hours; until 2 loose stools. (Patient not taking: Reported on 05/10/2016) 454 g 0  . spironolactone (ALDACTONE) 50 MG tablet Take 1 tablet (50 mg total) by mouth daily. (Patient not taking: Reported on 05/10/2016) 30 tablet 3   No current facility-administered medications for this visit.       Marland Kitchen  PHYSICAL EXAMINATION: ECOG PERFORMANCE STATUS: 1 - Symptomatic but completely ambulatory  Vitals:   05/10/16 1424  BP: (!) 100/56  Pulse: 76  Temp: (!) 95.6 F (35.3 C)   Filed Weights   05/10/16 1424  Weight: 175 lb (79.4 kg)    GENERAL: Well-nourished well-developed; Alert, no distress and comfortable.   She is accompanied by a friend. She is walking herself. EYES: no pallor or icterus OROPHARYNX: no thrush or ulceration; good dentition  NECK: supple, no masses felt LYMPH:  no palpable lymphadenopathy in the cervical, axillary or inguinal regions LUNGS: clear to auscultation and  No wheeze or crackles HEART/CVS: regular rate & rhythm and no murmurs; No lower extremity edema ABDOMEN: abdomen soft, non-tender and normal bowel sounds; positive for distention. Positive for hepatomegaly and splenomegaly. Positive for ascites. Musculoskeletal:no cyanosis of digits and no clubbing  PSYCH: alert & oriented x 3 with fluent speech NEURO: no focal motor/sensory deficits SKIN:  no rashes or significant lesions  LABORATORY DATA:  I have reviewed the data as listed Lab Results  Component Value Date   WBC 4.4 05/08/2016   HGB 12.4 05/08/2016   HCT 36.7 05/08/2016   MCV 87.4 05/08/2016   PLT 162 05/08/2016    Recent Labs  12/15/15 1159  03/31/16 1246 04/19/16 1240 04/26/16 1304 05/02/16 1443 05/05/16 1145 05/08/16 1313  NA  --   < > 135 133*  130* 133*  --  134*  K  --   < > 4.9 5.2* 5.7* 5.8* 4.0 4.4  CL  --   < > 101 100* 96* 99*  --  97*  CO2  --   < > 26 24 27 28   --  28  GLUCOSE  --   < > 153* 141* 122* 142*  --  126*  BUN  --   < > 17 68* 56* 40*  --  44*  CREATININE  --   < > 0.86 1.37* 1.13* 1.15*  --  1.31*  CALCIUM  --   < > 8.5* 8.4* 8.5* 8.6*  --  8.4*  GFRNONAA  --   < > >60 38* 48* 47*  --  40*  GFRAA  --   < > >  60 44* 55* 54*  --  46*  PROT 6.3  < > 6.5 6.1*  --   --   --  5.9*  ALBUMIN 3.3*  < > 2.8* 2.5*  --   --   --  2.5*  AST 26  < > 41 40  --   --   --  40  ALT 10  < > 16 20  --   --   --  19  ALKPHOS 81  < > 106 106  --   --   --  101  BILITOT 1.5*  < > 1.6* 2.2*  --   --   --  2.5*  BILIDIR 0.5*  --   --   --   --   --   --   --   < > = values in this interval not displayed.  RADIOGRAPHIC STUDIES: I have personally reviewed the radiological images as listed and agreed with the findings in the report. US Paracentesis  Result Date: 05/09/2016 INDICATION: History of cholangiocarcinoma and recurrent ascites. EXAM: ULTRASOUND GUIDED PARACENTESIS MEDICATIONS: None. COMPLICATIONS: None immediate. PROCEDURE: Informed written consent was obtained from the patient after a discussion of the risks, benefits and alternatives to treatment. A timeout was performed prior to the initiation of the procedure. Initial ultrasound scanning demonstrates a large amount of ascites within the left lower abdominal quadrant. The left lower abdomen was prepped and draped in the usual sterile fashion. 1% lidocaine was used for local anesthesia. Following this, a 6 Fr Safe-T-Centesis catheter was introduced. An ultrasound image was saved for documentation purposes. The paracentesis was performed. The catheter was removed and a dressing was applied. The patient tolerated the procedure well without immediate post procedural complication. FINDINGS: A total of approximately 3.5 L of yellow fluid was removed. IMPRESSION: Successful  ultrasound-guided paracentesis yielding 3.5 liters of peritoneal fluid. Electronically Signed   By: Markus Daft M.D.   On: 05/09/2016 14:09   US Paracentesis  Result Date: 04/17/2016 INDICATION: Ascites EXAM: ULTRASOUND GUIDED RIGHT LOWER QUADRANT PARACENTESIS MEDICATIONS: None. COMPLICATIONS: None immediate. PROCEDURE: Informed written consent was obtained from the patient after a discussion of the risks, benefits and alternatives to treatment. A timeout was performed prior to the initiation of the procedure. Initial ultrasound scanning demonstrates a large amount of ascites within the right lower abdominal quadrant. The right lower abdomen was prepped and draped in the usual sterile fashion. 1% lidocaine with epinephrine was used for local anesthesia. Following this, a Safe-T-Centesis catheter was introduced. An ultrasound image was saved for documentation purposes. The paracentesis was performed. The catheter was removed and a dressing was applied. The patient tolerated the procedure well without immediate post procedural complication. FINDINGS: A total of approximately 4 L of clear yellow fluid was removed. IMPRESSION: Successful ultrasound-guided paracentesis yielding 4 liters of peritoneal fluid. Electronically Signed   By: Marybelle Killings M.D.   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 Status post Y 90 [oct 30th 2017]. Patient awaiting MRI of the liver at [facility] end of December 2017. Based on the response- question repeat Y 90 versus chemotherapy versus surveillance. Patient will also meet with Dr. Leamon Arnt post liver MRI.   # I had a long discussion the patient/daughter- the incurable nature of the disease in general the left expectancy is  anywhere between 8-12 months on chemotherapy. We'll continue to hold chemotherapy for now; await about MRI of the  liver/recommendations at [facility]  # Ascites/large volume needing frequent paracentesis [upper 3-4 month]-likely secondary to decompensated liver cirrhosis. Cytology negative multiple times. Patient continues to be reluctant with Pleurx catheter. She wants to continue as needed paracentesis.   # Acute kidney injury/hyperkalemia- creatinine 1.3/ potassium 5.8 currently improved. Discussed this is likely secondary to fluid shifts because of large volume paracentesis. For now continue Lasix. Hold Aldactone potassium.  # follow up in Jan 2nd week/cbc/cmp  # disability parking- this is reasonable as patient mobility is limited because of underlying disease.  # 40 minutes face-to-face with the patient discussing the above plan of care; more than 50% of time spent on prognosis/ natural history; counseling and coordination.     Cammie Sickle, MD 05/11/2016 2:49 PM

## 2016-05-10 NOTE — Progress Notes (Signed)
Patient here today for follow up Cholangiocarcinoma of liver.  Would like to talk with nutritionist about potassium intake.

## 2016-05-10 NOTE — Assessment & Plan Note (Addendum)
Likely cholangiocarcinoma based on liver biopsy at [facility] ; 10 x 10 cm enhancing Mass in the liver on the CT scan-. Status post Y 90 [oct 30th 2017]. Patient awaiting MRI of the liver at [facility] end of December 2017. Based on the response- question repeat Y 90 versus chemotherapy versus surveillance. Patient will also meet with Dr. Leamon Arnt post liver MRI.   # I had a long discussion the patient/daughter- the incurable nature of the disease in general the left expectancy is anywhere between 8-12 months on chemotherapy. We'll continue to hold chemotherapy for now; await about MRI of the liver/recommendations at [facility]  # Ascites/large volume needing frequent paracentesis [upper 3-4 month]-likely secondary to decompensated liver cirrhosis. Cytology negative multiple times. Patient continues to be reluctant with Pleurx catheter. She wants to continue as needed paracentesis.   # Acute kidney injury/hyperkalemia- creatinine 1.3/ potassium 5.8 currently improved. Discussed this is likely secondary to fluid shifts because of large volume paracentesis. For now continue Lasix. Hold Aldactone potassium.  # follow up in Jan 2nd week/cbc/cmp  # disability parking- this is reasonable as patient mobility is limited because of underlying disease.  # 40 minutes face-to-face with the patient discussing the above plan of care; more than 50% of time spent on prognosis/ natural history; counseling and coordination.

## 2016-05-11 ENCOUNTER — Telehealth: Payer: Self-pay

## 2016-05-11 ENCOUNTER — Telehealth: Payer: Self-pay | Admitting: *Deleted

## 2016-05-11 LAB — CYTOLOGY - NON PAP

## 2016-05-11 NOTE — Telephone Encounter (Signed)
Asking to have Joli call her back regarding questions she has regarding labels.

## 2016-05-11 NOTE — Telephone Encounter (Signed)
  Patient called clinic with questions regarding food labels and Na and K concerns of certain foods.  Returned patient phone call and answered questions.  Patient appreciative of call.  Zeph Riebel B. Zenia Resides, [place], [place] (pager)

## 2016-05-22 ENCOUNTER — Other Ambulatory Visit: Payer: Self-pay | Admitting: Internal Medicine

## 2016-05-23 ENCOUNTER — Other Ambulatory Visit: Payer: Self-pay | Admitting: Internal Medicine

## 2016-05-23 ENCOUNTER — Telehealth: Payer: Self-pay | Admitting: *Deleted

## 2016-05-23 ENCOUNTER — Encounter: Payer: Self-pay | Admitting: *Deleted

## 2016-05-23 DIAGNOSIS — R188 Other ascites: Secondary | ICD-10-CM

## 2016-05-23 NOTE — Telephone Encounter (Signed)
Dr B has agreed, order has been placed in epic, order faxed.

## 2016-05-23 NOTE — Telephone Encounter (Signed)
Patient called to report that she feels she needs a paracentesis procedure. Her last para was 05/09/2016. She called radiology for availability they can perform procedure on 05/25/2016 if MD agrees and enters order. Please contact patient at her work number with plan.

## 2016-05-23 NOTE — Telephone Encounter (Signed)
Left pt vm to return our call and send mychart message notifying her of order

## 2016-05-24 NOTE — Discharge Instructions (Signed)
Paracentesis, Care After °Introduction °Refer to this sheet in the next few weeks. These instructions provide you with information about caring for yourself after your procedure. Your health care provider may also give you more specific instructions. Your treatment has been planned according to current medical practices, but problems sometimes occur. Call your health care provider if you have any problems or questions after your procedure. °What can I expect after the procedure? °After your procedure, it is common to have a small amount of clear fluid coming from the puncture site. °Follow these instructions at home: °· Return to your normal activities as told by your health care provider. Ask your health care provider what activities are safe for you. °· Take over-the-counter and prescription medicines only as told by your health care provider. °· Do not take baths, swim, or use a hot tub until your health care provider approves. °· Follow instructions from your health care provider about: °¨ How to take care of your puncture site. °¨ When and how you should change your bandage (dressing). °¨ When you should remove your dressing. °· Check your puncture area every day signs of infection. Watch for: °¨ Redness, swelling, or pain. °¨ Fluid, blood, or pus. °· Keep all follow-up visits as told by your health care provider. This is important. °Contact a health care provider if: °· You have redness, swelling, or pain at your puncture site. °· You start to have more clear fluid coming from your puncture site. °· You have blood or pus coming from your puncture site. °· You have chills. °· You have a fever. °Get help right away if: °· You develop chest pain or shortness of breath. °· You develop increasing pain, discomfort, or swelling in your abdomen. °· You feel dizzy or light-headed or you pass out. °This information is not intended to replace advice given to you by your health care provider. Make sure you discuss any  questions you have with your health care provider. °Document Released: 10/06/2014 Document Revised: 10/28/2015 Document Reviewed: 08/04/2014 °© 2017 Elsevier ° °

## 2016-05-25 ENCOUNTER — Ambulatory Visit
Admission: RE | Admit: 2016-05-25 | Discharge: 2016-05-25 | Disposition: A | Discharge status: Home / Self Care | Payer: 59 | Source: Ambulatory Visit | Attending: Internal Medicine | Admitting: Internal Medicine

## 2016-05-25 DIAGNOSIS — R188 Other ascites: Secondary | ICD-10-CM | POA: Insufficient documentation

## 2016-05-25 NOTE — Procedures (Signed)
Successful US guided paracentesis.  Removed 3.5 liters of milky yellow fluid without complication.   See full report in IMAGING.

## 2016-05-30 DIAGNOSIS — K766 Portal hypertension: Secondary | ICD-10-CM | POA: Diagnosis not present

## 2016-05-30 DIAGNOSIS — J90 Pleural effusion, not elsewhere classified: Secondary | ICD-10-CM | POA: Diagnosis not present

## 2016-05-30 DIAGNOSIS — R188 Other ascites: Secondary | ICD-10-CM | POA: Diagnosis not present

## 2016-05-30 DIAGNOSIS — C221 Intrahepatic bile duct carcinoma: Secondary | ICD-10-CM | POA: Diagnosis not present

## 2016-05-30 DIAGNOSIS — C249 Malignant neoplasm of biliary tract, unspecified: Secondary | ICD-10-CM | POA: Diagnosis not present

## 2016-06-01 ENCOUNTER — Inpatient Hospital Stay: Payer: 59

## 2016-06-01 ENCOUNTER — Telehealth: Payer: Self-pay | Admitting: *Deleted

## 2016-06-01 DIAGNOSIS — C220 Liver cell carcinoma: Secondary | ICD-10-CM

## 2016-06-01 DIAGNOSIS — N63 Unspecified lump in unspecified breast: Secondary | ICD-10-CM | POA: Diagnosis not present

## 2016-06-01 DIAGNOSIS — N179 Acute kidney failure, unspecified: Secondary | ICD-10-CM | POA: Diagnosis not present

## 2016-06-01 DIAGNOSIS — C221 Intrahepatic bile duct carcinoma: Secondary | ICD-10-CM | POA: Diagnosis not present

## 2016-06-01 DIAGNOSIS — M7989 Other specified soft tissue disorders: Secondary | ICD-10-CM | POA: Diagnosis not present

## 2016-06-01 DIAGNOSIS — C778 Secondary and unspecified malignant neoplasm of lymph nodes of multiple regions: Secondary | ICD-10-CM | POA: Diagnosis not present

## 2016-06-01 DIAGNOSIS — R188 Other ascites: Secondary | ICD-10-CM

## 2016-06-01 DIAGNOSIS — K7460 Unspecified cirrhosis of liver: Secondary | ICD-10-CM | POA: Diagnosis not present

## 2016-06-01 DIAGNOSIS — E875 Hyperkalemia: Secondary | ICD-10-CM | POA: Diagnosis not present

## 2016-06-01 DIAGNOSIS — R944 Abnormal results of kidney function studies: Secondary | ICD-10-CM | POA: Diagnosis not present

## 2016-06-01 LAB — CBC WITH DIFFERENTIAL/PLATELET
Basophils Absolute: 0 10*3/uL (ref 0–0.1)
Basophils Relative: 0 %
Eosinophils Absolute: 0 10*3/uL (ref 0–0.7)
Eosinophils Relative: 1 %
HCT: 36.7 % (ref 35.0–47.0)
Hemoglobin: 12.3 g/dL (ref 12.0–16.0)
Lymphocytes Relative: 15 %
Lymphs Abs: 1 10*3/uL (ref 1.0–3.6)
MCH: 29.8 pg (ref 26.0–34.0)
MCHC: 33.6 g/dL (ref 32.0–36.0)
MCV: 88.8 fL (ref 80.0–100.0)
Monocytes Absolute: 0.9 10*3/uL (ref 0.2–0.9)
Monocytes Relative: 14 %
Neutro Abs: 4.6 10*3/uL (ref 1.4–6.5)
Neutrophils Relative %: 70 %
Platelets: 182 10*3/uL (ref 150–440)
RBC: 4.13 MIL/uL (ref 3.80–5.20)
RDW: 17.3 % — ABNORMAL HIGH (ref 11.5–14.5)
WBC: 6.6 10*3/uL (ref 3.6–11.0)

## 2016-06-01 LAB — BASIC METABOLIC PANEL
Anion gap: 7 (ref 5–15)
BUN: 41 mg/dL — ABNORMAL HIGH (ref 6–20)
CO2: 26 mmol/L (ref 22–32)
Calcium: 8.4 mg/dL — ABNORMAL LOW (ref 8.9–10.3)
Chloride: 96 mmol/L — ABNORMAL LOW (ref 101–111)
Creatinine, Ser: 1.27 mg/dL — ABNORMAL HIGH (ref 0.44–1.00)
GFR calc Af Amer: 48 mL/min — ABNORMAL LOW (ref >60)
GFR calc non Af Amer: 41 mL/min — ABNORMAL LOW (ref >60)
Glucose, Bld: 105 mg/dL — ABNORMAL HIGH (ref 65–99)
Potassium: 4.7 mmol/L (ref 3.5–5.1)
Sodium: 129 mmol/L — ABNORMAL LOW (ref 135–145)

## 2016-06-01 LAB — PROTIME-INR
INR: 1.15
Prothrombin Time: 14.8 seconds (ref 11.4–15.2)

## 2016-06-01 LAB — APTT: aPTT: 39 seconds — ABNORMAL HIGH (ref 24–36)

## 2016-06-01 NOTE — Discharge Instructions (Signed)
Paracentesis, Care After °Introduction °Refer to this sheet in the next few weeks. These instructions provide you with information about caring for yourself after your procedure. Your health care provider may also give you more specific instructions. Your treatment has been planned according to current medical practices, but problems sometimes occur. Call your health care provider if you have any problems or questions after your procedure. °What can I expect after the procedure? °After your procedure, it is common to have a small amount of clear fluid coming from the puncture site. °Follow these instructions at home: °· Return to your normal activities as told by your health care provider. Ask your health care provider what activities are safe for you. °· Take over-the-counter and prescription medicines only as told by your health care provider. °· Do not take baths, swim, or use a hot tub until your health care provider approves. °· Follow instructions from your health care provider about: °¨ How to take care of your puncture site. °¨ When and how you should change your bandage (dressing). °¨ When you should remove your dressing. °· Check your puncture area every day signs of infection. Watch for: °¨ Redness, swelling, or pain. °¨ Fluid, blood, or pus. °· Keep all follow-up visits as told by your health care provider. This is important. °Contact a health care provider if: °· You have redness, swelling, or pain at your puncture site. °· You start to have more clear fluid coming from your puncture site. °· You have blood or pus coming from your puncture site. °· You have chills. °· You have a fever. °Get help right away if: °· You develop chest pain or shortness of breath. °· You develop increasing pain, discomfort, or swelling in your abdomen. °· You feel dizzy or light-headed or you pass out. °This information is not intended to replace advice given to you by your health care provider. Make sure you discuss any  questions you have with your health care provider. °Document Released: 10/06/2014 Document Revised: 10/28/2015 Document Reviewed: 08/04/2014 °© 2017 Elsevier ° °

## 2016-06-01 NOTE — Telephone Encounter (Signed)
Spoke with Dr. Zenia Resides - md would like pt to come in today for labs (cbc, metb, pt/ptt). Pt notified that labs are needed. Will schedule [place] para tomorrow. Orders entered.

## 2016-06-01 NOTE — Telephone Encounter (Signed)
Requesting we order a paracentesis for her as her abd has become quite tight again. She would like to have it done tomorrow if at all possible. Please advise

## 2016-06-02 ENCOUNTER — Ambulatory Visit
Admission: RE | Admit: 2016-06-02 | Discharge: 2016-06-02 | Disposition: A | Discharge status: Home / Self Care | Payer: 59 | Source: Ambulatory Visit | Attending: Hematology and Oncology | Admitting: Hematology and Oncology

## 2016-06-02 DIAGNOSIS — R188 Other ascites: Secondary | ICD-10-CM | POA: Diagnosis not present

## 2016-06-02 DIAGNOSIS — C220 Liver cell carcinoma: Secondary | ICD-10-CM | POA: Insufficient documentation

## 2016-06-06 LAB — CYTOLOGY - NON PAP

## 2016-06-14 ENCOUNTER — Inpatient Hospital Stay: Payer: 59

## 2016-06-14 ENCOUNTER — Ambulatory Visit
Admission: RE | Admit: 2016-06-14 | Discharge: 2016-06-14 | Disposition: A | Discharge status: Home / Self Care | Payer: 59 | Source: Ambulatory Visit | Attending: Internal Medicine | Admitting: Internal Medicine

## 2016-06-14 ENCOUNTER — Inpatient Hospital Stay: Payer: 59 | Attending: Internal Medicine | Admitting: Internal Medicine

## 2016-06-14 ENCOUNTER — Other Ambulatory Visit: Payer: Self-pay | Admitting: *Deleted

## 2016-06-14 VITALS — BP 103/65 | HR 77 | Temp 96.3°F | Wt 184.4 lb

## 2016-06-14 DIAGNOSIS — C221 Intrahepatic bile duct carcinoma: Secondary | ICD-10-CM | POA: Insufficient documentation

## 2016-06-14 DIAGNOSIS — R188 Other ascites: Secondary | ICD-10-CM | POA: Diagnosis not present

## 2016-06-14 DIAGNOSIS — N183 Chronic kidney disease, stage 3 (moderate): Secondary | ICD-10-CM | POA: Insufficient documentation

## 2016-06-14 DIAGNOSIS — J90 Pleural effusion, not elsewhere classified: Secondary | ICD-10-CM | POA: Insufficient documentation

## 2016-06-14 DIAGNOSIS — C220 Liver cell carcinoma: Secondary | ICD-10-CM

## 2016-06-14 DIAGNOSIS — I129 Hypertensive chronic kidney disease with stage 1 through stage 4 chronic kidney disease, or unspecified chronic kidney disease: Secondary | ICD-10-CM | POA: Diagnosis not present

## 2016-06-14 DIAGNOSIS — Z803 Family history of malignant neoplasm of breast: Secondary | ICD-10-CM | POA: Insufficient documentation

## 2016-06-14 DIAGNOSIS — Z79899 Other long term (current) drug therapy: Secondary | ICD-10-CM | POA: Insufficient documentation

## 2016-06-14 DIAGNOSIS — R948 Abnormal results of function studies of other organs and systems: Secondary | ICD-10-CM | POA: Insufficient documentation

## 2016-06-14 DIAGNOSIS — J9811 Atelectasis: Secondary | ICD-10-CM | POA: Insufficient documentation

## 2016-06-14 DIAGNOSIS — R918 Other nonspecific abnormal finding of lung field: Secondary | ICD-10-CM | POA: Diagnosis not present

## 2016-06-14 DIAGNOSIS — E119 Type 2 diabetes mellitus without complications: Secondary | ICD-10-CM | POA: Insufficient documentation

## 2016-06-14 DIAGNOSIS — R140 Abdominal distension (gaseous): Secondary | ICD-10-CM | POA: Insufficient documentation

## 2016-06-14 LAB — COMPREHENSIVE METABOLIC PANEL
ALK PHOS: 108 U/L (ref 38–126)
ALT: 20 U/L (ref 14–54)
AST: 59 U/L — ABNORMAL HIGH (ref 15–41)
Albumin: 2.4 g/dL — ABNORMAL LOW (ref 3.5–5.0)
Anion gap: 9 (ref 5–15)
BUN: 36 mg/dL — AB (ref 6–20)
CALCIUM: 8.3 mg/dL — AB (ref 8.9–10.3)
CO2: 25 mmol/L (ref 22–32)
CREATININE: 1.26 mg/dL — AB (ref 0.44–1.00)
Chloride: 95 mmol/L — ABNORMAL LOW (ref 101–111)
GFR calc Af Amer: 48 mL/min — ABNORMAL LOW (ref >60)
GFR, EST NON AFRICAN AMERICAN: 42 mL/min — AB (ref >60)
Glucose, Bld: 109 mg/dL — ABNORMAL HIGH (ref 65–99)
Potassium: 5 mmol/L (ref 3.5–5.1)
Sodium: 129 mmol/L — ABNORMAL LOW (ref 135–145)
Total Bilirubin: 2.5 mg/dL — ABNORMAL HIGH (ref 0.3–1.2)
Total Protein: 5.7 g/dL — ABNORMAL LOW (ref 6.5–8.1)

## 2016-06-14 LAB — CBC WITH DIFFERENTIAL/PLATELET
Basophils Absolute: 0 10*3/uL (ref 0–0.1)
Basophils Relative: 1 %
Eosinophils Absolute: 0 10*3/uL (ref 0–0.7)
Eosinophils Relative: 1 %
HCT: 36 % (ref 35.0–47.0)
HEMOGLOBIN: 12.2 g/dL (ref 12.0–16.0)
LYMPHS ABS: 0.9 10*3/uL — AB (ref 1.0–3.6)
LYMPHS PCT: 15 %
MCH: 30.3 pg (ref 26.0–34.0)
MCHC: 34 g/dL (ref 32.0–36.0)
MCV: 89.1 fL (ref 80.0–100.0)
Monocytes Absolute: 1 10*3/uL — ABNORMAL HIGH (ref 0.2–0.9)
Monocytes Relative: 16 %
NEUTROS ABS: 4.1 10*3/uL (ref 1.4–6.5)
NEUTROS PCT: 67 %
Platelets: 168 10*3/uL (ref 150–440)
RBC: 4.04 MIL/uL (ref 3.80–5.20)
RDW: 17 % — ABNORMAL HIGH (ref 11.5–14.5)
WBC: 6.1 10*3/uL (ref 3.6–11.0)

## 2016-06-14 LAB — APTT: aPTT: 43 seconds — ABNORMAL HIGH (ref 24–36)

## 2016-06-14 LAB — PROTIME-INR
INR: 1.22
PROTHROMBIN TIME: 15.5 s — AB (ref 11.4–15.2)

## 2016-06-14 NOTE — Assessment & Plan Note (Addendum)
Likely cholangiocarcinoma based on liver biopsy at [facility] ; 10 x 10 cm enhancing Mass in the liver on the CT scan-. Status post Y 90 [oct 30th 2017]. December 2017 MRI at [facility]-partial response. Awaiting repeat MRI at 3 months/April 2018.   # Ascites/large volume needing frequent paracentesis [approximately 2-3 per month]-likely secondary to decompensated liver cirrhosis. Cytology negative multiple times. Proceed with paracentesis  # Chronic kidney disease stage III-creatinine 1.3 stable. currently improved. Discussed this is likely secondary to fluid shifts because of large volume paracentesis. For now continue Lasix.  # Elevated liver function bilirubin 2.5 likely secondary to underlying malignancy cirrhosis.   # Decreased breath sounds left lower base- question atelectasis versus pleural effusion. Check a chest x-ray.  # follow up in 6 week/ labs- PT/PTT. Patient will call us for paracentesis sooner if needed.

## 2016-06-14 NOTE — Progress Notes (Signed)
[place] NOTE  Patient Care Team: Crecencio Mc, MD as PCP - General (Internal Medicine) Robert Bellow, MD as Consulting Physician (General Surgery) Pollyann Glen, RN as [place] Management Kem Parkinson, [facility] as [place] Management (Pharmacist)  CHIEF COMPLAINTS/PURPOSE OF CONSULTATION:   Oncology History   # July-AUG [phone] x 10 cm lesion in the liver; MRI- enhancing/ left portal vein ;PET- uptake limited to liver. Ca 125- 1500; APF- N; Ca- 19-9-N.   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  female with Cholangiocarcinoma intrahepatic status post y90  Approximately 3 months ago. Patient is having paracentesis 2-3 months ago.   In the interim patient had an MRI of the liver at [facility]; she also had evaluated by Dr. Leamon Arnt.  Patient notes to have worsened with normal distention. Continues to have swelling in the legs. Denies any worsened shortness of breath or cough. Denies any fevers. She continues to be on diuretics.   ROS: A complete 10 point review of system is done which is negative except mentioned above in history of present illness  MEDICAL HISTORY:  Past Medical History:  Diagnosis Date  . Ascites 01/09/2006   Ascites - 3L drawn off on 01/04/2016  . Breast lump in female 2014  . Diabetes mellitus without complication ([place])   . Elevated blood pressure reading   . Hypertension   . Liver mass 01/10/2016   Per patient; bloating.  Mass found via [place], CT, and MRI.      SURGICAL HISTORY: Past Surgical History:  Procedure Laterality Date  . BREAST BIOPSY  1996  . BREAST BIOPSY  2014   Calcifications - Byrnett  . BREAST LUMPECTOMY    . SHOULDER SURGERY  2011   right , [place]     SOCIAL HISTORY: no smoking/ alcohol.Gillermina Hu.. Self..  Social History   Social History  . Marital status: Divorced    Spouse name: N/A  . Number of children: N/A  . Years of education: N/A   Occupational History  . Not on file.   Social History Main Topics  . Smoking status: Never Smoker  . Smokeless tobacco: Never Used  . Alcohol use No  . Drug use: No  . Sexual activity: Not on file   Other Topics Concern  . Not on file   Social History Narrative  . No narrative on file    FAMILY HISTORY:  Family History  Problem Relation Age of Onset  . Cancer Mother 50    Breast Cancer  . Breast cancer Mother   . Asthma Father   . Diabetes Father     ALLERGIES:  is allergic to adhesive [tape].  MEDICATIONS:  Current Outpatient Prescriptions  Medication Sig Dispense Refill  . furosemide (LASIX) 20 MG tablet TAKE 2 TABLETS BY MOUTH DAILY 60 tablet 3  . Lancets (ONETOUCH ULTRASOFT) lancets Use one time daily to scheck blood sugars   250.00 100 each 3  .  BAYER CONTOUR NEXT TEST test strip CHECK SUGAR DAILY 100 each 3   No current facility-administered medications for this visit.       Marland Kitchen  PHYSICAL EXAMINATION: ECOG PERFORMANCE STATUS: 1 - Symptomatic but completely ambulatory  Vitals:   06/14/16 1529  BP: 103/65  Pulse: 77  Temp: (!) 96.3 F (35.7 C)   Filed Weights   06/14/16 1529  Weight: 184 lb 6 oz (83.6 kg)    GENERAL: Well-nourished well-developed; Alert, no distress and comfortable.  She is alone. She is walking herself. EYES: no pallor or icterus OROPHARYNX: no thrush or ulceration; good dentition  NECK: supple, no masses felt LYMPH:  no palpable lymphadenopathy in the cervical, axillary or inguinal regions LUNGS: Decreased breath  sounds to auscultation left lower base and  No wheeze or crackles HEART/CVS: regular rate & rhythm and no murmurs; No lower extremity edema ABDOMEN: abdomen soft, non-tender and normal bowel sounds; positive for distention. Positive for hepatomegaly and splenomegaly. Positive for ascites. Musculoskeletal:no cyanosis of digits and no clubbing  PSYCH: alert & oriented x 3 with fluent speech NEURO: no focal motor/sensory deficits SKIN:  no rashes or significant lesions  LABORATORY DATA:  I have reviewed the data as listed Lab Results  Component Value Date   WBC 6.1 06/14/2016   HGB 12.2 06/14/2016   HCT 36.0 06/14/2016   MCV 89.1 06/14/2016   PLT 168 06/14/2016    Recent Labs  12/15/15 1159  04/19/16 1240  05/08/16 1313 06/01/16 1420 06/14/16 1500  NA  --   < > 133*  < > 134* 129* 129*  K  --   < > 5.2*  < > 4.4 4.7 5.0  CL  --   < > 100*  < > 97* 96* 95*  CO2  --   < > 24  < > 28 26 25   GLUCOSE  --   < > 141*  < > 126* 105* 109*  BUN  --   < > 68*  < > 44* 41* 36*  CREATININE  --   < > 1.37*  < > 1.31* 1.27* 1.26*  CALCIUM  --   < > 8.4*  < > 8.4* 8.4* 8.3*  GFRNONAA  --   < > 38*  < > 40* 41* 42*  GFRAA  --   < > 44*  < > 46* 48* 48*  PROT 6.3  < > 6.1*  --  5.9*  --  5.7*  ALBUMIN 3.3*  < > 2.5*  --  2.5*  --  2.4*  AST 26  < > 40  --  40  --  59*  ALT 10  < > 20  --  19  --  20  ALKPHOS 81  < > 106  --  101  --  108  BILITOT 1.5*  < > 2.2*  --  2.5*  --  2.5*  BILIDIR 0.5*  --   --   --   --   --   --   < > = values in this interval not displayed.  RADIOGRAPHIC STUDIES: I have personally reviewed the radiological images as listed and agreed with the findings in the report. Dg Chest 2 View  Result Date: 06/14/2016 CLINICAL DATA:  Pt says dr doesn't think she is getting much oxygen to the left lung. Hx of liver tumor, radiation, diabetes, hypertension. Non smoker EXAM: CHEST - 2 VIEW COMPARISON:  PET-CT 01/10/2016 and previous FINDINGS: Small left pleural  effusion.  Consolidation/ atelectasis in the left lower lobe. Right lung clear. Heart size and mediastinal contours are within normal limits. No pneumothorax. Fixation hardware across the surgical neck of the right humerus. IMPRESSION: 1. Small left effusion with adjacent consolidation/atelectasis. Electronically Signed   By: Lucrezia Europe M.D.   On: 06/14/2016 17:02   US Paracentesis  Result Date: 06/15/2016 INDICATION: History of cholangiocarcinoma with recurrent symptomatic ascites. Please from ultrasound-guided paracentesis with a maximum the yield of 4 L EXAM: ULTRASOUND-GUIDED PARACENTESIS COMPARISON:  Ultrasound-guided paracentesis - 06/02/2016; 05/25/2016 MEDICATIONS: None. COMPLICATIONS: None immediate. TECHNIQUE: Informed written consent was obtained from the patient after a discussion of the risks, benefits and alternatives to treatment. A timeout was performed prior to the initiation of the procedure. Initial ultrasound scanning demonstrates a small to moderate amount of ascites within the right lower abdominal quadrant. The right lower abdomen was prepped and draped in the usual sterile fashion. 1% lidocaine with epinephrine was used for local anesthesia. An ultrasound image was saved for documentation purposed. An 8 Fr Safe-T-Centesis catheter was introduced. The paracentesis was performed. The catheter was removed and a dressing was applied. The patient tolerated the procedure well without immediate post procedural complication. FINDINGS: A total of approximately 3.7 liters of serous fluid was removed. IMPRESSION: Successful ultrasound-guided paracentesis yielding 3.7 liters of peritoneal fluid. Electronically Signed   By: Sandi Mariscal M.D.   On: 06/15/2016 16:44   US Paracentesis  Result Date: 06/02/2016 INDICATION: 71 year old female with hepatocellular cancer and malignant ascites. EXAM: ULTRASOUND GUIDED  PARACENTESIS MEDICATIONS: None. COMPLICATIONS: None immediate. PROCEDURE: Informed written consent  was obtained from the patient after a discussion of the risks, benefits and alternatives to treatment. A timeout was performed prior to the initiation of the procedure. Initial ultrasound scanning demonstrates a large amount of ascites within the right lower abdominal quadrant. The right lower abdomen was prepped and draped in the usual sterile fashion. 1% lidocaine with epinephrine was used for local anesthesia. Following this, a 6 Fr Safe-T-Centesis catheter was introduced. An ultrasound image was saved for documentation purposes. The paracentesis was performed. The catheter was removed and a dressing was applied. The patient tolerated the procedure well without immediate post procedural complication. FINDINGS: A total of approximately 3.3 L of yellow ascitic fluid was removed. IMPRESSION: Successful ultrasound-guided paracentesis yielding 3.3 liters of peritoneal fluid. Electronically Signed   By: Jacqulynn Cadet M.D.   On: 06/02/2016 17:05   US Paracentesis  Result Date: 05/25/2016 INDICATION: Cholangiocarcinoma and recurrent ascites. EXAM: ULTRASOUND GUIDEDPARACENTESIS MEDICATIONS: None. COMPLICATIONS: None immediate. PROCEDURE: Informed written consent was obtained from the patient after a discussion of the risks, benefits and alternatives to treatment. A timeout was performed prior to the initiation of the procedure. Initial ultrasound scanning demonstrates a large amount of ascites within the left lower abdominal quadrant. The right lower abdomen was prepped and draped in the usual sterile fashion. 1% lidocaine was used for local anesthesia. Following this, a 6 Fr Safe-T-Centesis catheter was introduced. An ultrasound image was saved for documentation purposes. The paracentesis was performed. The catheter was removed and a dressing was applied. The patient tolerated the procedure well without immediate post procedural complication. FINDINGS: A total of approximately 3.5 L of milky yellow fluid was  removed. IMPRESSION: Successful ultrasound-guided paracentesis yielding 3.5 liters of peritoneal fluid. Electronically Signed   By: Markus Daft M.D.   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 Status post Y 90 [oct 30th 2017]. December 2017 MRI at [facility]-partial response. Awaiting repeat MRI at 3 months/April 2018.   # Ascites/large volume needing frequent paracentesis [approximately 2-3 per month]-likely secondary to decompensated liver cirrhosis. Cytology negative multiple times. Proceed with paracentesis  # Chronic kidney disease stage III-creatinine 1.3 stable. currently improved. Discussed this is likely secondary to fluid shifts because of large volume paracentesis. For now continue Lasix.  # Elevated liver function bilirubin 2.5 likely secondary to underlying malignancy cirrhosis.   # Decreased breath sounds left lower base- question atelectasis versus pleural effusion. Check a chest x-ray.  # follow up in 6 week/ labs- PT/PTT. Patient will call us for paracentesis sooner if needed.    Cammie Sickle, MD 06/15/2016 5:16 PM

## 2016-06-14 NOTE — Progress Notes (Signed)
Patient here today for follow up.   

## 2016-06-15 ENCOUNTER — Ambulatory Visit
Admission: RE | Admit: 2016-06-15 | Discharge: 2016-06-15 | Disposition: A | Discharge status: Home / Self Care | Payer: 59 | Source: Ambulatory Visit | Attending: Internal Medicine | Admitting: Internal Medicine

## 2016-06-15 ENCOUNTER — Encounter: Payer: Self-pay | Admitting: *Deleted

## 2016-06-15 DIAGNOSIS — R188 Other ascites: Secondary | ICD-10-CM | POA: Diagnosis not present

## 2016-06-15 DIAGNOSIS — C221 Intrahepatic bile duct carcinoma: Secondary | ICD-10-CM | POA: Insufficient documentation

## 2016-06-15 NOTE — Procedures (Signed)
Successful US guided paracentesis yielding 3.7 L of serous ascitic fluid. EBL: None No immediate post procedural complications.   Ronny Bacon, MD Pager #: [phone]

## 2016-06-26 ENCOUNTER — Telehealth: Payer: Self-pay | Admitting: *Deleted

## 2016-06-26 ENCOUNTER — Other Ambulatory Visit: Payer: Self-pay | Admitting: *Deleted

## 2016-06-26 DIAGNOSIS — K7460 Unspecified cirrhosis of liver: Secondary | ICD-10-CM

## 2016-06-26 DIAGNOSIS — R188 Other ascites: Secondary | ICD-10-CM

## 2016-06-26 DIAGNOSIS — C220 Liver cell carcinoma: Secondary | ICD-10-CM

## 2016-06-26 NOTE — Telephone Encounter (Signed)
Called and reported that at her appt last week Dr Rogue Bussing told her he would put in orders for her paracentesis to be done when needed and she is wanting to have one done tomorrow or the next day. Please advise

## 2016-06-26 NOTE — Telephone Encounter (Signed)
Orders entered for u/s paracentesis. Max limit of 3.5 L. No labs needed per md. msg sent to c.ctr sch. Team to arrange for paracentesis apt.

## 2016-06-27 ENCOUNTER — Ambulatory Visit
Admission: RE | Admit: 2016-06-27 | Discharge: 2016-06-27 | Disposition: A | Discharge status: Home / Self Care | Payer: 59 | Source: Ambulatory Visit | Attending: Internal Medicine | Admitting: Internal Medicine

## 2016-06-27 DIAGNOSIS — R188 Other ascites: Secondary | ICD-10-CM

## 2016-06-27 DIAGNOSIS — R180 Malignant ascites: Secondary | ICD-10-CM | POA: Diagnosis not present

## 2016-06-27 DIAGNOSIS — C220 Liver cell carcinoma: Secondary | ICD-10-CM | POA: Diagnosis not present

## 2016-06-27 DIAGNOSIS — K7460 Unspecified cirrhosis of liver: Secondary | ICD-10-CM | POA: Insufficient documentation

## 2016-06-27 NOTE — Procedures (Signed)
RLQ paracentesis No comp/EBL 

## 2016-07-06 ENCOUNTER — Telehealth: Payer: Self-pay | Admitting: *Deleted

## 2016-07-06 DIAGNOSIS — R180 Malignant ascites: Secondary | ICD-10-CM

## 2016-07-06 DIAGNOSIS — C220 Liver cell carcinoma: Secondary | ICD-10-CM

## 2016-07-06 NOTE — Telephone Encounter (Signed)
Requesting an order be sent for her to have a paracentesis on Tuesday 2/6 so that Dr Anselm Pancoast can perform

## 2016-07-06 NOTE — Telephone Encounter (Signed)
Per v/o Dr. Rogue Bussing- proceed with u/s paracentesis today. Max fluid withdrawn 3.5 L of fluid. No need for fluid cytology labs. No labs prior to para per Dr. Rogue Bussing

## 2016-07-07 ENCOUNTER — Ambulatory Visit
Admission: RE | Admit: 2016-07-07 | Discharge: 2016-07-07 | Disposition: A | Discharge status: Home / Self Care | Payer: 59 | Source: Ambulatory Visit | Attending: Internal Medicine | Admitting: Internal Medicine

## 2016-07-07 DIAGNOSIS — C220 Liver cell carcinoma: Secondary | ICD-10-CM | POA: Diagnosis not present

## 2016-07-07 DIAGNOSIS — R180 Malignant ascites: Secondary | ICD-10-CM | POA: Diagnosis not present

## 2016-07-07 DIAGNOSIS — R188 Other ascites: Secondary | ICD-10-CM | POA: Diagnosis not present

## 2016-07-07 NOTE — Procedures (Signed)
Successful US guided paracentesis.  Removed 3.5 liters of milky/cloudy fluid.  No immediate complication.

## 2016-07-17 ENCOUNTER — Other Ambulatory Visit: Payer: Self-pay | Admitting: Internal Medicine

## 2016-07-17 ENCOUNTER — Ambulatory Visit
Admission: RE | Admit: 2016-07-17 | Discharge: 2016-07-17 | Disposition: A | Discharge status: Home / Self Care | Payer: 59 | Source: Ambulatory Visit | Attending: Internal Medicine | Admitting: Internal Medicine

## 2016-07-17 ENCOUNTER — Ambulatory Visit: Payer: 59

## 2016-07-17 ENCOUNTER — Telehealth: Payer: Self-pay | Admitting: *Deleted

## 2016-07-17 ENCOUNTER — Other Ambulatory Visit: Payer: Self-pay | Admitting: *Deleted

## 2016-07-17 ENCOUNTER — Other Ambulatory Visit: Payer: Self-pay | Admitting: Pharmacist

## 2016-07-17 DIAGNOSIS — C220 Liver cell carcinoma: Secondary | ICD-10-CM | POA: Insufficient documentation

## 2016-07-17 DIAGNOSIS — R188 Other ascites: Secondary | ICD-10-CM | POA: Diagnosis not present

## 2016-07-17 MED ORDER — ALBUMIN HUMAN 25 % IV SOLN
25.0000 g | Freq: Once | INTRAVENOUS | Status: DC
  Filled 2016-07-17: qty 100

## 2016-07-17 NOTE — Telephone Encounter (Signed)
Paracentesis Orders entered. md also entered orders for replacement albumin. md would like a met b prior to paracentesis. msg sent to scheduling to arrange apts.

## 2016-07-17 NOTE — Telephone Encounter (Signed)
Asking for an order to have a paracentesis. States she is very tight to the point she almost went to ER yesterday.

## 2016-07-17 NOTE — Telephone Encounter (Signed)
OK per Dr Rogue Bussing

## 2016-07-18 ENCOUNTER — Telehealth: Payer: Self-pay | Admitting: *Deleted

## 2016-07-18 NOTE — Telephone Encounter (Signed)
Requesting an order for weekly or every other week paracentesis. 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 If they had a weekly order, this would help them be prepared for her and not be double booked, the albumin can also be added to the weekly order if needed. Please advise

## 2016-07-19 NOTE — Telephone Encounter (Signed)
Per Dr Rogue Bussing, next time she needs procedure, will order weekly

## 2016-07-21 ENCOUNTER — Other Ambulatory Visit: Payer: Self-pay | Admitting: Internal Medicine

## 2016-07-21 ENCOUNTER — Other Ambulatory Visit: Payer: Self-pay | Admitting: *Deleted

## 2016-07-21 DIAGNOSIS — R188 Other ascites: Secondary | ICD-10-CM

## 2016-07-21 DIAGNOSIS — K7460 Unspecified cirrhosis of liver: Secondary | ICD-10-CM

## 2016-07-21 DIAGNOSIS — C221 Intrahepatic bile duct carcinoma: Secondary | ICD-10-CM

## 2016-07-21 NOTE — Progress Notes (Signed)
Standing weekly BMP order has been done.

## 2016-07-26 ENCOUNTER — Inpatient Hospital Stay: Payer: 59 | Attending: Internal Medicine | Admitting: Internal Medicine

## 2016-07-26 ENCOUNTER — Inpatient Hospital Stay: Payer: 59

## 2016-07-26 VITALS — BP 107/65 | HR 81 | Temp 96.4°F | Wt 189.5 lb

## 2016-07-26 DIAGNOSIS — I129 Hypertensive chronic kidney disease with stage 1 through stage 4 chronic kidney disease, or unspecified chronic kidney disease: Secondary | ICD-10-CM | POA: Diagnosis not present

## 2016-07-26 DIAGNOSIS — R188 Other ascites: Secondary | ICD-10-CM | POA: Diagnosis not present

## 2016-07-26 DIAGNOSIS — E119 Type 2 diabetes mellitus without complications: Secondary | ICD-10-CM | POA: Diagnosis not present

## 2016-07-26 DIAGNOSIS — M7989 Other specified soft tissue disorders: Secondary | ICD-10-CM | POA: Diagnosis not present

## 2016-07-26 DIAGNOSIS — N63 Unspecified lump in unspecified breast: Secondary | ICD-10-CM | POA: Insufficient documentation

## 2016-07-26 DIAGNOSIS — C221 Intrahepatic bile duct carcinoma: Secondary | ICD-10-CM | POA: Insufficient documentation

## 2016-07-26 DIAGNOSIS — R948 Abnormal results of function studies of other organs and systems: Secondary | ICD-10-CM | POA: Diagnosis not present

## 2016-07-26 DIAGNOSIS — R5383 Other fatigue: Secondary | ICD-10-CM | POA: Insufficient documentation

## 2016-07-26 DIAGNOSIS — R140 Abdominal distension (gaseous): Secondary | ICD-10-CM | POA: Insufficient documentation

## 2016-07-26 DIAGNOSIS — K7460 Unspecified cirrhosis of liver: Secondary | ICD-10-CM | POA: Insufficient documentation

## 2016-07-26 DIAGNOSIS — N183 Chronic kidney disease, stage 3 (moderate): Secondary | ICD-10-CM | POA: Diagnosis not present

## 2016-07-26 DIAGNOSIS — Z79899 Other long term (current) drug therapy: Secondary | ICD-10-CM | POA: Diagnosis not present

## 2016-07-26 DIAGNOSIS — Z9221 Personal history of antineoplastic chemotherapy: Secondary | ICD-10-CM | POA: Insufficient documentation

## 2016-07-26 DIAGNOSIS — Z803 Family history of malignant neoplasm of breast: Secondary | ICD-10-CM | POA: Insufficient documentation

## 2016-07-26 LAB — COMPREHENSIVE METABOLIC PANEL
ALT: 20 U/L (ref 14–54)
AST: 34 U/L (ref 15–41)
Albumin: 2.5 g/dL — ABNORMAL LOW (ref 3.5–5.0)
Alkaline Phosphatase: 98 U/L (ref 38–126)
Anion gap: 9 (ref 5–15)
BUN: 45 mg/dL — AB (ref 6–20)
CHLORIDE: 98 mmol/L — AB (ref 101–111)
CO2: 23 mmol/L (ref 22–32)
Calcium: 8.2 mg/dL — ABNORMAL LOW (ref 8.9–10.3)
Creatinine, Ser: 1.34 mg/dL — ABNORMAL HIGH (ref 0.44–1.00)
GFR calc Af Amer: 45 mL/min — ABNORMAL LOW (ref >60)
GFR, EST NON AFRICAN AMERICAN: 39 mL/min — AB (ref >60)
Glucose, Bld: 114 mg/dL — ABNORMAL HIGH (ref 65–99)
Potassium: 4.6 mmol/L (ref 3.5–5.1)
Sodium: 130 mmol/L — ABNORMAL LOW (ref 135–145)
Total Bilirubin: 2.1 mg/dL — ABNORMAL HIGH (ref 0.3–1.2)
Total Protein: 5.8 g/dL — ABNORMAL LOW (ref 6.5–8.1)

## 2016-07-26 LAB — IRON AND TIBC
IRON: 50 ug/dL (ref 28–170)
Saturation Ratios: 25 % (ref 10.4–31.8)
TIBC: 202 ug/dL — AB (ref 250–450)
UIBC: 152 ug/dL

## 2016-07-26 LAB — CBC WITH DIFFERENTIAL/PLATELET
BASOS ABS: 0 10*3/uL (ref 0–0.1)
BASOS PCT: 1 %
EOS ABS: 0 10*3/uL (ref 0–0.7)
EOS PCT: 1 %
HCT: 34.1 % — ABNORMAL LOW (ref 35.0–47.0)
Hemoglobin: 11.5 g/dL — ABNORMAL LOW (ref 12.0–16.0)
LYMPHS PCT: 14 %
Lymphs Abs: 0.9 10*3/uL — ABNORMAL LOW (ref 1.0–3.6)
MCH: 29.9 pg (ref 26.0–34.0)
MCHC: 33.8 g/dL (ref 32.0–36.0)
MCV: 88.6 fL (ref 80.0–100.0)
MONO ABS: 0.9 10*3/uL (ref 0.2–0.9)
Monocytes Relative: 14 %
Neutro Abs: 4.7 10*3/uL (ref 1.4–6.5)
Neutrophils Relative %: 70 %
PLATELETS: 216 10*3/uL (ref 150–440)
RBC: 3.84 MIL/uL (ref 3.80–5.20)
RDW: 16 % — AB (ref 11.5–14.5)
WBC: 6.6 10*3/uL (ref 3.6–11.0)

## 2016-07-26 LAB — APTT: APTT: 42 s — AB (ref 24–36)

## 2016-07-26 LAB — VITAMIN B12: VITAMIN B 12: 700 pg/mL (ref 180–914)

## 2016-07-26 LAB — FERRITIN: Ferritin: 425 ng/mL — ABNORMAL HIGH (ref 11–307)

## 2016-07-26 NOTE — Progress Notes (Signed)
Patient here today for follow up.   

## 2016-07-26 NOTE — Progress Notes (Signed)
[place] NOTE  Patient Care Team: Crecencio Mc, MD as PCP - General (Internal Medicine) Robert Bellow, MD as Consulting Physician (General Surgery) Pollyann Glen, RN as [place] Management Kem Parkinson, [facility] as [place] Management (Pharmacist)  CHIEF COMPLAINTS/PURPOSE OF CONSULTATION:   Oncology History   # July-AUG [phone] x 10 cm lesion in the liver; MRI- enhancing/ left portal vein ;PET- uptake limited to liver. Ca 125- 1500; APF- N; Ca- 19-9-N.   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  female with Cholangiocarcinoma intrahepatic status post y90  Approximately 4 Months ago; also refractory ascites needing paracentesis every 1-2 weeks.  Patient notes to have worsened abdominal distention. Continues to have swelling in the legs. She is currently taking Lasix 60 mg a day.  Denies any worsened shortness of breath or cough. Denies any fevers. She feels extremely fatigued fatigue. She complains of easy bruising. She is concerned about weight gain.  ROS: A complete 10 point review of system is done which is negative except mentioned above in history of present illness  MEDICAL HISTORY:  Past Medical History:  Diagnosis Date  . Ascites 01/09/2006   Ascites - 3L drawn off on 01/04/2016  . Breast lump in female 2014  . Diabetes mellitus without complication ([place])   . Elevated blood pressure reading   . Hypertension   . Liver mass 01/10/2016   Per patient; bloating.  Mass found  via [place], CT, and MRI.     SURGICAL HISTORY: Past Surgical History:  Procedure Laterality Date  . BREAST BIOPSY  1996  . BREAST BIOPSY  2014   Calcifications - Byrnett  . BREAST LUMPECTOMY    . SHOULDER SURGERY  2011   right , [place]     SOCIAL HISTORY: no smoking/ alcohol.Gillermina Hu.. Self..  Social History   Social History  . Marital status: Divorced    Spouse name: N/A  . Number of children: N/A  . Years of education: N/A   Occupational History  . Not on file.   Social History Main Topics  . Smoking status: Never Smoker  . Smokeless tobacco: Never Used  . Alcohol use No  . Drug use: No  . Sexual activity: Not on file   Other Topics Concern  . Not on file   Social History Narrative  . No narrative on file    FAMILY HISTORY:  Family History  Problem Relation Age of Onset  . Cancer Mother 29    Breast Cancer  . Breast cancer Mother   . Asthma Father   . Diabetes Father     ALLERGIES:  is allergic to adhesive [tape].  MEDICATIONS:  Current Outpatient Prescriptions  Medication Sig Dispense Refill  . BAYER CONTOUR NEXT TEST test strip CHECK SUGAR DAILY 100 each 3  . furosemide (LASIX) 20 MG tablet TAKE 2 TABLETS BY MOUTH DAILY 60 tablet 3  . Lancets (ONETOUCH ULTRASOFT) lancets Use one  time daily to scheck blood sugars   250.00 100 each 3   No current facility-administered medications for this visit.       Marland Kitchen  PHYSICAL EXAMINATION: ECOG PERFORMANCE STATUS: 1 - Symptomatic but completely ambulatory  Vitals:   07/26/16 1426  BP: 107/65  Pulse: 81  Temp: (!) 96.4 F (35.8 C)   Filed Weights   07/26/16 1426  Weight: 189 lb 8 oz (86 kg)    GENERAL: Well-nourished well-developed; Alert, no distress and comfortable.  She is Accompanied by her daughter. She is walking herself. EYES: no pallor or icterus OROPHARYNX: no thrush or ulceration; good dentition  NECK: supple, no masses felt LYMPH:  no palpable lymphadenopathy in the cervical, axillary or  inguinal regions LUNGS: Decreased breath sounds to auscultation left lower base and  No wheeze or crackles HEART/CVS: regular rate & rhythm and no murmurs; No lower extremity edema ABDOMEN: abdomen soft, non-tender and normal bowel sounds; positive for distention. Positive for hepatomegaly and splenomegaly. Positive for ascites. Musculoskeletal:no cyanosis of digits and no clubbing  PSYCH: alert & oriented x 3 with fluent speech NEURO: no focal motor/sensory deficits SKIN:  Multiple bruises noted over the skin.  LABORATORY DATA:  I have reviewed the data as listed Lab Results  Component Value Date   WBC 6.6 07/26/2016   HGB 11.5 (L) 07/26/2016   HCT 34.1 (L) 07/26/2016   MCV 88.6 07/26/2016   PLT 216 07/26/2016    Recent Labs  12/15/15 1159  05/08/16 1313 06/01/16 1420 06/14/16 1500 07/26/16 1400  NA  --   < > 134* 129* 129* 130*  K  --   < > 4.4 4.7 5.0 4.6  CL  --   < > 97* 96* 95* 98*  CO2  --   < > 28 26 25 23   GLUCOSE  --   < > 126* 105* 109* 114*  BUN  --   < > 44* 41* 36* 45*  CREATININE  --   < > 1.31* 1.27* 1.26* 1.34*  CALCIUM  --   < > 8.4* 8.4* 8.3* 8.2*  GFRNONAA  --   < > 40* 41* 42* 39*  GFRAA  --   < > 46* 48* 48* 45*  PROT 6.3  < > 5.9*  --  5.7* 5.8*  ALBUMIN 3.3*  < > 2.5*  --  2.4* 2.5*  AST 26  < > 40  --  59* 34  ALT 10  < > 19  --  20 20  ALKPHOS 81  < > 101  --  108 98  BILITOT 1.5*  < > 2.5*  --  2.5* 2.1*  BILIDIR 0.5*  --   --   --   --   --   < > = values in this interval not displayed.  RADIOGRAPHIC STUDIES: I have personally reviewed the radiological images as listed and agreed with the findings in the report. US Paracentesis  Result Date: 07/17/2016 INDICATION: 71 year old female with recurrent ascites EXAM: ULTRASOUND GUIDED  PARACENTESIS MEDICATIONS: None. COMPLICATIONS: None PROCEDURE: Informed written consent was obtained from the patient after a discussion of the risks, benefits and alternatives to treatment. A timeout was  performed prior to the initiation of the procedure. Initial ultrasound scanning demonstrates a moderate amount of ascites within the right lower abdominal quadrant. The right lower abdomen was prepped and draped in the usual sterile fashion. 1% lidocaine with epinephrine was used for local anesthesia. Following this, a 8 Fr  Safe-T-Centesis catheter was introduced. An ultrasound image was saved for documentation purposes. The paracentesis was performed. The catheter was removed and a dressing was applied. The patient tolerated the procedure well without immediate post procedural complication. FINDINGS: A total of approximately 3.5 L of thin fluid was removed. IMPRESSION: Status post ultrasound-guided paracentesis. Signed, Dulcy Fanny. Earleen Newport, DO [facility] Electronically Signed   By: Corrie Mckusick D.O.   On: 07/17/2016 15:59   US Paracentesis  Result Date: 07/07/2016 INDICATION: Cholangiocarcinoma and recurrent symptomatic ascites. EXAM: ULTRASOUND GUIDED  PARACENTESIS MEDICATIONS: None. COMPLICATIONS: None immediate. PROCEDURE: Informed written consent was obtained from the patient after a discussion of the risks, benefits and alternatives to treatment. A timeout was performed prior to the initiation of the procedure. Initial ultrasound scanning demonstrates a large amount of ascites within the left lower abdominal quadrant. The left lower abdomen was prepped and draped in the usual sterile fashion. 1% lidocaine was used for local anesthesia. Following this, a 6 Fr Safe-T-Centesis catheter was introduced. An ultrasound image was saved for documentation purposes. The paracentesis was performed. The catheter was removed and a dressing was applied. The patient tolerated the procedure well without immediate post procedural complication. FINDINGS: A total of approximately 3.5 L of cloudy yellow fluid was removed. IMPRESSION: Successful ultrasound-guided  paracentesis yielding 3.5 liters of peritoneal fluid. Electronically Signed   By: Markus Daft M.D.   On: 07/07/2016 10:45   US Paracentesis  Result Date: 06/27/2016 INDICATION: Ascites EXAM: ULTRASOUND GUIDED RIGHT LOWER QUADRANT PARACENTESIS MEDICATIONS: None. COMPLICATIONS: None immediate. PROCEDURE: Informed written consent was obtained from the patient after a discussion of the risks, benefits and alternatives to treatment. A timeout was performed prior to the initiation of the procedure. Initial ultrasound scanning demonstrates a large amount of ascites within the right lower abdominal quadrant. The right lower abdomen was prepped and draped in the usual sterile fashion. 1% lidocaine with epinephrine was used for local anesthesia. Following this, a Safe-T-Centesis catheter was introduced. An ultrasound image was saved for documentation purposes. The paracentesis was performed. The catheter was removed and a dressing was applied. The patient tolerated the procedure well without immediate post procedural complication. FINDINGS: A total of approximately 3.5 L of yellow fluid was removed. IMPRESSION: Successful ultrasound-guided paracentesis yielding 3.5 liters of peritoneal fluid. Electronically Signed   By: Marybelle Killings M.D.   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 Status post Y 90 [oct 30th 2017]. December 2017 MRI at [facility]-partial response. Awaiting repeat MRI at 3 months/April 2nd 2018/appt with Dr.Morse.    # Ascites/large volume needing frequent paracentesis [needing almost once every 1-2 weeks]-likely secondary to decompensated liver cirrhosis. Cytology negative multiple times. Proceed with paracentesis; standing order for weekly [place] given/check labs prior; also infuse albumin post paracentesis.   # Weight gain/bil LE swelling- [sec to  above]With weight gain recommend lasix 20mg [ 4 pills- 80mg /day] x1 week; to call us back with weight to adjust the diuretics.   # Chronic kidney disease stage III-creatinine 1.3 stable. currently improved. Discussed this is likely secondary to fluid shifts because of large volume paracentesis. For now continue Lasix. Hold off aldactone sec to previous hyperkalemia.   # Elevated liver function bilirubin 2.5 likely secondary to underlying malignancy cirrhosis.   # follow up with me April  9th week/labs. Patient will call us for paracentesis sooner if needed.    Cammie Sickle, MD 07/26/2016 4:01 PM

## 2016-07-26 NOTE — Assessment & Plan Note (Addendum)
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 Status post Y 90 [oct 30th 2017]. December 2017 MRI at [facility]-partial response. Awaiting repeat MRI at 3 months/April 2nd 2018/appt with Dr.Morse.    # Ascites/large volume needing frequent paracentesis [needing almost once every 1-2 weeks]-likely secondary to decompensated liver cirrhosis. Cytology negative multiple times. Proceed with paracentesis; standing order for weekly [place] given/check labs prior; also infuse albumin post paracentesis.   # Weight gain/bil LE swelling- [sec to above]With weight gain recommend lasix 20mg [ 4 pills- 80mg /day] x1 week; to call us back with weight to adjust the diuretics.   # Chronic kidney disease stage III-creatinine 1.3 stable. currently improved. Discussed this is likely secondary to fluid shifts because of large volume paracentesis. For now continue Lasix. Hold off aldactone sec to previous hyperkalemia.   # Elevated liver function bilirubin 2.5 likely secondary to underlying malignancy cirrhosis.   # follow up with me April 9th week/labs. Patient will call us for paracentesis sooner if needed.

## 2016-07-27 ENCOUNTER — Other Ambulatory Visit: Payer: Self-pay | Admitting: Internal Medicine

## 2016-07-27 ENCOUNTER — Telehealth: Payer: Self-pay | Admitting: *Deleted

## 2016-07-27 DIAGNOSIS — R188 Other ascites: Secondary | ICD-10-CM

## 2016-07-27 DIAGNOSIS — C221 Intrahepatic bile duct carcinoma: Secondary | ICD-10-CM

## 2016-07-27 DIAGNOSIS — K7460 Unspecified cirrhosis of liver: Secondary | ICD-10-CM

## 2016-07-27 LAB — VITAMIN D 25 HYDROXY (VIT D DEFICIENCY, FRACTURES): VIT D 25 HYDROXY: 25.1 ng/mL — AB (ref 30.0–100.0)

## 2016-07-27 MED ORDER — ERGOCALCIFEROL 1.25 MG (50000 UT) PO CAPS: 50000.0000 [IU] | ORAL_CAPSULE | ORAL | 1 refills | Status: DC

## 2016-07-27 NOTE — Telephone Encounter (Signed)
rn spoke with patient - Patient made aware that vit. D 50,000 units rx called to pharmacy. Explained that her levels of vitamin d demonstrated insufficiency. She will pick up rx today. She is also aware of her paracentesis date.

## 2016-07-27 NOTE — Telephone Encounter (Signed)
-----   Message from Cammie Sickle, MD sent at 07/27/2016  2:13 PM EST ----- Please inform pt that her vit D is slightly low; B12- normal; recommend vit D- I have sent the script to her pharmacy. Dr.B

## 2016-07-28 ENCOUNTER — Ambulatory Visit
Admission: RE | Admit: 2016-07-28 | Discharge: 2016-07-28 | Disposition: A | Discharge status: Home / Self Care | Payer: 59 | Source: Ambulatory Visit | Attending: Internal Medicine | Admitting: Internal Medicine

## 2016-07-28 DIAGNOSIS — K7460 Unspecified cirrhosis of liver: Secondary | ICD-10-CM

## 2016-07-28 DIAGNOSIS — C221 Intrahepatic bile duct carcinoma: Secondary | ICD-10-CM | POA: Diagnosis not present

## 2016-07-28 DIAGNOSIS — R188 Other ascites: Secondary | ICD-10-CM | POA: Diagnosis not present

## 2016-07-28 MED ORDER — ALBUMIN HUMAN 25 % IV SOLN
25.0000 g | Freq: Once | INTRAVENOUS | Status: AC
  Administered 2016-07-28: 25 g via INTRAVENOUS
  Filled 2016-07-28: qty 100

## 2016-07-28 NOTE — Procedures (Signed)
Successful US guided paracentesis yielding 3.6 L of milky serous ascitic fluid. EBL: None No immediate post procedural complications.   Ronny Bacon, MD Pager #: [phone]

## 2016-08-02 ENCOUNTER — Telehealth: Payer: Self-pay | Admitting: *Deleted

## 2016-08-02 ENCOUNTER — Inpatient Hospital Stay: Payer: 59

## 2016-08-02 DIAGNOSIS — R188 Other ascites: Secondary | ICD-10-CM | POA: Diagnosis not present

## 2016-08-02 DIAGNOSIS — N183 Chronic kidney disease, stage 3 (moderate): Secondary | ICD-10-CM | POA: Diagnosis not present

## 2016-08-02 DIAGNOSIS — R948 Abnormal results of function studies of other organs and systems: Secondary | ICD-10-CM | POA: Diagnosis not present

## 2016-08-02 DIAGNOSIS — C221 Intrahepatic bile duct carcinoma: Secondary | ICD-10-CM | POA: Diagnosis not present

## 2016-08-02 DIAGNOSIS — K7460 Unspecified cirrhosis of liver: Secondary | ICD-10-CM | POA: Diagnosis not present

## 2016-08-02 DIAGNOSIS — I129 Hypertensive chronic kidney disease with stage 1 through stage 4 chronic kidney disease, or unspecified chronic kidney disease: Secondary | ICD-10-CM | POA: Diagnosis not present

## 2016-08-02 DIAGNOSIS — M7989 Other specified soft tissue disorders: Secondary | ICD-10-CM | POA: Diagnosis not present

## 2016-08-02 DIAGNOSIS — R140 Abdominal distension (gaseous): Secondary | ICD-10-CM | POA: Diagnosis not present

## 2016-08-02 DIAGNOSIS — R5383 Other fatigue: Secondary | ICD-10-CM | POA: Diagnosis not present

## 2016-08-02 LAB — BASIC METABOLIC PANEL
Anion gap: 6 (ref 5–15)
BUN: 45 mg/dL — ABNORMAL HIGH (ref 6–20)
CHLORIDE: 98 mmol/L — AB (ref 101–111)
CO2: 25 mmol/L (ref 22–32)
Calcium: 8.4 mg/dL — ABNORMAL LOW (ref 8.9–10.3)
Creatinine, Ser: 1.32 mg/dL — ABNORMAL HIGH (ref 0.44–1.00)
GFR calc Af Amer: 46 mL/min — ABNORMAL LOW (ref >60)
GFR calc non Af Amer: 40 mL/min — ABNORMAL LOW (ref >60)
Glucose, Bld: 111 mg/dL — ABNORMAL HIGH (ref 65–99)
POTASSIUM: 5.1 mmol/L (ref 3.5–5.1)
Sodium: 129 mmol/L — ABNORMAL LOW (ref 135–145)

## 2016-08-02 NOTE — Telephone Encounter (Signed)
Schedule worksheet given to [facility] in cancer ctr scheduling team. Will schedule patient for labs and paracentesis.

## 2016-08-02 NOTE — Telephone Encounter (Signed)
Notified Parker that per Vickki Muff RN for Dr Rogue Bussing that the standing order is good for weekly use , but she will need labs drawn first

## 2016-08-02 NOTE — Telephone Encounter (Signed)
Asking if the standing order for paracentesis is ok for weekly procedures. She has been getting them every other week and she has called stating she is very uncomfortable and needs one this week too. Please advise

## 2016-08-03 ENCOUNTER — Ambulatory Visit
Admission: RE | Admit: 2016-08-03 | Discharge: 2016-08-03 | Disposition: A | Discharge status: Home / Self Care | Payer: 59 | Source: Ambulatory Visit | Attending: Internal Medicine | Admitting: Internal Medicine

## 2016-08-03 DIAGNOSIS — C221 Intrahepatic bile duct carcinoma: Secondary | ICD-10-CM | POA: Insufficient documentation

## 2016-08-03 DIAGNOSIS — K7460 Unspecified cirrhosis of liver: Secondary | ICD-10-CM | POA: Insufficient documentation

## 2016-08-03 DIAGNOSIS — R188 Other ascites: Secondary | ICD-10-CM | POA: Insufficient documentation

## 2016-08-03 MED ORDER — ALBUMIN HUMAN 25 % IV SOLN
25.0000 g | Freq: Once | INTRAVENOUS | Status: DC
Start: 2016-08-03 — End: 2016-08-04
  Filled 2016-08-03: qty 100

## 2016-08-10 ENCOUNTER — Telehealth: Payer: Self-pay | Admitting: *Deleted

## 2016-08-10 ENCOUNTER — Inpatient Hospital Stay: Payer: 59 | Attending: Internal Medicine

## 2016-08-10 DIAGNOSIS — C221 Intrahepatic bile duct carcinoma: Secondary | ICD-10-CM | POA: Insufficient documentation

## 2016-08-10 LAB — BASIC METABOLIC PANEL
Anion gap: 8 (ref 5–15)
BUN: 46 mg/dL — AB (ref 6–20)
CALCIUM: 8.3 mg/dL — AB (ref 8.9–10.3)
CO2: 25 mmol/L (ref 22–32)
Chloride: 98 mmol/L — ABNORMAL LOW (ref 101–111)
Creatinine, Ser: 1.44 mg/dL — ABNORMAL HIGH (ref 0.44–1.00)
GFR calc Af Amer: 41 mL/min — ABNORMAL LOW (ref >60)
GFR, EST NON AFRICAN AMERICAN: 36 mL/min — AB (ref >60)
GLUCOSE: 154 mg/dL — AB (ref 65–99)
POTASSIUM: 5.3 mmol/L — AB (ref 3.5–5.1)
Sodium: 131 mmol/L — ABNORMAL LOW (ref 135–145)

## 2016-08-10 NOTE — Telephone Encounter (Signed)
Standing orders are already in chart. msg sent to sch. To add pt to lab only today.

## 2016-08-10 NOTE — Telephone Encounter (Signed)
Having para tomorrow and asking if she needs labs today.

## 2016-08-11 ENCOUNTER — Ambulatory Visit: Payer: 59

## 2016-08-11 ENCOUNTER — Ambulatory Visit
Admission: RE | Admit: 2016-08-11 | Discharge: 2016-08-11 | Disposition: A | Discharge status: Home / Self Care | Payer: 59 | Source: Ambulatory Visit | Attending: Internal Medicine | Admitting: Internal Medicine

## 2016-08-11 DIAGNOSIS — C221 Intrahepatic bile duct carcinoma: Secondary | ICD-10-CM | POA: Diagnosis not present

## 2016-08-11 DIAGNOSIS — R188 Other ascites: Secondary | ICD-10-CM | POA: Insufficient documentation

## 2016-08-11 DIAGNOSIS — K7460 Unspecified cirrhosis of liver: Secondary | ICD-10-CM | POA: Insufficient documentation

## 2016-08-11 MED ORDER — ALBUMIN HUMAN 25 % IV SOLN
25.0000 g | Freq: Once | INTRAVENOUS | Status: AC
  Administered 2016-08-11: 25 g via INTRAVENOUS
  Filled 2016-08-11: qty 100

## 2016-08-11 NOTE — Procedures (Signed)
Successful US guided paracentesis.  Removed 4 liters of cloudy yellow fluid.  Minimal blood loss.  No immediate complication.

## 2016-08-16 ENCOUNTER — Telehealth: Payer: Self-pay | Admitting: *Deleted

## 2016-08-16 ENCOUNTER — Other Ambulatory Visit: Payer: Self-pay | Admitting: *Deleted

## 2016-08-16 DIAGNOSIS — C221 Intrahepatic bile duct carcinoma: Secondary | ICD-10-CM

## 2016-08-16 DIAGNOSIS — K7460 Unspecified cirrhosis of liver: Secondary | ICD-10-CM

## 2016-08-16 DIAGNOSIS — R188 Other ascites: Secondary | ICD-10-CM

## 2016-08-16 NOTE — Telephone Encounter (Signed)
Jennifer Ruiz in special recovery reports that pt is experiencing intermittent confusion. Pt has had h/o hyponatremia. Asked if Dr. Rogue Bussing could check an ammonia level to further evaluate pt. V/o obtained for cbc, metc, ammonia level. Labs to be drawn on 08/17/16

## 2016-08-17 ENCOUNTER — Other Ambulatory Visit: Payer: Self-pay | Admitting: *Deleted

## 2016-08-17 ENCOUNTER — Other Ambulatory Visit: Payer: Self-pay | Admitting: Internal Medicine

## 2016-08-17 ENCOUNTER — Telehealth: Payer: Self-pay | Admitting: *Deleted

## 2016-08-17 ENCOUNTER — Inpatient Hospital Stay: Payer: 59

## 2016-08-17 DIAGNOSIS — C221 Intrahepatic bile duct carcinoma: Secondary | ICD-10-CM

## 2016-08-17 DIAGNOSIS — R7989 Other specified abnormal findings of blood chemistry: Secondary | ICD-10-CM

## 2016-08-17 LAB — CBC WITH DIFFERENTIAL/PLATELET
Basophils Absolute: 0 10*3/uL (ref 0–0.1)
Basophils Relative: 1 %
EOS PCT: 1 %
Eosinophils Absolute: 0.1 10*3/uL (ref 0–0.7)
HCT: 33.2 % — ABNORMAL LOW (ref 35.0–47.0)
HEMOGLOBIN: 11.2 g/dL — AB (ref 12.0–16.0)
LYMPHS PCT: 15 %
Lymphs Abs: 1.1 10*3/uL (ref 1.0–3.6)
MCH: 30 pg (ref 26.0–34.0)
MCHC: 33.8 g/dL (ref 32.0–36.0)
MCV: 88.7 fL (ref 80.0–100.0)
MONOS PCT: 14 %
Monocytes Absolute: 1 10*3/uL — ABNORMAL HIGH (ref 0.2–0.9)
Neutro Abs: 4.9 10*3/uL (ref 1.4–6.5)
Neutrophils Relative %: 69 %
Platelets: 199 10*3/uL (ref 150–440)
RBC: 3.74 MIL/uL — AB (ref 3.80–5.20)
RDW: 16.3 % — ABNORMAL HIGH (ref 11.5–14.5)
WBC: 7.1 10*3/uL (ref 3.6–11.0)

## 2016-08-17 LAB — COMPREHENSIVE METABOLIC PANEL
ALBUMIN: 2.7 g/dL — AB (ref 3.5–5.0)
ALK PHOS: 84 U/L (ref 38–126)
ALT: 21 U/L (ref 14–54)
AST: 37 U/L (ref 15–41)
Anion gap: 6 (ref 5–15)
BUN: 50 mg/dL — ABNORMAL HIGH (ref 6–20)
CHLORIDE: 98 mmol/L — AB (ref 101–111)
CO2: 25 mmol/L (ref 22–32)
CREATININE: 1.45 mg/dL — AB (ref 0.44–1.00)
Calcium: 8.4 mg/dL — ABNORMAL LOW (ref 8.9–10.3)
GFR calc non Af Amer: 35 mL/min — ABNORMAL LOW (ref >60)
GFR, EST AFRICAN AMERICAN: 41 mL/min — AB (ref >60)
GLUCOSE: 128 mg/dL — AB (ref 65–99)
Potassium: 4.6 mmol/L (ref 3.5–5.1)
SODIUM: 129 mmol/L — AB (ref 135–145)
Total Bilirubin: 2.2 mg/dL — ABNORMAL HIGH (ref 0.3–1.2)
Total Protein: 5.5 g/dL — ABNORMAL LOW (ref 6.5–8.1)

## 2016-08-17 LAB — AMMONIA: Ammonia: 123 umol/L — ABNORMAL HIGH (ref 9–35)

## 2016-08-17 MED ORDER — LACTULOSE 20 GM/30ML PO SOLN: 30.0000 mL | Freq: Three times a day (TID) | ORAL | 3 refills | Status: DC

## 2016-08-17 NOTE — Telephone Encounter (Signed)
Dr. Delton See via phone to address high ammonia level of 123. md gave v/o to send rx for lactulose 20 mg/30 ml with the instructions to have patient take 30 to 45 mls 3 to 4 times a day to produce 2 to 3 soft stools/day. Instructed to give 250 mls, 3 Refills.  Pt contacted. Explained that her ammonia levels were high. I explained that the ammonia levels has built up in her body and she is not able to get rid of the excessive urea on her own. Pt educated that high levels of ammonia can cause confusion, extreme fatigue and could cause life threatening complications to her care.  I explained that the doctor wanted to prescribe Lactulose to help her body get rid of the ammonia levels in her blood by excreting the ammonia in her stools.  Pt educated that the lactulose may cause water stools. The goal for the patient is to have 2 to 3 soft stools/day. She was instructed to take 30 mLs (20 g total) by mouth 3 (three)  To 4 (four) times daily in order to produce 2 to 3 soft stools/day. Pt instructed to continue to take the lactulose until next Monday 08/21/16. Pt provided with an apt on Monday at 1 pm for metb/ammonia level lab check. Pt may go for her paracentesis tomorrow as scheduled. Teach back process performed with patient.  Pt also then inquired why the ammonia level was drawn. I explained that Juanita, her nurse for the paracentesis in special recovery, called cancer center with concerns of patient's lab values. Pt's sodium level has been low in the past. RN in special recovery reported that patient has seemed slightly confused. RN advocated for an ammonia level to be drawn. I spoke with Dr. Rogue Bussing and he agreed to have the specimen drawn.   Pt reports that she has not been confused. She states that her "dept nurses at [facility] are always telling me that they have noticed that I'm easily confused, but I don't have any clue of why they tell me this. But I'm glad they found out the my levels were high, so  we can do something about it."

## 2016-08-17 NOTE — Telephone Encounter (Signed)
Addendum: Also discussed with patient regarding her low sodium levels. Pt states that she watches her sodium and eats a low sodium diet.  Due to hyponatremia, Pt educated to increase her dietary intake with foods higher in sodium content to help her low sodium levels. Teach back process performed with pt.

## 2016-08-17 NOTE — Telephone Encounter (Signed)
Patient went to pick up Lasix Rx but told them she is taking 4 tabs (80 mg) daily. After reading the 2/21 office note, I see that Dr B increased her lasix times 1 week and she was to call back to office with her weight for further instructions and never called back. She reports her weight today at 180 lbs down 9.8 lbs and she is having a paracentesis tomorrow. Please advise of dosing for lasix

## 2016-08-17 NOTE — Telephone Encounter (Signed)
Per Dr Rogue Bussing go back to 40 mg daily. Pharmacy informed of dosing and will contact patient per [facility]

## 2016-08-18 ENCOUNTER — Ambulatory Visit
Admission: RE | Admit: 2016-08-18 | Discharge: 2016-08-18 | Disposition: A | Discharge status: Home / Self Care | Payer: 59 | Source: Ambulatory Visit | Attending: Internal Medicine | Admitting: Internal Medicine

## 2016-08-18 ENCOUNTER — Telehealth: Payer: Self-pay | Admitting: *Deleted

## 2016-08-18 MED ORDER — ALBUMIN HUMAN 25 % IV SOLN
25.0000 g | Freq: Once | INTRAVENOUS | Status: DC
  Filled 2016-08-18: qty 100

## 2016-08-18 NOTE — Telephone Encounter (Signed)
Called to state that she is loosing fluid from taking the lactulose, asking if she really needs to have the paracentesis. Per VO Dr Rogue Bussing, can hold off on Para. Patient informed and will call and cancel her para for today

## 2016-08-21 ENCOUNTER — Inpatient Hospital Stay: Payer: 59

## 2016-08-21 DIAGNOSIS — C221 Intrahepatic bile duct carcinoma: Secondary | ICD-10-CM | POA: Diagnosis not present

## 2016-08-21 DIAGNOSIS — R7989 Other specified abnormal findings of blood chemistry: Secondary | ICD-10-CM

## 2016-08-21 LAB — BASIC METABOLIC PANEL
Anion gap: 5 (ref 5–15)
BUN: 52 mg/dL — AB (ref 6–20)
CO2: 26 mmol/L (ref 22–32)
CREATININE: 1.53 mg/dL — AB (ref 0.44–1.00)
Calcium: 8.6 mg/dL — ABNORMAL LOW (ref 8.9–10.3)
Chloride: 102 mmol/L (ref 101–111)
GFR calc Af Amer: 38 mL/min — ABNORMAL LOW (ref >60)
GFR calc non Af Amer: 33 mL/min — ABNORMAL LOW (ref >60)
GLUCOSE: 171 mg/dL — AB (ref 65–99)
POTASSIUM: 4.3 mmol/L (ref 3.5–5.1)
SODIUM: 133 mmol/L — AB (ref 135–145)

## 2016-08-21 LAB — AMMONIA: AMMONIA: 94 umol/L — AB (ref 9–35)

## 2016-08-24 ENCOUNTER — Inpatient Hospital Stay: Payer: 59

## 2016-08-24 ENCOUNTER — Ambulatory Visit
Admission: RE | Admit: 2016-08-24 | Discharge: 2016-08-24 | Disposition: A | Discharge status: Home / Self Care | Payer: 59 | Source: Ambulatory Visit | Attending: Internal Medicine | Admitting: Internal Medicine

## 2016-08-24 ENCOUNTER — Other Ambulatory Visit: Payer: Self-pay | Admitting: *Deleted

## 2016-08-24 DIAGNOSIS — K7460 Unspecified cirrhosis of liver: Secondary | ICD-10-CM | POA: Insufficient documentation

## 2016-08-24 DIAGNOSIS — C221 Intrahepatic bile duct carcinoma: Secondary | ICD-10-CM

## 2016-08-24 DIAGNOSIS — R188 Other ascites: Secondary | ICD-10-CM | POA: Diagnosis not present

## 2016-08-24 LAB — BASIC METABOLIC PANEL
ANION GAP: 8 (ref 5–15)
BUN: 50 mg/dL — ABNORMAL HIGH (ref 6–20)
CALCIUM: 8.3 mg/dL — AB (ref 8.9–10.3)
CO2: 24 mmol/L (ref 22–32)
Chloride: 101 mmol/L (ref 101–111)
Creatinine, Ser: 1.45 mg/dL — ABNORMAL HIGH (ref 0.44–1.00)
GFR calc Af Amer: 41 mL/min — ABNORMAL LOW (ref >60)
GFR calc non Af Amer: 35 mL/min — ABNORMAL LOW (ref >60)
Glucose, Bld: 128 mg/dL — ABNORMAL HIGH (ref 65–99)
POTASSIUM: 4.5 mmol/L (ref 3.5–5.1)
SODIUM: 133 mmol/L — AB (ref 135–145)

## 2016-08-24 LAB — AMMONIA: AMMONIA: 78 umol/L — AB (ref 9–35)

## 2016-08-24 MED ORDER — ALBUMIN HUMAN 25 % IV SOLN
25.0000 g | Freq: Once | INTRAVENOUS | Status: AC
  Administered 2016-08-24: 25 g via INTRAVENOUS
  Filled 2016-08-24: qty 100

## 2016-08-24 NOTE — Procedures (Signed)
Pre Procedural Dx: Cholangiocarcinoma  Post Procedural Dx: Same  Successful US guided paracentesis yielding 3.4 L of serous ascitic fluid.  EBL: None  Complications: None immediate  Ronny Bacon, MD Pager #: [phone]

## 2016-08-24 NOTE — Discharge Instructions (Signed)
Paracentesis, Care After  Refer to this sheet in the next few weeks. These instructions provide you with information about caring for yourself after your procedure. Your health care provider may also give you more specific instructions. Your treatment has been planned according to current medical practices, but problems sometimes occur. Call your health care provider if you have any problems or questions after your procedure.  What can I expect after the procedure?  After your procedure, it is common to have a small amount of clear fluid coming from the puncture site.  Follow these instructions at home:  · Return to your normal activities as told by your health care provider. Ask your health care provider what activities are safe for you.  · Take over-the-counter and prescription medicines only as told by your health care provider.  · Do not take baths, swim, or use a hot tub until your health care provider approves.  · Follow instructions from your health care provider about:  ? How to take care of your puncture site.  ? When and how you should change your bandage (dressing).  ? When you should remove your dressing.  · Check your puncture area every day signs of infection. Watch for:  ? Redness, swelling, or pain.  ? Fluid, blood, or pus.  · Keep all follow-up visits as told by your health care provider. This is important.  Contact a health care provider if:  · You have redness, swelling, or pain at your puncture site.  · You start to have more clear fluid coming from your puncture site.  · You have blood or pus coming from your puncture site.  · You have chills.  · You have a fever.  Get help right away if:  · You develop chest pain or shortness of breath.  · You develop increasing pain, discomfort, or swelling in your abdomen.  · You feel dizzy or light-headed or you pass out.  This information is not intended to replace advice given to you by your health care provider. Make sure you discuss any questions you  have with your health care provider.  Document Released: 10/06/2014 Document Revised: 10/28/2015 Document Reviewed: 08/04/2014  Elsevier Interactive Patient Education © 2017 Elsevier Inc.

## 2016-08-30 ENCOUNTER — Ambulatory Visit: Payer: Self-pay | Admitting: Physician Assistant

## 2016-08-30 ENCOUNTER — Encounter: Payer: Self-pay | Admitting: Physician Assistant

## 2016-08-30 VITALS — BP 132/62 | HR 88 | Temp 97.7°F

## 2016-08-30 DIAGNOSIS — H6983 Other specified disorders of Eustachian tube, bilateral: Secondary | ICD-10-CM

## 2016-08-30 MED ORDER — FLUTICASONE PROPIONATE 50 MCG/ACT NA SUSP: 2.0000 | Freq: Every day | NASAL | 6 refills | Status: AC

## 2016-08-30 NOTE — Progress Notes (Signed)
S:  C/o ears popping and being stopped up, no drainage from ears, no fever/chills, no cough or congestion, some sinus pressure, remainder ros neg. feelsl like she's in a tunnel Using otc meds without relief  O:  Vitals wnl, nad, tms dull b/l, nasal mucosa swollen, throat wnl, neck supple no lymph, lungs c t a, cv rrr, neuro intact  A: acute eustachean tube dysfunction  P: flonase, sudafed, return if not improving in 3 to 5 days, return earlier if worsening

## 2016-08-31 ENCOUNTER — Telehealth: Payer: Self-pay | Admitting: *Deleted

## 2016-08-31 ENCOUNTER — Inpatient Hospital Stay: Payer: 59

## 2016-08-31 DIAGNOSIS — C221 Intrahepatic bile duct carcinoma: Secondary | ICD-10-CM | POA: Diagnosis not present

## 2016-08-31 LAB — BASIC METABOLIC PANEL
ANION GAP: 8 (ref 5–15)
BUN: 55 mg/dL — ABNORMAL HIGH (ref 6–20)
CALCIUM: 8.7 mg/dL — AB (ref 8.9–10.3)
CO2: 24 mmol/L (ref 22–32)
Chloride: 100 mmol/L — ABNORMAL LOW (ref 101–111)
Creatinine, Ser: 1.58 mg/dL — ABNORMAL HIGH (ref 0.44–1.00)
GFR, EST AFRICAN AMERICAN: 37 mL/min — AB (ref >60)
GFR, EST NON AFRICAN AMERICAN: 32 mL/min — AB (ref >60)
GLUCOSE: 175 mg/dL — AB (ref 65–99)
POTASSIUM: 5.2 mmol/L — AB (ref 3.5–5.1)
Sodium: 132 mmol/L — ABNORMAL LOW (ref 135–145)

## 2016-08-31 NOTE — Telephone Encounter (Signed)
msg sent to cancer center sch. Team to sch. Lab apt and call pt with apt

## 2016-08-31 NOTE — Telephone Encounter (Signed)
Asking to come in for lab check so she can have para tomorrow

## 2016-09-01 ENCOUNTER — Other Ambulatory Visit: Payer: Self-pay

## 2016-09-01 ENCOUNTER — Ambulatory Visit
Admission: RE | Admit: 2016-09-01 | Discharge: 2016-09-01 | Disposition: A | Discharge status: Home / Self Care | Payer: 59 | Source: Ambulatory Visit | Attending: Internal Medicine | Admitting: Internal Medicine

## 2016-09-01 DIAGNOSIS — C221 Intrahepatic bile duct carcinoma: Secondary | ICD-10-CM | POA: Insufficient documentation

## 2016-09-01 DIAGNOSIS — K7460 Unspecified cirrhosis of liver: Secondary | ICD-10-CM | POA: Insufficient documentation

## 2016-09-01 DIAGNOSIS — R188 Other ascites: Secondary | ICD-10-CM | POA: Insufficient documentation

## 2016-09-01 MED ORDER — ALBUMIN HUMAN 25 % IV SOLN
25.0000 g | Freq: Once | INTRAVENOUS | Status: AC
  Administered 2016-09-01: 25 g via INTRAVENOUS
  Filled 2016-09-01: qty 100

## 2016-09-01 NOTE — Procedures (Signed)
Pre Procedural Dx: Symptomatic Ascites Post Procedural Dx: Same  Successful US guided paracentesis yielding 4 L of serous ascitic fluid.  EBL: None Complications: None immediate  Jay Delvina Mizzell, MD Pager #: [phone]   

## 2016-09-04 DIAGNOSIS — C249 Malignant neoplasm of biliary tract, unspecified: Secondary | ICD-10-CM | POA: Diagnosis not present

## 2016-09-04 DIAGNOSIS — C221 Intrahepatic bile duct carcinoma: Secondary | ICD-10-CM | POA: Diagnosis not present

## 2016-09-04 DIAGNOSIS — D689 Coagulation defect, unspecified: Secondary | ICD-10-CM | POA: Diagnosis not present

## 2016-09-06 ENCOUNTER — Other Ambulatory Visit: Payer: Self-pay | Admitting: Internal Medicine

## 2016-09-06 ENCOUNTER — Ambulatory Visit
Admission: RE | Admit: 2016-09-06 | Discharge: 2016-09-06 | Disposition: A | Discharge status: Home / Self Care | Payer: 59 | Source: Ambulatory Visit | Attending: Internal Medicine | Admitting: Internal Medicine

## 2016-09-06 ENCOUNTER — Other Ambulatory Visit: Payer: Self-pay

## 2016-09-06 DIAGNOSIS — R188 Other ascites: Secondary | ICD-10-CM | POA: Diagnosis not present

## 2016-09-06 DIAGNOSIS — C221 Intrahepatic bile duct carcinoma: Secondary | ICD-10-CM | POA: Insufficient documentation

## 2016-09-06 DIAGNOSIS — K7460 Unspecified cirrhosis of liver: Secondary | ICD-10-CM

## 2016-09-06 MED ORDER — ALBUMIN HUMAN 25 % IV SOLN
25.0000 g | Freq: Once | INTRAVENOUS | Status: AC
  Administered 2016-09-06: 25 g via INTRAVENOUS
  Filled 2016-09-06: qty 100

## 2016-09-06 NOTE — Procedures (Signed)
US guided paracentesis.  Removed 3.8 liters of milky yellow fluid.  No immediate complication.  Minimal blood loss.

## 2016-09-06 NOTE — Discharge Instructions (Signed)
Paracentesis, Care After  Refer to this sheet in the next few weeks. These instructions provide you with information about caring for yourself after your procedure. Your health care provider may also give you more specific instructions. Your treatment has been planned according to current medical practices, but problems sometimes occur. Call your health care provider if you have any problems or questions after your procedure.  What can I expect after the procedure?  After your procedure, it is common to have a small amount of clear fluid coming from the puncture site.  Follow these instructions at home:  · Return to your normal activities as told by your health care provider. Ask your health care provider what activities are safe for you.  · Take over-the-counter and prescription medicines only as told by your health care provider.  · Do not take baths, swim, or use a hot tub until your health care provider approves.  · Follow instructions from your health care provider about:  ? How to take care of your puncture site.  ? When and how you should change your bandage (dressing).  ? When you should remove your dressing.  · Check your puncture area every day signs of infection. Watch for:  ? Redness, swelling, or pain.  ? Fluid, blood, or pus.  · Keep all follow-up visits as told by your health care provider. This is important.  Contact a health care provider if:  · You have redness, swelling, or pain at your puncture site.  · You start to have more clear fluid coming from your puncture site.  · You have blood or pus coming from your puncture site.  · You have chills.  · You have a fever.  Get help right away if:  · You develop chest pain or shortness of breath.  · You develop increasing pain, discomfort, or swelling in your abdomen.  · You feel dizzy or light-headed or you pass out.  This information is not intended to replace advice given to you by your health care provider. Make sure you discuss any questions you  have with your health care provider.  Document Released: 10/06/2014 Document Revised: 10/28/2015 Document Reviewed: 08/04/2014  Elsevier Interactive Patient Education © 2017 Elsevier Inc.

## 2016-09-06 NOTE — Patient Outreach (Signed)
[facility]) Care Management  09/06/2016  [place] 05-30-1946 [id number]   Outreach e-mail to patient asking her to contact me by phone.   [name], RN, BA, [place], [place] Direct Dial:  [phone]  Fax:  [phone] E-mail: [email or web address] [address] [place], [place]  [address]

## 2016-09-07 NOTE — Patient Outreach (Signed)
[facility]) Care Management  09/07/2016  [place] 1945-09-04 [id number]   Spoke to Jennifer Ruiz today regarding her participation in the [employer] to [employer].  She informs me she has been diagnosed with liver cancer and will no longer need the benefits of the [employer].   I discussed her recent lab glucose of 175mg /dl noted in the chart- patient assured me she did not need testing strips and is not on medication covered by [employer].  I will discharge her from the program and send a letter to her and to her doctor.     [name], RN, BA, [place], [place] Direct Dial:  [phone]  Fax:  [phone] E-mail: [email or web address] [address], [place], [place]  [address]

## 2016-09-13 ENCOUNTER — Inpatient Hospital Stay (HOSPITAL_BASED_OUTPATIENT_CLINIC_OR_DEPARTMENT_OTHER): Payer: 59 | Admitting: Internal Medicine

## 2016-09-13 ENCOUNTER — Inpatient Hospital Stay: Payer: 59 | Attending: Internal Medicine

## 2016-09-13 DIAGNOSIS — R109 Unspecified abdominal pain: Secondary | ICD-10-CM | POA: Diagnosis not present

## 2016-09-13 DIAGNOSIS — R140 Abdominal distension (gaseous): Secondary | ICD-10-CM | POA: Diagnosis not present

## 2016-09-13 DIAGNOSIS — R531 Weakness: Secondary | ICD-10-CM | POA: Diagnosis not present

## 2016-09-13 DIAGNOSIS — M7989 Other specified soft tissue disorders: Secondary | ICD-10-CM | POA: Diagnosis not present

## 2016-09-13 DIAGNOSIS — K5900 Constipation, unspecified: Secondary | ICD-10-CM | POA: Insufficient documentation

## 2016-09-13 DIAGNOSIS — R5383 Other fatigue: Secondary | ICD-10-CM | POA: Insufficient documentation

## 2016-09-13 DIAGNOSIS — N183 Chronic kidney disease, stage 3 (moderate): Secondary | ICD-10-CM

## 2016-09-13 DIAGNOSIS — R188 Other ascites: Secondary | ICD-10-CM

## 2016-09-13 DIAGNOSIS — I129 Hypertensive chronic kidney disease with stage 1 through stage 4 chronic kidney disease, or unspecified chronic kidney disease: Secondary | ICD-10-CM | POA: Insufficient documentation

## 2016-09-13 DIAGNOSIS — R978 Other abnormal tumor markers: Secondary | ICD-10-CM | POA: Diagnosis not present

## 2016-09-13 DIAGNOSIS — K7290 Hepatic failure, unspecified without coma: Secondary | ICD-10-CM | POA: Insufficient documentation

## 2016-09-13 DIAGNOSIS — Z85528 Personal history of other malignant neoplasm of kidney: Secondary | ICD-10-CM | POA: Insufficient documentation

## 2016-09-13 DIAGNOSIS — C221 Intrahepatic bile duct carcinoma: Secondary | ICD-10-CM

## 2016-09-13 DIAGNOSIS — K7460 Unspecified cirrhosis of liver: Secondary | ICD-10-CM | POA: Diagnosis not present

## 2016-09-13 DIAGNOSIS — Z79899 Other long term (current) drug therapy: Secondary | ICD-10-CM | POA: Diagnosis not present

## 2016-09-13 DIAGNOSIS — E119 Type 2 diabetes mellitus without complications: Secondary | ICD-10-CM | POA: Diagnosis not present

## 2016-09-13 LAB — CBC WITH DIFFERENTIAL/PLATELET
BASOS ABS: 0.1 10*3/uL (ref 0–0.1)
Basophils Relative: 1 %
Eosinophils Absolute: 0.1 10*3/uL (ref 0–0.7)
Eosinophils Relative: 1 %
HCT: 34.5 % — ABNORMAL LOW (ref 35.0–47.0)
Hemoglobin: 11.7 g/dL — ABNORMAL LOW (ref 12.0–16.0)
LYMPHS ABS: 1.5 10*3/uL (ref 1.0–3.6)
LYMPHS PCT: 20 %
MCH: 30.8 pg (ref 26.0–34.0)
MCHC: 33.9 g/dL (ref 32.0–36.0)
MCV: 90.8 fL (ref 80.0–100.0)
Monocytes Absolute: 0.8 10*3/uL (ref 0.2–0.9)
Monocytes Relative: 11 %
NEUTROS ABS: 4.8 10*3/uL (ref 1.4–6.5)
NEUTROS PCT: 67 %
Platelets: 202 10*3/uL (ref 150–440)
RBC: 3.8 MIL/uL (ref 3.80–5.20)
RDW: 17.8 % — ABNORMAL HIGH (ref 11.5–14.5)
WBC: 7.2 10*3/uL (ref 3.6–11.0)

## 2016-09-13 LAB — COMPREHENSIVE METABOLIC PANEL
ALK PHOS: 80 U/L (ref 38–126)
ALT: 19 U/L (ref 14–54)
AST: 34 U/L (ref 15–41)
Albumin: 2.9 g/dL — ABNORMAL LOW (ref 3.5–5.0)
Anion gap: 8 (ref 5–15)
BUN: 67 mg/dL — ABNORMAL HIGH (ref 6–20)
CALCIUM: 9 mg/dL (ref 8.9–10.3)
CO2: 23 mmol/L (ref 22–32)
CREATININE: 1.74 mg/dL — AB (ref 0.44–1.00)
Chloride: 100 mmol/L — ABNORMAL LOW (ref 101–111)
GFR, EST AFRICAN AMERICAN: 33 mL/min — AB (ref >60)
GFR, EST NON AFRICAN AMERICAN: 28 mL/min — AB (ref >60)
Glucose, Bld: 159 mg/dL — ABNORMAL HIGH (ref 65–99)
Potassium: 4.8 mmol/L (ref 3.5–5.1)
SODIUM: 131 mmol/L — AB (ref 135–145)
Total Bilirubin: 2.9 mg/dL — ABNORMAL HIGH (ref 0.3–1.2)
Total Protein: 5.9 g/dL — ABNORMAL LOW (ref 6.5–8.1)

## 2016-09-13 NOTE — Progress Notes (Signed)
[facility] NOTE  Patient Care Team: Crecencio Mc, MD as PCP - General (Internal Medicine) Robert Bellow, MD as Consulting Physician (General Surgery)  CHIEF COMPLAINTS/PURPOSE OF CONSULTATION:   Oncology History   # July-AUG [phone] x 10 cm lesion in the liver; MRI- enhancing/ left portal vein ;PET- uptake limited to liver. Ca 125- 1500; APF- N; Ca- 19-9-N.   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  In the interim patient was evaluated [facility]- Dr. Leamon Arnt; MRI of the liver was done.   Patient continues to have swelling in the legs; and abdominal distention. She continues to need paracentesis every 1-2 weeks. She is currently taking Lasix 60 mg a day.  Denies any worsened shortness of breath or cough. Denies any fevers.   Patient complains of significant fatigue- progress to getting worse. She noted especially difficult for her to go to work; she is planning to retirement. Given the multiple paracentesis and IV catheter insertion- patient is interested in having Pleurx catheter; also a Mediport.  ROS: A complete 10 point review of system is done which is negative except mentioned above in history of present illness  MEDICAL HISTORY:  Past Medical History:  Diagnosis Date  . Ascites 01/09/2006   Ascites - 3L drawn off on 01/04/2016  . Breast lump in female 2014  . Cancer of kidney  ([place])   . Diabetes mellitus without complication ([place])   . Elevated blood pressure reading   . Hypertension   . Liver mass 01/10/2016   Per patient; bloating.  Mass found via [place], CT, and MRI.     SURGICAL HISTORY: Past Surgical History:  Procedure Laterality Date  . BREAST BIOPSY  1996  . BREAST BIOPSY  2014   Calcifications - [place]  . BREAST LUMPECTOMY    . SHOULDER SURGERY  2011   right , [place]     SOCIAL HISTORY: no smoking/ alcohol.Gillermina Hu.. Self..  Social History   Social History  . Marital status: Divorced    Spouse name: N/A  . Number of children: N/A  . Years of education: N/A   Occupational History  . Not on file.   Social History Main Topics  . Smoking status: Never Smoker  . Smokeless tobacco: Never Used  . Alcohol use No  . Drug use: No  . Sexual activity: Not on file   Other Topics Concern  . Not on file   Social History Narrative  . No narrative on file    FAMILY HISTORY:  Family History  Problem Relation Age of Onset  . Cancer Mother 72    Breast Cancer  . Breast cancer Mother   . Asthma Father   . Diabetes Father     ALLERGIES:  is allergic to adhesive [tape].  MEDICATIONS:  Current Outpatient Prescriptions  Medication Sig Dispense Refill  . ergocalciferol (  VITAMIN D2) 50000 units capsule Take 1 capsule (50,000 Units total) by mouth once a week. 12 capsule 1  . fluticasone (FLONASE) 50 MCG/ACT nasal spray Place 2 sprays into both nostrils daily. 16 g 6  . furosemide (LASIX) 20 MG tablet TAKE 2 TABLETS BY MOUTH DAILY (Patient taking differently: TAKE 2 TABLETS BY MOUTH TWICE DAILY) 60 tablet 3   No current facility-administered medications for this visit.       Marland Kitchen  PHYSICAL EXAMINATION: ECOG PERFORMANCE STATUS: 1 - Symptomatic but completely ambulatory  Vitals:   09/13/16 1430  BP: (!) 113/59  Pulse: 87  Resp: 20  Temp: 97.7 F (36.5 C)   There were no vitals filed for this visit.  GENERAL: Chronically  sick-appearing female patient. Alert, no distress and comfortable.  She is alone. . She is walking herself. EYES: no pallor; positive for mild icterus. OROPHARYNX: no thrush or ulceration; good dentition  NECK: supple, no masses felt LYMPH:  no palpable lymphadenopathy in the cervical, axillary or inguinal regions LUNGS: Decreased breath sounds to auscultation left lower base and  No wheeze or crackles HEART/CVS: regular rate & rhythm and no murmurs; positive for 2 plus lower extremity edema ABDOMEN: abdomen soft, non-tender and normal bowel sounds; positive for distention. Positive for hepatomegaly and splenomegaly. Positive for ascites. Musculoskeletal:no cyanosis of digits and no clubbing  PSYCH: alert & oriented x 3 with fluent speech NEURO: no focal motor/sensory deficits SKIN:  Multiple bruises noted over the skin.  LABORATORY DATA:  I have reviewed the data as listed Lab Results  Component Value Date   WBC 7.2 09/13/2016   HGB 11.7 (L) 09/13/2016   HCT 34.5 (L) 09/13/2016   MCV 90.8 09/13/2016   PLT 202 09/13/2016    Recent Labs  12/15/15 1159  07/26/16 1400  08/17/16 1310  08/24/16 0930 08/31/16 1652 09/13/16 1405  NA  --   < > 130*  < > 129*  < > 133* 132* 131*  K  --   < > 4.6  < > 4.6  < > 4.5 5.2* 4.8  CL  --   < > 98*  < > 98*  < > 101 100* 100*  CO2  --   < > 23  < > 25  < > 24 24 23   GLUCOSE  --   < > 114*  < > 128*  < > 128* 175* 159*  BUN  --   < > 45*  < > 50*  < > 50* 55* 67*  CREATININE  --   < > 1.34*  < > 1.45*  < > 1.45* 1.58* 1.74*  CALCIUM  --   < > 8.2*  < > 8.4*  < > 8.3* 8.7* 9.0  GFRNONAA  --   < > 39*  < > 35*  < > 35* 32* 28*  GFRAA  --   < > 45*  < > 41*  < > 41* 37* 33*  PROT 6.3  < > 5.8*  --  5.5*  --   --   --  5.9*  ALBUMIN 3.3*  < > 2.5*  --  2.7*  --   --   --  2.9*  AST 26  < > 34  --  37  --   --   --  34  ALT 10  < > 20  --  21  --   --   --  19  ALKPHOS 81  < >  98  --  84  --   --   --  80  BILITOT 1.5*  < > 2.1*  --  2.2*  --    --   --  2.9*  BILIDIR 0.5*  --   --   --   --   --   --   --   --   < > = values in this interval not displayed.  RADIOGRAPHIC STUDIES: I have personally reviewed the radiological images as listed and agreed with the findings in the report. US Paracentesis  Result Date: 09/06/2016 INDICATION: Recurrent ascites.  Cholangiocarcinoma. EXAM: ULTRASOUND GUIDED PARACENTESIS MEDICATIONS: None. COMPLICATIONS: None immediate. PROCEDURE: Informed written consent was obtained from the patient after a discussion of the risks, benefits and alternatives to treatment. A timeout was performed prior to the initiation of the procedure. Initial ultrasound scanning demonstrates a large amount of ascites within the left lower abdominal quadrant. The left lower abdomen was prepped and draped in the usual sterile fashion. 1% lidocaine was used for local anesthesia. Following this, a 6 Fr Safe-T-Centesis catheter was introduced. An ultrasound image was saved for documentation purposes. The paracentesis was performed. The catheter was removed and a dressing was applied. The patient tolerated the procedure well without immediate post procedural complication. FINDINGS: A total of approximately 3.8 L of milky yellow fluid was removed. IMPRESSION: Successful ultrasound-guided paracentesis yielding 3.8 liters of peritoneal fluid. Electronically Signed   By: Markus Daft M.D.   On: 09/06/2016 18:37   US Paracentesis  Result Date: 09/01/2016 INDICATION: History cholangiocarcinoma, now with recurrent symptomatic ascites. EXAM: ULTRASOUND-GUIDED PARACENTESIS COMPARISON:  Multiple previous ultrasound-guided paracenteses, most recently on 08/24/2016 MEDICATIONS: None. COMPLICATIONS: None immediate. TECHNIQUE: Informed written consent was obtained from the patient after a discussion of the risks, benefits and alternatives to treatment. A timeout was performed prior to the initiation of the procedure. Initial ultrasound scanning demonstrates  a moderate amount of ascites within the right lower abdominal quadrant. The right lower abdomen was prepped and draped in the usual sterile fashion. 1% lidocaine with epinephrine was used for local anesthesia. An ultrasound image was saved for documentation purposed. An 8 Fr Safe-T-Centesis catheter was introduced. The paracentesis was performed. The catheter was removed and a dressing was applied. The patient tolerated the procedure well without immediate post procedural complication. FINDINGS: A total of approximately 4 liters of yellow, cloudy fluid was removed. IMPRESSION: Successful ultrasound-guided paracentesis yielding 4 liters of peritoneal fluid. Electronically Signed   By: Sandi Mariscal M.D.   On: 09/01/2016 14:11   US Paracentesis  Result Date: 08/24/2016 INDICATION: History of cholangiocarcinoma with recurrent symptomatic ascites. EXAM: ULTRASOUND-GUIDED PARACENTESIS COMPARISON:  Multiple previous ultrasound-guided paracenteses, most recently on 08/11/2016. MEDICATIONS: None. COMPLICATIONS: None immediate. TECHNIQUE: Informed written consent was obtained from the patient after a discussion of the risks, benefits and alternatives to treatment. A timeout was performed prior to the initiation of the procedure. Initial ultrasound scanning demonstrates a moderate to large amount of ascites within the right lower abdominal quadrant. The right lower abdomen was prepped and draped in the usual sterile fashion. 1% lidocaine with epinephrine was used for local anesthesia. An ultrasound image was saved for documentation purposed. An 8 Fr Safe-T-Centesis catheter was introduced. The paracentesis was performed. The catheter was removed and a dressing was applied. The patient tolerated the procedure well without immediate post procedural complication. FINDINGS: A total of approximately 3.4 liters of serous fluid was removed. IMPRESSION: Successful ultrasound-guided paracentesis yielding 3.4 liters of  peritoneal  fluid. Electronically Signed   By: Sandi Mariscal M.D.   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 April 2nd 2018 MRI at [facility] response/slight increase in size of liver lesion; await repeat MRI in 3 months/ Dr.Morse. No chemo recommended  #  Ascites/large volume needing frequent paracentesis [needing almost once every 1-2 weeks]-likely secondary to decompensated liver cirrhosis. Cytology negative multiple times. Finally the patient is interested in the Pleurx catheter. I'll also recommend a Mediport- as patient is likely to get albumin infusions.  # Chronic kidney disease stage III-creatinine 1.7/ worsening.  Discussed this is likely secondary to fluid shifts because of large volume paracentesis. For now continue Lasix.  Pt needing albumin.  # Elevated liver function bilirubin 2.9;  likely secondary to underlying malignancy cirrhosis.   # Unfortunately patient has poor prognosis.   # follow up in 4 weeks/labs.     Cammie Sickle, MD 09/13/2016 4:58 PM

## 2016-09-13 NOTE — Assessment & Plan Note (Addendum)
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 April 2nd 2018 MRI at [facility] response/slight increase in size of liver lesion; await repeat MRI in 3 months/ Dr.Morse. No chemo recommended  #  Ascites/large volume needing frequent paracentesis [needing almost once every 1-2 weeks]-likely secondary to decompensated liver cirrhosis. Cytology negative multiple times. Finally the patient is interested in the Pleurx catheter. I'll also recommend a Mediport- as patient is likely to get albumin infusions.  # Chronic kidney disease stage III-creatinine 1.7/ worsening.  Discussed this is likely secondary to fluid shifts because of large volume paracentesis. For now continue Lasix.  Pt needing albumin.  # Elevated liver function bilirubin 2.9;  likely secondary to underlying malignancy cirrhosis.   # Unfortunately patient has poor prognosis.   # follow up in 4 weeks/labs.

## 2016-09-20 ENCOUNTER — Other Ambulatory Visit: Payer: Self-pay | Admitting: Internal Medicine

## 2016-09-20 ENCOUNTER — Ambulatory Visit
Admission: RE | Admit: 2016-09-20 | Discharge: 2016-09-20 | Disposition: A | Discharge status: Home / Self Care | Payer: 59 | Source: Ambulatory Visit | Attending: Internal Medicine | Admitting: Internal Medicine

## 2016-09-20 DIAGNOSIS — R188 Other ascites: Secondary | ICD-10-CM | POA: Insufficient documentation

## 2016-09-20 DIAGNOSIS — K7460 Unspecified cirrhosis of liver: Secondary | ICD-10-CM

## 2016-09-20 DIAGNOSIS — C221 Intrahepatic bile duct carcinoma: Secondary | ICD-10-CM

## 2016-09-20 MED ORDER — ALBUMIN HUMAN 25 % IV SOLN
25.0000 g | Freq: Once | INTRAVENOUS | Status: AC
  Administered 2016-09-20: 25 g via INTRAVENOUS
  Filled 2016-09-20: qty 100

## 2016-09-20 NOTE — Procedures (Signed)
Pre Procedural Dx: History of cholangiocarcinoma with recurrent symptomatic Ascites Post Procedural Dx: Same  Successful US guided paracentesis yielding 4 L of serous ascitic fluid. Sample sent to laboratory as requested.  EBL: None  Complications: None immediate  Ronny Bacon, MD Pager #: [phone]

## 2016-09-29 ENCOUNTER — Inpatient Hospital Stay: Payer: 59

## 2016-09-29 ENCOUNTER — Telehealth: Payer: Self-pay | Admitting: *Deleted

## 2016-09-29 ENCOUNTER — Ambulatory Visit
Admission: RE | Admit: 2016-09-29 | Discharge: 2016-09-29 | Disposition: A | Discharge status: Home / Self Care | Payer: 59 | Source: Ambulatory Visit | Attending: Internal Medicine | Admitting: Internal Medicine

## 2016-09-29 ENCOUNTER — Other Ambulatory Visit: Payer: Self-pay | Admitting: *Deleted

## 2016-09-29 ENCOUNTER — Inpatient Hospital Stay (HOSPITAL_BASED_OUTPATIENT_CLINIC_OR_DEPARTMENT_OTHER): Payer: 59 | Admitting: Internal Medicine

## 2016-09-29 VITALS — BP 90/52 | HR 78 | Temp 97.6°F | Resp 20 | Ht 66.0 in | Wt 191.0 lb

## 2016-09-29 DIAGNOSIS — N183 Chronic kidney disease, stage 3 (moderate): Secondary | ICD-10-CM | POA: Diagnosis not present

## 2016-09-29 DIAGNOSIS — R140 Abdominal distension (gaseous): Secondary | ICD-10-CM

## 2016-09-29 DIAGNOSIS — C221 Intrahepatic bile duct carcinoma: Secondary | ICD-10-CM | POA: Diagnosis not present

## 2016-09-29 DIAGNOSIS — K7460 Unspecified cirrhosis of liver: Secondary | ICD-10-CM | POA: Diagnosis not present

## 2016-09-29 DIAGNOSIS — K5900 Constipation, unspecified: Secondary | ICD-10-CM | POA: Diagnosis not present

## 2016-09-29 DIAGNOSIS — R978 Other abnormal tumor markers: Secondary | ICD-10-CM | POA: Diagnosis not present

## 2016-09-29 DIAGNOSIS — I129 Hypertensive chronic kidney disease with stage 1 through stage 4 chronic kidney disease, or unspecified chronic kidney disease: Secondary | ICD-10-CM

## 2016-09-29 DIAGNOSIS — Z85528 Personal history of other malignant neoplasm of kidney: Secondary | ICD-10-CM

## 2016-09-29 DIAGNOSIS — R5383 Other fatigue: Secondary | ICD-10-CM

## 2016-09-29 DIAGNOSIS — E119 Type 2 diabetes mellitus without complications: Secondary | ICD-10-CM | POA: Diagnosis not present

## 2016-09-29 DIAGNOSIS — R188 Other ascites: Secondary | ICD-10-CM

## 2016-09-29 DIAGNOSIS — R531 Weakness: Secondary | ICD-10-CM

## 2016-09-29 DIAGNOSIS — R109 Unspecified abdominal pain: Secondary | ICD-10-CM

## 2016-09-29 DIAGNOSIS — K7290 Hepatic failure, unspecified without coma: Secondary | ICD-10-CM | POA: Diagnosis not present

## 2016-09-29 DIAGNOSIS — F05 Delirium due to known physiological condition: Secondary | ICD-10-CM

## 2016-09-29 DIAGNOSIS — Z79899 Other long term (current) drug therapy: Secondary | ICD-10-CM

## 2016-09-29 DIAGNOSIS — M7989 Other specified soft tissue disorders: Secondary | ICD-10-CM

## 2016-09-29 LAB — COMPREHENSIVE METABOLIC PANEL
ALT: 17 U/L (ref 14–54)
AST: 31 U/L (ref 15–41)
Albumin: 2.6 g/dL — ABNORMAL LOW (ref 3.5–5.0)
Alkaline Phosphatase: 105 U/L (ref 38–126)
Anion gap: 8 (ref 5–15)
BUN: 83 mg/dL — ABNORMAL HIGH (ref 6–20)
CO2: 24 mmol/L (ref 22–32)
Calcium: 8.2 mg/dL — ABNORMAL LOW (ref 8.9–10.3)
Chloride: 98 mmol/L — ABNORMAL LOW (ref 101–111)
Creatinine, Ser: 2.35 mg/dL — ABNORMAL HIGH (ref 0.44–1.00)
GFR calc Af Amer: 23 mL/min — ABNORMAL LOW (ref >60)
GFR calc non Af Amer: 20 mL/min — ABNORMAL LOW (ref >60)
Glucose, Bld: 98 mg/dL (ref 65–99)
Potassium: 4.9 mmol/L (ref 3.5–5.1)
Sodium: 130 mmol/L — ABNORMAL LOW (ref 135–145)
Total Bilirubin: 3.3 mg/dL — ABNORMAL HIGH (ref 0.3–1.2)
Total Protein: 5.6 g/dL — ABNORMAL LOW (ref 6.5–8.1)

## 2016-09-29 LAB — CBC WITH DIFFERENTIAL/PLATELET
BASOS ABS: 0 10*3/uL (ref 0–0.1)
BASOS PCT: 0 %
EOS ABS: 0.1 10*3/uL (ref 0–0.7)
Eosinophils Relative: 1 %
HCT: 34 % — ABNORMAL LOW (ref 35.0–47.0)
HEMOGLOBIN: 11.6 g/dL — AB (ref 12.0–16.0)
Lymphocytes Relative: 11 %
Lymphs Abs: 1 10*3/uL (ref 1.0–3.6)
MCH: 30.9 pg (ref 26.0–34.0)
MCHC: 34.2 g/dL (ref 32.0–36.0)
MCV: 90.4 fL (ref 80.0–100.0)
MONOS PCT: 15 %
Monocytes Absolute: 1.5 10*3/uL — ABNORMAL HIGH (ref 0.2–0.9)
NEUTROS PCT: 73 %
Neutro Abs: 7.2 10*3/uL — ABNORMAL HIGH (ref 1.4–6.5)
Platelets: 204 10*3/uL (ref 150–440)
RBC: 3.76 MIL/uL — AB (ref 3.80–5.20)
RDW: 16.5 % — ABNORMAL HIGH (ref 11.5–14.5)
WBC: 9.8 10*3/uL (ref 3.6–11.0)

## 2016-09-29 LAB — SAMPLE TO BLOOD BANK

## 2016-09-29 LAB — AMMONIA: Ammonia: 103 umol/L — ABNORMAL HIGH (ref 9–35)

## 2016-09-29 MED ORDER — LACTULOSE ENCEPHALOPATHY 10 GM/15ML PO SOLN: 30.0000 g | Freq: Two times a day (BID) | ORAL | 0 refills | Status: DC

## 2016-09-29 MED ORDER — ALBUMIN HUMAN 25 % IV SOLN
25.0000 g | Freq: Once | INTRAVENOUS | Status: AC
  Administered 2016-09-29: 25 g via INTRAVENOUS
  Filled 2016-09-29: qty 100

## 2016-09-29 NOTE — Telephone Encounter (Signed)
Spoke with Dr. Rogue Bussing- recommended the patient have a stat paracentesis today. Labs prior to procedure- cbc, metc, ammonia level. Pt will need to return to clinic s/p paracentesis. standing Orders on chart for paracentesis. Labs orders entered. Spoke with daughter-at 10 am unable to bring patient immediately as her daughter is currently at work. Daughter wanted to know if this concern could wait until late this afternoon or early Monday. Daughter voiced concerns of pt's safety -daughter explained that pt is unable to drive to the apt due to the increase level of confusion. Explained to daughter that if she could not get pt to the apts today, then she would need to have EMS transport pt to ED.   Daughter called back at 64 and states that she would have her at cancer center no later than 12 noon for labs.  Still waiting to hear back from centralized scheduling on paracentesis time.

## 2016-09-29 NOTE — Telephone Encounter (Signed)
Vomiting Saturday, called Dr Leamon Arnt who ordered Zofran which improved, but now she has a sharp decline in condition, confusion, not eating, possibly dehydrated. Thinks she needs to be evaluated as soon as possible. Patient states she feels full, last paracentesis was 09/20/16. Please advise.

## 2016-09-29 NOTE — Telephone Encounter (Signed)
-----   Message from Elouise Munroe sent at 09/29/2016  9:54 AM EDT ----- Contact: [phone] Daughter-[name] Wilson called about her not feeling well..[birth year] we need to see her sonner than 10-13-16

## 2016-09-29 NOTE — Assessment & Plan Note (Addendum)
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 April 2nd 2018 MRI at [facility] response/slight increase in size of liver lesion; awaiting  A repeat MRI in approximately 3 months.  However clinical progression  Suspected [ C discussion below].  Patient not a candidate for chemotherapy at this time.  #  Recurrent  Large volume ascites-  The patient had  Paracentesis/albumin this afternoon-with symptomatic  relief.  She continues to decline Pleurx catheter placement.  # Chronic kidney disease stage III-creatinine Baselne 1.7/ worsening-  Today creatinine is 2.3.  Discussed this is likely secondary to fluid shifts because of large volume paracentesis;  And  Intravascular volume depletion/ with third spacing.  Also discussed regarding referral  To nephrology;  Patient/family decline.  # Elevated liver function bilirubin 3.2/ worsening;  likely secondary to underlying malignancy cirrhosis.   # I had a long discussion the patient and family regarding- significant decline in the performance status /organ dysfunction - likely secondary to progressive cirrhosis /malignancy. She is a poor candidate for chemotherapy; especially given the poor response rates; borderline performance status; multiorgan dysfunction- I recommend palliative care evaluation//hospice. Patient family did not really for hospice yet.  However they are interested in palliative care evaluation. Recommend home health given the difficulty with her daily chores.   # Unfortunately patient has poor prognosis; Also discussed DNR/DNI. No decisions made.   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 Recommend lactulose. Prescription given.  # patient/family will follow-up with me as planned on May 11 with labs.   # 40 minutes face-to-face with the patient discussing the above plan of care; more  than 50% of time spent on prognosis/ natural history; counseling and coordination.

## 2016-09-29 NOTE — Progress Notes (Signed)
Patient here to clinic to follow-up with Dr. Rogue Bussing s/p paracenteiis. Pt very weak-unable to stand for prolonged periods of time. Pt reports intermittent Constipation. Has not used any laxatives. Daughter voices concerns about creatinine levels increasing.

## 2016-09-29 NOTE — Telephone Encounter (Signed)
Arrive for Paracentesis at 1pm. Daughter made aware.

## 2016-09-29 NOTE — Progress Notes (Signed)
[name]  Patient Care Team: Crecencio Mc, MD as PCP - General (Internal Medicine) Robert Bellow, MD as Consulting Physician (General Surgery)  CHIEF COMPLAINTS/PURPOSE OF CONSULTATION:   Oncology History   # July-AUG [phone] x 10 cm lesion in the liver; MRI- enhancing/ left portal vein ;PET- uptake limited to liver. Ca 125- 1500; APF- N; Ca- 19-9-N.   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  female with Cholangiocarcinoma intrahepatic status post y90  Approximately 6 months; also refractory ascites needing paracentesis every  Week-  Is  Here accompanied by  Daughter/  Family  Given the general decline in overall health.   as per the family  She had  Episode of " explosive "  Nausea vomiting- that lasted for a day or so  Approximately  4-5 days ago which was attributed to " sugar/ ice cream"  On her  Rudene Anda.  No diarrhea.  Improved after taking Zofran.   As per the family she has been feeling more weak.  Her mobility is more  Limited now.  She is retired from work.  She noted to have  Abdominal distention worsened with the last one week or so.    Complains of significant fatigue. Patient continues to have swelling in the legs; and abdominal distention. As per the family she is intermittently confused.  No fever no chills. Positive for constipation.  ROS: A complete 10 point review of system is done which is negative except mentioned above in history of present illness  MEDICAL HISTORY:  Past Medical History:   Diagnosis Date  . Ascites 01/09/2006   Ascites - 3L drawn off on 01/04/2016  . Breast lump in female 2014  . Cancer of kidney ([place])   . Diabetes mellitus without complication ([place])   . Elevated blood pressure reading   . Hypertension   . Liver mass 01/10/2016   Per patient; bloating.  Mass found via [place], CT, and MRI.     SURGICAL HISTORY: Past Surgical History:  Procedure Laterality Date  . BREAST BIOPSY  1996  . BREAST BIOPSY  2014   Calcifications - [place]  . BREAST LUMPECTOMY    . SHOULDER SURGERY  2011   right , [place]     SOCIAL HISTORY: no smoking/ alcohol.Gillermina Hu.. Self..  Social History   Social History  . Marital status: Divorced    Spouse name: N/A  . Number of children: N/A  . Years of education: N/A   Occupational History  . Not on file.   Social History Main Topics  . Smoking status: Never Smoker  . Smokeless tobacco: Never Used  . Alcohol use No  . Drug use: No  . Sexual activity: Not on file   Other Topics Concern  . Not on file   Social History Narrative  . No narrative on file    FAMILY HISTORY:  Family History  Problem Relation Age of Onset  . Cancer Mother 65  Breast Cancer  . Breast cancer Mother   . Asthma Father   . Diabetes Father     ALLERGIES:  is allergic to adhesive [tape].  MEDICATIONS:  Current Outpatient Prescriptions  Medication Sig Dispense Refill  . furosemide (LASIX) 20 MG tablet TAKE 2 TABLETS BY MOUTH DAILY (Patient taking differently: TAKE 2 TABLETS BY MOUTH TWICE DAILY) 60 tablet 3  . ondansetron (ZOFRAN) 8 MG tablet Take 1 tablet by mouth every 8 (eight) hours as needed for nausea/vomiting.    . ergocalciferol (VITAMIN D2) 50000 units capsule Take 1 capsule (50,000 Units total) by mouth once a week. (Patient not taking: Reported on 09/29/2016) 12 capsule 1  . fluticasone (FLONASE) 50 MCG/ACT nasal spray Place 2 sprays into both nostrils daily. (Patient not taking: Reported on 09/29/2016) 16 g 6  . lactulose,  encephalopathy, (CHRONULAC) 10 GM/15ML SOLN Take 45 mLs (30 g total) by mouth 2 (two) times daily. 500 mL 0   No current facility-administered medications for this visit.       Marland Kitchen  PHYSICAL EXAMINATION: ECOG PERFORMANCE STATUS: 1 - Symptomatic but completely ambulatory  Vitals:   09/29/16 1538  BP: (!) 90/52  Pulse: 78  Resp: 20  Temp: 97.6 F (36.4 C)   Filed Weights   09/29/16 1538  Weight: 191 lb (86.6 kg)    GENERAL: Chronically sick-appearing female patient. Alert, no distress and comfortable.  She is  Accompanied by daughter/  Family. She is in a wheelchair. Temporal wasting noted. EYES: no pallor; positive for mild icterus. OROPHARYNX: no thrush or ulceration; good dentition  NECK: supple, no masses felt LYMPH:  no palpable lymphadenopathy in the cervical, axillary or inguinal regions LUNGS: Decreased breath sounds to auscultation left lower base and  No wheeze or crackles HEART/CVS: regular rate & rhythm and no murmurs; positive for 2 plus lower extremity edema ABDOMEN: abdomen soft, non-tender and normal bowel sounds; positive for distention. Positive for hepatomegaly and splenomegaly. Positive for ascites. Musculoskeletal:no cyanosis of digits and no clubbing  PSYCH: alert & oriented x 3 with fluent speech NEURO: no focal motor/sensory deficits SKIN:  Multiple bruises noted over the skin.  LABORATORY DATA:  I have reviewed the data as listed Lab Results  Component Value Date   WBC 9.8 09/29/2016   HGB 11.6 (L) 09/29/2016   HCT 34.0 (L) 09/29/2016   MCV 90.4 09/29/2016   PLT 204 09/29/2016    Recent Labs  12/15/15 1159  08/17/16 1310  08/31/16 1652 09/13/16 1405 09/29/16 1218  NA  --   < > 129*  < > 132* 131* 130*  K  --   < > 4.6  < > 5.2* 4.8 4.9  CL  --   < > 98*  < > 100* 100* 98*  CO2  --   < > 25  < > 24 23 24   GLUCOSE  --   < > 128*  < > 175* 159* 98  BUN  --   < > 50*  < > 55* 67* 83*  CREATININE  --   < > 1.45*  < > 1.58* 1.74* 2.35*   CALCIUM  --   < > 8.4*  < > 8.7* 9.0 8.2*  GFRNONAA  --   < > 35*  < > 32* 28* 20*  GFRAA  --   < > 41*  < > 37* 33* 23*  PROT 6.3  < > 5.5*  --   --  5.9* 5.6*  ALBUMIN 3.3*  < >  2.7*  --   --  2.9* 2.6*  AST 26  < > 37  --   --  34 31  ALT 10  < > 21  --   --  19 17  ALKPHOS 81  < > 84  --   --  80 105  BILITOT 1.5*  < > 2.2*  --   --  2.9* 3.3*  BILIDIR 0.5*  --   --   --   --   --   --   < > = values in this interval not displayed.  RADIOGRAPHIC STUDIES: I have personally reviewed the radiological images as listed and agreed with the findings in the report. 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 EXAM: ULTRASOUND GUIDED PARACENTESIS TECHNIQUE: The procedure, risks (including but not limited to bleeding, infection, organ damage ), benefits, and alternatives were explained to the patient. Questions regarding the procedure were encouraged and answered. The patient understands and consents to the procedure. Survey ultrasound of the abdomen was performed and an appropriate skin entry site in the left lower abdomen was selected. Skin site was marked, prepped with chlorhexadine, draped in usual sterile fashion, and infiltrated locally with 1% lidocaine. A Safe-T-Centesis needle was advanced into the peritoneal space until fluid could be aspirated. The sheath was advanced and the needle removed. 4 L Of cloudy yellow ascites were aspirated. The patient tolerated the procedure well. Postprocedure scans show no significant residual fluid. COMPLICATIONS: COMPLICATIONS none IMPRESSION: Technically successful ultrasound guided paracentesis, removing 4 L ascites. Electronically Signed   By: Lucrezia Europe M.D.   On: 09/29/2016 15:11   US Paracentesis  Result Date: 09/20/2016 INDICATION: History of cholangiocarcinoma, now with recurrent symptomatic ascites. Please perform ultrasound-guided paracentesis for therapeutic purposes  (paracentesis max is 4 L). EXAM: ULTRASOUND-GUIDED PARACENTESIS COMPARISON:  Ultrasound-guided paracentesis - 09/06/2016; 09/01/2016; 08/24/2016 MEDICATIONS: None. COMPLICATIONS: None immediate. TECHNIQUE: Informed written consent was obtained from the patient after a discussion of the risks, benefits and alternatives to treatment. A timeout was performed prior to the initiation of the procedure. Initial ultrasound scanning demonstrates a moderate to large amount of ascites within the right lower abdominal quadrant. The right lower abdomen was prepped and draped in the usual sterile fashion. 1% lidocaine with epinephrine was used for local anesthesia. An ultrasound image was saved for documentation purposed. An 8 Fr Safe-T-Centesis catheter was introduced. The paracentesis was performed. The catheter was removed and a dressing was applied. The patient tolerated the procedure well without immediate post procedural complication. FINDINGS: A total of approximately 4 liters of serous fluid was removed. IMPRESSION: Successful ultrasound-guided paracentesis yielding 4 liters of peritoneal fluid. Electronically Signed   By: Sandi Mariscal M.D.   On: 09/20/2016 15:30   US Paracentesis  Result Date: 09/06/2016 INDICATION: Recurrent ascites.  Cholangiocarcinoma. EXAM: ULTRASOUND GUIDED PARACENTESIS MEDICATIONS: None. COMPLICATIONS: None immediate. PROCEDURE: Informed written consent was obtained from the patient after a discussion of the risks, benefits and alternatives to treatment. A timeout was performed prior to the initiation of the procedure. Initial ultrasound scanning demonstrates a large amount of ascites within the left lower abdominal quadrant. The left lower abdomen was prepped and draped in the usual sterile fashion. 1% lidocaine was used for local anesthesia. Following this, a 6 Fr Safe-T-Centesis catheter was introduced. An ultrasound image was saved for documentation purposes. The paracentesis was performed.  The catheter was removed and a dressing was applied. The patient tolerated  the procedure well without immediate post procedural complication. FINDINGS: A total of approximately 3.8 L of milky yellow fluid was removed. IMPRESSION: Successful ultrasound-guided paracentesis yielding 3.8 liters of peritoneal fluid. Electronically Signed   By: Markus Daft M.D.   On: 09/06/2016 18:37   US Paracentesis  Result Date: 09/01/2016 INDICATION: History cholangiocarcinoma, now with recurrent symptomatic ascites. EXAM: ULTRASOUND-GUIDED PARACENTESIS COMPARISON:  Multiple previous ultrasound-guided paracenteses, most recently on 08/24/2016 MEDICATIONS: None. COMPLICATIONS: None immediate. TECHNIQUE: Informed written consent was obtained from the patient after a discussion of the risks, benefits and alternatives to treatment. A timeout was performed prior to the initiation of the procedure. Initial ultrasound scanning demonstrates a moderate amount of ascites within the right lower abdominal quadrant. The right lower abdomen was prepped and draped in the usual sterile fashion. 1% lidocaine with epinephrine was used for local anesthesia. An ultrasound image was saved for documentation purposed. An 8 Fr Safe-T-Centesis catheter was introduced. The paracentesis was performed. The catheter was removed and a dressing was applied. The patient tolerated the procedure well without immediate post procedural complication. FINDINGS: A total of approximately 4 liters of yellow, cloudy fluid was removed. IMPRESSION: Successful ultrasound-guided paracentesis yielding 4 liters of peritoneal fluid. Electronically Signed   By: Sandi Mariscal M.D.   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 April 2nd 2018 MRI at [facility] response/slight increase in size of  liver lesion; awaiting  A repeat MRI in approximately 3 months.  However clinical progression  Suspected [ C discussion below].  Patient not a candidate for chemotherapy at this time.  #  Recurrent  Large volume ascites-  The patient had  Paracentesis/albumin this afternoon-with symptomatic  relief.  She continues to decline Pleurx catheter placement.  # Chronic kidney disease stage III-creatinine Baselne 1.7/ worsening-  Today creatinine is 2.3.  Discussed this is likely secondary to fluid shifts because of large volume paracentesis;  And  Intravascular volume depletion/ with third spacing.  Also discussed regarding referral  To nephrology;  Patient/family decline.  # Elevated liver function bilirubin 3.2/ worsening;  likely secondary to underlying malignancy cirrhosis.   # I had a long discussion the patient and family regarding- significant decline in the performance status /organ dysfunction - likely secondary to progressive cirrhosis /malignancy. She is a poor candidate for chemotherapy; especially given the poor response rates; borderline performance status; multiorgan dysfunction- I recommend palliative care evaluation//hospice. Patient family did not really for hospice yet.  However they are interested in palliative care evaluation. Recommend home health given the difficulty with her daily chores.   # Unfortunately patient has poor prognosis; Also discussed DNR/DNI. No decisions made.   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 Recommend lactulose. Prescription given.  # patient/family will follow-up with me as planned on May 11 with labs.   # 40 minutes face-to-face with the patient discussing the above plan of care; more than 50% of time spent on prognosis/ natural history; counseling and coordination.    Cammie Sickle, MD 09/30/2016 9:41 AM

## 2016-10-01 ENCOUNTER — Inpatient Hospital Stay
Admission: EM | Admit: 2016-10-01 | Discharge: 2016-10-03 | DRG: 871 | Disposition: A | Discharge status: Hospice | Payer: 59 | Attending: Internal Medicine | Admitting: Internal Medicine

## 2016-10-01 ENCOUNTER — Emergency Department: Payer: 59

## 2016-10-01 ENCOUNTER — Other Ambulatory Visit: Payer: Self-pay

## 2016-10-01 ENCOUNTER — Encounter: Payer: Self-pay | Admitting: Emergency Medicine

## 2016-10-01 DIAGNOSIS — S299XXA Unspecified injury of thorax, initial encounter: Secondary | ICD-10-CM | POA: Diagnosis not present

## 2016-10-01 DIAGNOSIS — K7290 Hepatic failure, unspecified without coma: Secondary | ICD-10-CM | POA: Diagnosis present

## 2016-10-01 DIAGNOSIS — N179 Acute kidney failure, unspecified: Secondary | ICD-10-CM | POA: Diagnosis not present

## 2016-10-01 DIAGNOSIS — K767 Hepatorenal syndrome: Secondary | ICD-10-CM | POA: Diagnosis present

## 2016-10-01 DIAGNOSIS — I129 Hypertensive chronic kidney disease with stage 1 through stage 4 chronic kidney disease, or unspecified chronic kidney disease: Secondary | ICD-10-CM | POA: Diagnosis present

## 2016-10-01 DIAGNOSIS — J9690 Respiratory failure, unspecified, unspecified whether with hypoxia or hypercapnia: Secondary | ICD-10-CM

## 2016-10-01 DIAGNOSIS — K7682 Hepatic encephalopathy: Secondary | ICD-10-CM

## 2016-10-01 DIAGNOSIS — E119 Type 2 diabetes mellitus without complications: Secondary | ICD-10-CM | POA: Diagnosis not present

## 2016-10-01 DIAGNOSIS — M79602 Pain in left arm: Secondary | ICD-10-CM | POA: Diagnosis not present

## 2016-10-01 DIAGNOSIS — T68XXXA Hypothermia, initial encounter: Secondary | ICD-10-CM | POA: Diagnosis not present

## 2016-10-01 DIAGNOSIS — C221 Intrahepatic bile duct carcinoma: Secondary | ICD-10-CM | POA: Diagnosis not present

## 2016-10-01 DIAGNOSIS — J181 Lobar pneumonia, unspecified organism: Secondary | ICD-10-CM

## 2016-10-01 DIAGNOSIS — W19XXXA Unspecified fall, initial encounter: Secondary | ICD-10-CM | POA: Diagnosis not present

## 2016-10-01 DIAGNOSIS — E1122 Type 2 diabetes mellitus with diabetic chronic kidney disease: Secondary | ICD-10-CM | POA: Diagnosis present

## 2016-10-01 DIAGNOSIS — Z79899 Other long term (current) drug therapy: Secondary | ICD-10-CM

## 2016-10-01 DIAGNOSIS — S0990XA Unspecified injury of head, initial encounter: Secondary | ICD-10-CM | POA: Diagnosis not present

## 2016-10-01 DIAGNOSIS — E8809 Other disorders of plasma-protein metabolism, not elsewhere classified: Secondary | ICD-10-CM | POA: Diagnosis present

## 2016-10-01 DIAGNOSIS — R627 Adult failure to thrive: Secondary | ICD-10-CM | POA: Diagnosis present

## 2016-10-01 DIAGNOSIS — R630 Anorexia: Secondary | ICD-10-CM | POA: Diagnosis not present

## 2016-10-01 DIAGNOSIS — Z66 Do not resuscitate: Secondary | ICD-10-CM | POA: Diagnosis present

## 2016-10-01 DIAGNOSIS — F05 Delirium due to known physiological condition: Secondary | ICD-10-CM | POA: Diagnosis not present

## 2016-10-01 DIAGNOSIS — K7460 Unspecified cirrhosis of liver: Secondary | ICD-10-CM | POA: Diagnosis present

## 2016-10-01 DIAGNOSIS — A419 Sepsis, unspecified organism: Secondary | ICD-10-CM | POA: Diagnosis not present

## 2016-10-01 DIAGNOSIS — J189 Pneumonia, unspecified organism: Secondary | ICD-10-CM

## 2016-10-01 DIAGNOSIS — K7469 Other cirrhosis of liver: Secondary | ICD-10-CM | POA: Diagnosis not present

## 2016-10-01 DIAGNOSIS — E871 Hypo-osmolality and hyponatremia: Secondary | ICD-10-CM | POA: Diagnosis present

## 2016-10-01 DIAGNOSIS — R6889 Other general symptoms and signs: Secondary | ICD-10-CM | POA: Diagnosis not present

## 2016-10-01 DIAGNOSIS — I959 Hypotension, unspecified: Secondary | ICD-10-CM | POA: Diagnosis not present

## 2016-10-01 DIAGNOSIS — N189 Chronic kidney disease, unspecified: Secondary | ICD-10-CM | POA: Diagnosis present

## 2016-10-01 DIAGNOSIS — R188 Other ascites: Secondary | ICD-10-CM | POA: Diagnosis present

## 2016-10-01 DIAGNOSIS — Z7189 Other specified counseling: Secondary | ICD-10-CM | POA: Diagnosis not present

## 2016-10-01 DIAGNOSIS — E1151 Type 2 diabetes mellitus with diabetic peripheral angiopathy without gangrene: Secondary | ICD-10-CM | POA: Diagnosis present

## 2016-10-01 DIAGNOSIS — Z515 Encounter for palliative care: Secondary | ICD-10-CM | POA: Diagnosis not present

## 2016-10-01 DIAGNOSIS — R944 Abnormal results of kidney function studies: Secondary | ICD-10-CM | POA: Diagnosis not present

## 2016-10-01 DIAGNOSIS — R410 Disorientation, unspecified: Secondary | ICD-10-CM | POA: Diagnosis not present

## 2016-10-01 DIAGNOSIS — I10 Essential (primary) hypertension: Secondary | ICD-10-CM | POA: Diagnosis not present

## 2016-10-01 DIAGNOSIS — S199XXA Unspecified injury of neck, initial encounter: Secondary | ICD-10-CM | POA: Diagnosis not present

## 2016-10-01 DIAGNOSIS — D631 Anemia in chronic kidney disease: Secondary | ICD-10-CM | POA: Diagnosis not present

## 2016-10-01 DIAGNOSIS — Z85528 Personal history of other malignant neoplasm of kidney: Secondary | ICD-10-CM

## 2016-10-01 DIAGNOSIS — N183 Chronic kidney disease, stage 3 (moderate): Secondary | ICD-10-CM | POA: Diagnosis not present

## 2016-10-01 DIAGNOSIS — Z7401 Bed confinement status: Secondary | ICD-10-CM | POA: Diagnosis not present

## 2016-10-01 LAB — CBC
HEMATOCRIT: 37.8 % (ref 35.0–47.0)
Hemoglobin: 12.8 g/dL (ref 12.0–16.0)
MCH: 31.1 pg (ref 26.0–34.0)
MCHC: 33.9 g/dL (ref 32.0–36.0)
MCV: 91.9 fL (ref 80.0–100.0)
Platelets: 225 10*3/uL (ref 150–440)
RBC: 4.11 MIL/uL (ref 3.80–5.20)
RDW: 16.5 % — ABNORMAL HIGH (ref 11.5–14.5)
WBC: 17.3 10*3/uL — ABNORMAL HIGH (ref 3.6–11.0)

## 2016-10-01 LAB — BASIC METABOLIC PANEL
ANION GAP: 9 (ref 5–15)
BUN: 82 mg/dL — ABNORMAL HIGH (ref 6–20)
CO2: 20 mmol/L — ABNORMAL LOW (ref 22–32)
Calcium: 7.6 mg/dL — ABNORMAL LOW (ref 8.9–10.3)
Chloride: 105 mmol/L (ref 101–111)
Creatinine, Ser: 2.12 mg/dL — ABNORMAL HIGH (ref 0.44–1.00)
GFR calc Af Amer: 26 mL/min — ABNORMAL LOW (ref >60)
GFR, EST NON AFRICAN AMERICAN: 22 mL/min — AB (ref >60)
GLUCOSE: 136 mg/dL — AB (ref 65–99)
POTASSIUM: 4.2 mmol/L (ref 3.5–5.1)
Sodium: 134 mmol/L — ABNORMAL LOW (ref 135–145)

## 2016-10-01 LAB — COMPREHENSIVE METABOLIC PANEL
ALBUMIN: 3 g/dL — AB (ref 3.5–5.0)
ALT: 18 U/L (ref 14–54)
ANION GAP: 15 (ref 5–15)
AST: 40 U/L (ref 15–41)
Alkaline Phosphatase: 93 U/L (ref 38–126)
BILIRUBIN TOTAL: 3.3 mg/dL — AB (ref 0.3–1.2)
BUN: 87 mg/dL — ABNORMAL HIGH (ref 6–20)
CALCIUM: 8.4 mg/dL — AB (ref 8.9–10.3)
CO2: 22 mmol/L (ref 22–32)
Chloride: 98 mmol/L — ABNORMAL LOW (ref 101–111)
Creatinine, Ser: 2.29 mg/dL — ABNORMAL HIGH (ref 0.44–1.00)
GFR calc non Af Amer: 20 mL/min — ABNORMAL LOW (ref >60)
GFR, EST AFRICAN AMERICAN: 23 mL/min — AB (ref >60)
GLUCOSE: 128 mg/dL — AB (ref 65–99)
POTASSIUM: 4.4 mmol/L (ref 3.5–5.1)
Sodium: 135 mmol/L (ref 135–145)
TOTAL PROTEIN: 6.1 g/dL — AB (ref 6.5–8.1)

## 2016-10-01 LAB — BRAIN NATRIURETIC PEPTIDE: B NATRIURETIC PEPTIDE 5: 60 pg/mL (ref 0.0–100.0)

## 2016-10-01 LAB — GLUCOSE, CAPILLARY
GLUCOSE-CAPILLARY: 109 mg/dL — AB (ref 65–99)
Glucose-Capillary: 130 mg/dL — ABNORMAL HIGH (ref 65–99)
Glucose-Capillary: 127 mg/dL — ABNORMAL HIGH (ref 65–99)

## 2016-10-01 LAB — URINALYSIS, COMPLETE (UACMP) WITH MICROSCOPIC
Bacteria, UA: NONE SEEN
Bilirubin Urine: NEGATIVE
GLUCOSE, UA: NEGATIVE mg/dL
Hgb urine dipstick: NEGATIVE
Ketones, ur: NEGATIVE mg/dL
Leukocytes, UA: NEGATIVE
Nitrite: NEGATIVE
PH: 5 (ref 5.0–8.0)
Protein, ur: NEGATIVE mg/dL
SPECIFIC GRAVITY, URINE: 1.013 (ref 1.005–1.030)

## 2016-10-01 LAB — LACTIC ACID, PLASMA
LACTIC ACID, VENOUS: 3.4 mmol/L — AB (ref 0.5–1.9)
Lactic Acid, Venous: 2 mmol/L (ref 0.5–1.9)

## 2016-10-01 LAB — TROPONIN I

## 2016-10-01 LAB — PROCALCITONIN: Procalcitonin: 0.9 ng/mL

## 2016-10-01 LAB — PROTIME-INR
INR: 1.3
PROTHROMBIN TIME: 16.3 s — AB (ref 11.4–15.2)

## 2016-10-01 LAB — MRSA PCR SCREENING: MRSA by PCR: NEGATIVE

## 2016-10-01 LAB — MAGNESIUM: MAGNESIUM: 2.6 mg/dL — AB (ref 1.7–2.4)

## 2016-10-01 LAB — AMMONIA: Ammonia: 109 umol/L — ABNORMAL HIGH (ref 9–35)

## 2016-10-01 LAB — PHOSPHORUS: PHOSPHORUS: 4.5 mg/dL (ref 2.5–4.6)

## 2016-10-01 MED ORDER — ACETAMINOPHEN 325 MG PO TABS: 650.0000 mg | ORAL_TABLET | Freq: Four times a day (QID) | ORAL | Status: DC | PRN

## 2016-10-01 MED ORDER — FUROSEMIDE 40 MG PO TABS: 40.0000 mg | ORAL_TABLET | Freq: Every day | ORAL | Status: DC

## 2016-10-01 MED ORDER — SODIUM CHLORIDE 0.9% FLUSH
3.0000 mL | Freq: Two times a day (BID) | INTRAVENOUS | Status: DC
  Administered 2016-10-01 – 2016-10-02 (×3): 3 mL via INTRAVENOUS

## 2016-10-01 MED ORDER — ERGOCALCIFEROL 1.25 MG (50000 UT) PO CAPS: 50000.0000 [IU] | ORAL_CAPSULE | ORAL | Status: DC

## 2016-10-01 MED ORDER — PANTOPRAZOLE SODIUM 40 MG IV SOLR
40.0000 mg | Freq: Two times a day (BID) | INTRAVENOUS | Status: DC
  Administered 2016-10-01 – 2016-10-02 (×4): 40 mg via INTRAVENOUS
  Filled 2016-10-01 (×4): qty 40

## 2016-10-01 MED ORDER — ONDANSETRON HCL 4 MG PO TABS: 4.0000 mg | ORAL_TABLET | Freq: Four times a day (QID) | ORAL | Status: DC | PRN

## 2016-10-01 MED ORDER — SODIUM CHLORIDE 0.9 % IV BOLUS (SEPSIS)
1500.0000 mL | Freq: Once | INTRAVENOUS | Status: AC
  Administered 2016-10-01: 1500 mL via INTRAVENOUS

## 2016-10-01 MED ORDER — LACTULOSE 10 GM/15ML PO SOLN
30.0000 g | Freq: Two times a day (BID) | ORAL | Status: DC
  Administered 2016-10-01 – 2016-10-02 (×2): 30 g via ORAL
  Filled 2016-10-01 (×2): qty 60

## 2016-10-01 MED ORDER — IPRATROPIUM-ALBUTEROL 0.5-2.5 (3) MG/3ML IN SOLN
3.0000 mL | Freq: Four times a day (QID) | RESPIRATORY_TRACT | Status: DC
  Administered 2016-10-01 – 2016-10-02 (×3): 3 mL via RESPIRATORY_TRACT
  Filled 2016-10-01 (×3): qty 3

## 2016-10-01 MED ORDER — DOCUSATE SODIUM 100 MG PO CAPS
100.0000 mg | ORAL_CAPSULE | Freq: Two times a day (BID) | ORAL | Status: DC
  Administered 2016-10-01 (×2): 100 mg via ORAL
  Filled 2016-10-01 (×3): qty 1

## 2016-10-01 MED ORDER — SODIUM CHLORIDE 0.9 % IV SOLN
INTRAVENOUS | Status: DC
  Administered 2016-10-01 – 2016-10-02 (×3): via INTRAVENOUS

## 2016-10-01 MED ORDER — SODIUM CHLORIDE 0.9 % IV BOLUS (SEPSIS)
1000.0000 mL | Freq: Once | INTRAVENOUS | Status: AC
  Administered 2016-10-01: 1000 mL via INTRAVENOUS

## 2016-10-01 MED ORDER — ONDANSETRON HCL 4 MG/2ML IJ SOLN
4.0000 mg | Freq: Four times a day (QID) | INTRAMUSCULAR | Status: DC | PRN
  Administered 2016-10-01: 4 mg via INTRAVENOUS
  Filled 2016-10-01: qty 2

## 2016-10-01 MED ORDER — BISACODYL 10 MG RE SUPP: 10.0000 mg | Freq: Every day | RECTAL | Status: DC | PRN

## 2016-10-01 MED ORDER — VANCOMYCIN HCL IN DEXTROSE 1-5 GM/200ML-% IV SOLN
1000.0000 mg | INTRAVENOUS | Status: DC
  Administered 2016-10-02: 1000 mg via INTRAVENOUS
  Filled 2016-10-01: qty 200

## 2016-10-01 MED ORDER — VANCOMYCIN HCL IN DEXTROSE 1-5 GM/200ML-% IV SOLN
1000.0000 mg | Freq: Once | INTRAVENOUS | Status: AC
  Administered 2016-10-01: 1000 mg via INTRAVENOUS
  Filled 2016-10-01: qty 200

## 2016-10-01 MED ORDER — SODIUM CHLORIDE 0.9 % IV BOLUS (SEPSIS): 1000.0000 mL | Freq: Once | INTRAVENOUS | Status: DC

## 2016-10-01 MED ORDER — INSULIN ASPART 100 UNIT/ML ~~LOC~~ SOLN
0.0000 [IU] | Freq: Three times a day (TID) | SUBCUTANEOUS | Status: DC
  Administered 2016-10-01: 1 [IU] via SUBCUTANEOUS
  Filled 2016-10-01: qty 1

## 2016-10-01 MED ORDER — ACETAMINOPHEN 650 MG RE SUPP: 650.0000 mg | Freq: Four times a day (QID) | RECTAL | Status: DC | PRN

## 2016-10-01 MED ORDER — CEFEPIME-DEXTROSE 1 GM/50ML IV SOLR
1.0000 g | Freq: Once | INTRAVENOUS | Status: AC
  Administered 2016-10-01: 1 g via INTRAVENOUS
  Filled 2016-10-01: qty 50

## 2016-10-01 MED ORDER — PIPERACILLIN-TAZOBACTAM 3.375 G IVPB
3.3750 g | Freq: Three times a day (TID) | INTRAVENOUS | Status: DC
  Administered 2016-10-01 – 2016-10-02 (×2): 3.375 g via INTRAVENOUS
  Filled 2016-10-01 (×5): qty 50

## 2016-10-01 MED ORDER — DEXTROSE 5 % IV SOLN
500.0000 mg | Freq: Once | INTRAVENOUS | Status: AC
  Administered 2016-10-01: 500 mg via INTRAVENOUS
  Filled 2016-10-01: qty 500

## 2016-10-01 NOTE — ED Triage Notes (Signed)
Pt brought in by [facility] from home. Pt has hx.o liver cancer. Per EMS pt fell this morning. Pt does not remember falling. Pt family at bedside and states that pt has had increased confusion. Confusion has been an ongoing issue but has gotten worse over the last 24-48 hours. Pt denies pain any where. Pt does have skin tear to left forearm but this happened when EMS was attempting to get her out of the floor. Pt has bruising noted to the left arm as well. Pt A & O x 4.

## 2016-10-01 NOTE — Progress Notes (Signed)
Pharmacist Note  Spoke with MD. Reita Cliche about code sepsis and no antibiotics ordered. MD does not want antibiotics at this time as they do not think it is a code sepsis at this time.   Loree Fee, PharmD 11:18 AM 10/01/2016

## 2016-10-01 NOTE — Progress Notes (Signed)
Patient having blood pressure readings systolic in 83'A with MAP's trending 60. Called E-Link post 1.5L bolus completion, with vital sign findings. Patient is alert and orientated x 4. Resting comfortably in bed, asymptomatic. E-Link RN spoke with E-Link MD. Via telephone E-Link RN responded to me and verbally notified me that MD was comfortable with BP MAP trending 60. Will closely monitor and assess the patient. Family at bedside.

## 2016-10-01 NOTE — Progress Notes (Signed)
eLink Physician-Brief Progress Note Patient Name: Jennifer Ruiz DOB: 02/28/1946 MRN: [id number]   Date of Service  10/01/2016  HPI/Events of Note  Presumed sepsis ? Lt base chronic infx vs other source Confusion, hypothermia, lactate-resolved Only got 1 L fluid  eICU Interventions  Give another 1.5 L to meet goal Hold lasix DNR - pressors not clarified     Intervention Category Evaluation Type: New Patient Evaluation  [name] V. 10/01/2016, 4:44 PM

## 2016-10-01 NOTE — H&P (Signed)
History and Physical    Jennifer Ruiz KZS:[id number] DOB: 04/14/1946 DOA: 10/01/2016  Referring physician: Dr. Reita Cliche PCP: Crecencio Mc, MD  Specialists: Dr. Rogue Bussing  Chief Complaint: weakness with fall  HPI: Jennifer Ruiz is a 71 y.o. female has a past medical history significant for HTN, DM, and cholangiocarcinoma who presents after falling in her laundry room this AM due to profound weakness. In ER, pt was hypotensive and hypothermic. WBC and lactic acid elevated. CXR reveals LLL consolidation c/w pneumonia. She is now admitted. No fever. Denies CP, SOB, or cough. No actual syncope. Had vomiting last week but none recently. Has loose stools 1-2 times per day due to lactulose.  Review of Systems: The patient denies anorexia, fever, weight loss,, vision loss, decreased hearing, hoarseness, chest pain, syncope, dyspnea on exertion, peripheral edema, balance deficits, hemoptysis, abdominal pain, melena, hematochezia, severe indigestion/heartburn, hematuria, incontinence, genital sores, muscle weakness, suspicious skin lesions, transient blindness, difficulty walking, depression, unusual weight change, abnormal bleeding, enlarged lymph nodes, angioedema, and breast masses.   Past Medical History:  Diagnosis Date  . Ascites 01/09/2006   Ascites - 3L drawn off on 01/04/2016  . Breast lump in female 2014  . Cancer of kidney ([place])   . Diabetes mellitus without complication ([place])   . Elevated blood pressure reading   . Hypertension   . Liver mass 01/10/2016   Per patient; bloating.  Mass found via [place], CT, and MRI.    Past Surgical History:  Procedure Laterality Date  . BREAST BIOPSY  1996  . BREAST BIOPSY  2014   Calcifications - [place]  . BREAST LUMPECTOMY    . SHOULDER SURGERY  2011   right , [place]    Social History:  reports that she has never smoked. She has never used smokeless tobacco. She reports that she does not drink alcohol or use drugs.  Allergies  Allergen  Reactions  . Adhesive [Tape] Other (See Comments)    Blistering and redness    Family History  Problem Relation Age of Onset  . Cancer Mother 49    Breast Cancer  . Breast cancer Mother   . Asthma Father   . Diabetes Father     Prior to Admission medications   Medication Sig Start Date End Date Taking? Authorizing Provider  ergocalciferol (VITAMIN D2) 50000 units capsule Take 1 capsule (50,000 Units total) by mouth once a week. 07/27/16  Yes Cammie Sickle, MD  furosemide (LASIX) 20 MG tablet TAKE 2 TABLETS BY MOUTH DAILY Patient taking differently: TAKE 1 tablet bid 08/17/16  Yes Cammie Sickle, MD  lactulose, encephalopathy, (CHRONULAC) 10 GM/15ML SOLN Take 45 mLs (30 g total) by mouth 2 (two) times daily. 09/29/16  Yes Cammie Sickle, MD  ondansetron (ZOFRAN) 8 MG tablet Take 1 tablet by mouth every 8 (eight) hours as needed for nausea/vomiting. 09/23/16 10/23/16 Yes Historical Provider, MD  fluticasone (FLONASE) 50 MCG/ACT nasal spray Place 2 sprays into both nostrils daily. Patient not taking: Reported on 10/01/2016 08/30/16   Versie Starks, PA-C   Physical Exam: Vitals:   10/01/16 1245 10/01/16 1300 10/01/16 1330 10/01/16 1400  BP: (!) 105/53 (!) 95/51 (!) 98/56 (!) 97/56  Pulse: 86 87 88 88  Resp: 18  13 16   Temp: (!) 94.2 F (34.6 C) (!) 94.5 F (34.7 C) (!) 95.3 F (35.2 C) (!) 95.8 F (35.4 C)  TempSrc:      SpO2: 99% 98% 98% 97%  Weight:  General:  No apparent distress, WDWN, [place]/AT  Eyes: PERRL, EOMI, no scleral icterus, conjunctiva clear  ENT: moist oropharynx without exudate, TM's benign, dentition fair  Neck: supple, no lymphadenopathy. No bruits or thyromegaly  Cardiovascular: regular rate without MRG; 2+ peripheral pulses, no JVD, 2+ peripheral edema  Respiratory: left sided rhonchi with dullness at left base. No wheezes or rales. Respiratory effort normal  Abdomen: soft, non tender to palpation, positive bowel sounds, no  guarding, no rebound  Skin: no rashes or lesions  Musculoskeletal: normal bulk and tone, no joint swelling  Psychiatric: normal mood and affect, A&OX3  Neurologic: CN 2-12 grossly intact, Motor strength 5/5 in all 4 groups with symmetric DTR's and non-focal sensory exam  Labs on Admission:  Basic Metabolic Panel:  Recent Labs Lab 09/29/16 1218 10/01/16 1053  NA 130* 135  K 4.9 4.4  CL 98* 98*  CO2 24 22  GLUCOSE 98 128*  BUN 83* 87*  CREATININE 2.35* 2.29*  CALCIUM 8.2* 8.4*   Liver Function Tests:  Recent Labs Lab 09/29/16 1218 10/01/16 1053  AST 31 40  ALT 17 18  ALKPHOS 105 93  BILITOT 3.3* 3.3*  PROT 5.6* 6.1*  ALBUMIN 2.6* 3.0*   No results for input(s): LIPASE, AMYLASE in the last 168 hours.  Recent Labs Lab 09/29/16 1218 10/01/16 1059  AMMONIA 103* 109*   CBC:  Recent Labs Lab 09/29/16 1218 10/01/16 1053  WBC 9.8 17.3*  NEUTROABS 7.2*  --   HGB 11.6* 12.8  HCT 34.0* 37.8  MCV 90.4 91.9  PLT 204 225   Cardiac Enzymes: No results for input(s): CKTOTAL, CKMB, CKMBINDEX, TROPONINI in the last 168 hours.  BNP (last 3 results) No results for input(s): BNP in the last 8760 hours.  ProBNP (last 3 results) No results for input(s): PROBNP in the last 8760 hours.  CBG:  Recent Labs Lab 10/01/16 1048  GLUCAP 109*    Radiological Exams on Admission: Ct Head Wo Contrast  Result Date: 10/01/2016 CLINICAL DATA:  Pt brought in by [facility] from home. Pt has hx.o liver cancer. Per EMS pt fell this morning. Pt does not remember falling. Pt family at bedside and states that pt has had increased confusion. Confusion has been an ongoing issue. EXAM: CT HEAD WITHOUT CONTRAST CT CERVICAL SPINE WITHOUT CONTRAST TECHNIQUE: Multidetector CT imaging of the head and cervical spine was performed following the standard protocol without intravenous contrast. Multiplanar CT image reconstructions of the cervical spine were also generated. COMPARISON:  None. FINDINGS:  CT HEAD FINDINGS Brain: No evidence of acute infarction, hemorrhage, extra-axial collection, ventriculomegaly, or mass effect. Generalized cerebral atrophy. Periventricular white matter low attenuation likely secondary to microangiopathy. Vascular: Cerebrovascular atherosclerotic calcifications are noted. Skull: Negative for fracture or focal lesion. Sinuses/Orbits: Visualized portions of the orbits are unremarkable. Visualized portions of the paranasal sinuses and mastoid air cells are unremarkable. Other: None. CT CERVICAL SPINE FINDINGS Alignment: Normal. Skull base and vertebrae: No acute fracture. No primary bone lesion or focal pathologic process. Soft tissues and spinal canal: No prevertebral fluid or swelling. No visible canal hematoma. Disc levels: Minimal degenerative disc disease disc height loss at C5-6 and T1-2. At C3-4 there is a shallow central disc protrusion. At C4-5 there is a moderate-sized central disc protrusion abutting the ventral cervical spinal cord. At C5-6 there is a broad-based disc osteophyte complex. At C6-7 there is a mild broad-based disc bulge. Upper chest: Lung apices are clear. Other: No fluid collection or hematoma. IMPRESSION: 1. No  acute intracranial pathology. 2. No acute osseous injury the cervical spine. 3. Cervical spine spondylosis as described above. At C4-5 there is a moderate-sized central disc protrusion abutting the ventral cervical spinal cord. Electronically Signed   By: Kathreen Devoid   On: 10/01/2016 12:00   Ct Cervical Spine Wo Contrast  Result Date: 10/01/2016 CLINICAL DATA:  Pt brought in by [facility] from home. Pt has hx.o liver cancer. Per EMS pt fell this morning. Pt does not remember falling. Pt family at bedside and states that pt has had increased confusion. Confusion has been an ongoing issue. EXAM: CT HEAD WITHOUT CONTRAST CT CERVICAL SPINE WITHOUT CONTRAST TECHNIQUE: Multidetector CT imaging of the head and cervical spine was performed following the  standard protocol without intravenous contrast. Multiplanar CT image reconstructions of the cervical spine were also generated. COMPARISON:  None. FINDINGS: CT HEAD FINDINGS Brain: No evidence of acute infarction, hemorrhage, extra-axial collection, ventriculomegaly, or mass effect. Generalized cerebral atrophy. Periventricular white matter low attenuation likely secondary to microangiopathy. Vascular: Cerebrovascular atherosclerotic calcifications are noted. Skull: Negative for fracture or focal lesion. Sinuses/Orbits: Visualized portions of the orbits are unremarkable. Visualized portions of the paranasal sinuses and mastoid air cells are unremarkable. Other: None. CT CERVICAL SPINE FINDINGS Alignment: Normal. Skull base and vertebrae: No acute fracture. No primary bone lesion or focal pathologic process. Soft tissues and spinal canal: No prevertebral fluid or swelling. No visible canal hematoma. Disc levels: Minimal degenerative disc disease disc height loss at C5-6 and T1-2. At C3-4 there is a shallow central disc protrusion. At C4-5 there is a moderate-sized central disc protrusion abutting the ventral cervical spinal cord. At C5-6 there is a broad-based disc osteophyte complex. At C6-7 there is a mild broad-based disc bulge. Upper chest: Lung apices are clear. Other: No fluid collection or hematoma. IMPRESSION: 1. No acute intracranial pathology. 2. No acute osseous injury the cervical spine. 3. Cervical spine spondylosis as described above. At C4-5 there is a moderate-sized central disc protrusion abutting the ventral cervical spinal cord. Electronically Signed   By: Kathreen Devoid   On: 10/01/2016 12:00   Dg Chest Portable 1 View  Result Date: 10/01/2016 CLINICAL DATA:  History of liver carcinoma.  Fall earlier today EXAM: PORTABLE CHEST 1 VIEW COMPARISON:  June 14, 2016 FINDINGS: There is airspace consolidation in the medial left base. Lungs elsewhere are clear. Heart size and pulmonary vascularity are  normal. No adenopathy. No bone lesions. There is postoperative change in the proximal right humerus. IMPRESSION: Consolidation medial left base concerning for pneumonia. Based on appearance on prior chest radiograph, there may also be chronic atelectatic change in this area. Lungs elsewhere clear. No adenopathy. Stable cardiac silhouette. Electronically Signed   By: Lowella Grip III M.D.   On: 10/01/2016 13:21    EKG: Independently reviewed.  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 Begin IV fluids and IV ABX. Cultures sent. Follow sugars. Continue warming and monitor BP closely> Hold pressors for now. Consult Oncology. Begin O2 and SVN's  Diet: clear liquids Fluids: NS@100  DVT Prophylaxis: none  Code Status: DNR  Family Communication: yes  Disposition Plan: home  Time spent: 55 min

## 2016-10-01 NOTE — Consult Note (Signed)
[facility] Consultation    ASSESSMENT   SEPSIS LIKELY 2 TO PNEUMONIA H/O CHOLANGIOCARCINOMA HEPATIC ENCEPHALOPATHY ACUTE ON CHRONIC KIDNEY DISEASE LEFT PLEURAL EFFUSION  PLAN IVF resuscitation. On broad spectrum antibiotics. No need for vasopressors at this time. Lactulose.  Monitor UOP/creatinine closely. Diuretics on hold.  No indication for thoracentesis at this time. Glycemic control.  DNR.  Dimas Chyle MD PCCM    ADMISSION DATE:  10/01/2016 CONSULTATION DATE:  10/01/2016  REFERRING MD :  Hospitalist service  CHIEF COMPLAINT:  Sepsis  HISTORY OF PRESENT ILLNESS:  1F with PMH significant for cholangiocarcinoma brought in by family after she was found on floor by daughter. Patent reports tripping on a rug and fell. She denies any loss of consciousness or hitting her head. Patient has been a little confused over last week as per daughters. No fever, headache, dysuria, diarrhea reported. She reports taking lasix and has not noticed decreased uop. Patient noted to be hypotensive with elevated lactate in the ER and given fluid bolus. Started on broad spectrum antibiotics. Denies any chest pain or productive cough. + bruising. No fever at home.  PAST MEDICAL HISTORY :  Past Medical History:  Diagnosis Date  . Ascites 01/09/2006   Ascites - 3L drawn off on 01/04/2016  . Breast lump in female 2014  . Cancer of kidney ([place])   . Diabetes mellitus without complication ([place])   . Elevated blood pressure reading   . Hypertension   . Liver mass 01/10/2016   Per patient; bloating.  Mass found via [place], CT, and MRI.    Past Surgical History:  Procedure Laterality Date  . BREAST BIOPSY  1996  . BREAST BIOPSY  2014   Calcifications - [facility]  . BREAST LUMPECTOMY    . SHOULDER SURGERY  2011   right , [place]    Prior to Admission medications   Medication Sig Start Date End Date Taking? Authorizing Provider  ergocalciferol (VITAMIN D2) 50000 units capsule  Take 1 capsule (50,000 Units total) by mouth once a week. 07/27/16  Yes Cammie Sickle, MD  furosemide (LASIX) 20 MG tablet TAKE 2 TABLETS BY MOUTH DAILY Patient taking differently: TAKE 1 tablet bid 08/17/16  Yes Cammie Sickle, MD  lactulose, encephalopathy, (CHRONULAC) 10 GM/15ML SOLN Take 45 mLs (30 g total) by mouth 2 (two) times daily. 09/29/16  Yes Cammie Sickle, MD  ondansetron (ZOFRAN) 8 MG tablet Take 1 tablet by mouth every 8 (eight) hours as needed for nausea/vomiting. 09/23/16 10/23/16 Yes Historical Provider, MD  fluticasone (FLONASE) 50 MCG/ACT nasal spray Place 2 sprays into both nostrils daily. Patient not taking: Reported on 10/01/2016 08/30/16   Versie Starks, PA-C   Allergies  Allergen Reactions  . Adhesive [Tape] Other (See Comments)    Blistering and redness    FAMILY HISTORY:  Family History  Problem Relation Age of Onset  . Cancer Mother 30    Breast Cancer  . Breast cancer Mother   . Asthma Father   . Diabetes Father    SOCIAL HISTORY:  reports that she has never smoked. She has never used smokeless tobacco. She reports that she does not drink alcohol or use drugs.  REVIEW OF SYSTEMS:   The remainder of systems were reviewed and were found to be negative other than what is documented in the HPI.    VITAL SIGNS: Temp:  [90.9 F (32.7 C)-97.5 F (36.4 C)] 96.8 F (36 C) (04/29 1700) Pulse Rate:  [76-91]  91 (04/29 1700) Resp:  [9-18] 15 (04/29 1700) BP: (89-110)/(43-73) 95/43 (04/29 1700) SpO2:  [97 %-100 %] 98 % (04/29 1700) Weight:  [185 lb 6.5 oz (84.1 kg)-195 lb 8.8 oz (88.7 kg)] 185 lb 6.5 oz (84.1 kg) (04/29 1505) HEMODYNAMICS:      INTAKE / OUTPUT:  Intake/Output Summary (Last 24 hours) at 10/01/16 1742 Last data filed at 10/01/16 1709  Gross per 24 hour  Intake          2751.33 ml  Output              300 ml  Net          2451.33 ml    Physical Examination:   VS: BP (!) 95/43   Pulse 91   Temp (!) 96.8 F (36 C)    Resp 15   Ht 5\' 4"  (1.626 m)   Wt 185 lb 6.5 oz (84.1 kg)   SpO2 98%   BMI 31.83 kg/m    General Appearance: No distress  Neuro: without focal findings, mental status, speech normal,. HEENT: PERRLA, EOM intact, no ptosis, no other lesions noticed;  Pulmonary: decreased air entry left base with dullness to percussion, no wheezing or crackles heard Cardiovascular Normal S1,S2.  No m/r/g.    Abdomen: Benign, Soft, non-tender, No masses, hepatosplenomegaly, No lymphadenopathy Endoc: No evident thyromegaly Skin:   + ecchymosis  Extremities: normal, no cyanosis, clubbing, +leg edema   LABS: Reviewed   LABORATORY PANEL:   CBC  Recent Labs Lab 10/01/16 1053  WBC 17.3*  HGB 12.8  HCT 37.8  PLT 225    Chemistries   Recent Labs Lab 10/01/16 1053  NA 135  K 4.4  CL 98*  CO2 22  GLUCOSE 128*  BUN 87*  CREATININE 2.29*  CALCIUM 8.4*  AST 40  ALT 18  ALKPHOS 93  BILITOT 3.3*     Recent Labs Lab 10/01/16 1048 10/01/16 1649  GLUCAP 109* 130*   No results for input(s): PHART, PCO2ART, PO2ART in the last 168 hours.  Recent Labs Lab 09/29/16 1218 10/01/16 1053  AST 31 40  ALT 17 18  ALKPHOS 105 93  BILITOT 3.3* 3.3*  ALBUMIN 2.6* 3.0*      RADIOLOGY:  Ct Head Wo Contrast  Result Date: 10/01/2016 CLINICAL DATA:  Pt brought in by [facility] from home. Pt has hx.o liver cancer. Per EMS pt fell this morning. Pt does not remember falling. Pt family at bedside and states that pt has had increased confusion. Confusion has been an ongoing issue. EXAM: CT HEAD WITHOUT CONTRAST CT CERVICAL SPINE WITHOUT CONTRAST TECHNIQUE: Multidetector CT imaging of the head and cervical spine was performed following the standard protocol without intravenous contrast. Multiplanar CT image reconstructions of the cervical spine were also generated. COMPARISON:  None. FINDINGS: CT HEAD FINDINGS Brain: No evidence of acute infarction, hemorrhage, extra-axial collection, ventriculomegaly, or  mass effect. Generalized cerebral atrophy. Periventricular white matter low attenuation likely secondary to microangiopathy. Vascular: Cerebrovascular atherosclerotic calcifications are noted. Skull: Negative for fracture or focal lesion. Sinuses/Orbits: Visualized portions of the orbits are unremarkable. Visualized portions of the paranasal sinuses and mastoid air cells are unremarkable. Other: None. CT CERVICAL SPINE FINDINGS Alignment: Normal. Skull base and vertebrae: No acute fracture. No primary bone lesion or focal pathologic process. Soft tissues and spinal canal: No prevertebral fluid or swelling. No visible canal hematoma. Disc levels: Minimal degenerative disc disease disc height loss at C5-6 and T1-2. At C3-4 there is a shallow  central disc protrusion. At C4-5 there is a moderate-sized central disc protrusion abutting the ventral cervical spinal cord. At C5-6 there is a broad-based disc osteophyte complex. At C6-7 there is a mild broad-based disc bulge. Upper chest: Lung apices are clear. Other: No fluid collection or hematoma. IMPRESSION: 1. No acute intracranial pathology. 2. No acute osseous injury the cervical spine. 3. Cervical spine spondylosis as described above. At C4-5 there is a moderate-sized central disc protrusion abutting the ventral cervical spinal cord. Electronically Signed   By: Kathreen Devoid   On: 10/01/2016 12:00   Ct Cervical Spine Wo Contrast  Result Date: 10/01/2016 CLINICAL DATA:  Pt brought in by [facility] from home. Pt has hx.o liver cancer. Per EMS pt fell this morning. Pt does not remember falling. Pt family at bedside and states that pt has had increased confusion. Confusion has been an ongoing issue. EXAM: CT HEAD WITHOUT CONTRAST CT CERVICAL SPINE WITHOUT CONTRAST TECHNIQUE: Multidetector CT imaging of the head and cervical spine was performed following the standard protocol without intravenous contrast. Multiplanar CT image reconstructions of the cervical spine were also  generated. COMPARISON:  None. FINDINGS: CT HEAD FINDINGS Brain: No evidence of acute infarction, hemorrhage, extra-axial collection, ventriculomegaly, or mass effect. Generalized cerebral atrophy. Periventricular white matter low attenuation likely secondary to microangiopathy. Vascular: Cerebrovascular atherosclerotic calcifications are noted. Skull: Negative for fracture or focal lesion. Sinuses/Orbits: Visualized portions of the orbits are unremarkable. Visualized portions of the paranasal sinuses and mastoid air cells are unremarkable. Other: None. CT CERVICAL SPINE FINDINGS Alignment: Normal. Skull base and vertebrae: No acute fracture. No primary bone lesion or focal pathologic process. Soft tissues and spinal canal: No prevertebral fluid or swelling. No visible canal hematoma. Disc levels: Minimal degenerative disc disease disc height loss at C5-6 and T1-2. At C3-4 there is a shallow central disc protrusion. At C4-5 there is a moderate-sized central disc protrusion abutting the ventral cervical spinal cord. At C5-6 there is a broad-based disc osteophyte complex. At C6-7 there is a mild broad-based disc bulge. Upper chest: Lung apices are clear. Other: No fluid collection or hematoma. IMPRESSION: 1. No acute intracranial pathology. 2. No acute osseous injury the cervical spine. 3. Cervical spine spondylosis as described above. At C4-5 there is a moderate-sized central disc protrusion abutting the ventral cervical spinal cord. Electronically Signed   By: Kathreen Devoid   On: 10/01/2016 12:00   Dg Chest Portable 1 View  Result Date: 10/01/2016 CLINICAL DATA:  History of liver carcinoma.  Fall earlier today EXAM: PORTABLE CHEST 1 VIEW COMPARISON:  June 14, 2016 FINDINGS: There is airspace consolidation in the medial left base. Lungs elsewhere are clear. Heart size and pulmonary vascularity are normal. No adenopathy. No bone lesions. There is postoperative change in the proximal right humerus. IMPRESSION:  Consolidation medial left base concerning for pneumonia. Based on appearance on prior chest radiograph, there may also be chronic atelectatic change in this area. Lungs elsewhere clear. No adenopathy. Stable cardiac silhouette. Electronically Signed   By: Lowella Grip III M.D.   On: 10/01/2016 13:21       Journee Kohen MD PCCM   10/01/2016, 5:42 PM

## 2016-10-01 NOTE — ED Notes (Signed)
Pt transported to CT by [facility] RN and [facility]

## 2016-10-01 NOTE — Progress Notes (Signed)
Pharmacy Antibiotic Note  Jennifer Ruiz is a 71 y.o. female admitted on 10/01/2016 with pneumonia and sepsis.  Pharmacy has been consulted for Vancomycin and Zosyn dosing.  Ke=0.025, T1/2=28hr, Vd=49.8L  Plan: Will order Vancomycin 1g IV q36h to begin 13 hours after initial 1g dose for stacked dosing. Will check a trough level prior to 5th dose. Will order Zosyn 3.375g IV q8h EI  Weight: 195 lb 8.8 oz (88.7 kg)  Temp (24hrs), Avg:94 F (34.4 C), Min:90.9 F (32.7 C), Max:95.8 F (35.4 C)   Recent Labs Lab 09/29/16 1218 10/01/16 1053  WBC 9.8 17.3*  CREATININE 2.35* 2.29*  LATICACIDVEN  --  3.4*    Estimated Creatinine Clearance: 24.9 mL/min (A) (by C-G formula based on SCr of 2.29 mg/dL (H)).    Allergies  Allergen Reactions  . Adhesive [Tape] Other (See Comments)    Blistering and redness    Antimicrobials this admission: Zosyn 4/29 >>  Vanc 4/29 >>   Microbiology results: Will follow up on cultures  Thank you for allowing pharmacy to be a part of this patient's care.  Paulina Fusi, PharmD, BCPS 10/01/2016 2:27 PM

## 2016-10-01 NOTE — Progress Notes (Signed)
Pt is in stable condition at this time. No further episodes of N&V. Pt continues to deny pain. Full assessment in EPIC. Report given to [place], Therapist, sports.

## 2016-10-01 NOTE — Progress Notes (Signed)
Pt experienced one episode of N&V. Given PRN zofran as per orders. Will continue to assess.

## 2016-10-01 NOTE — ED Notes (Addendum)
Pt returned from CT °

## 2016-10-01 NOTE — Progress Notes (Signed)
E-Link called to request SCD's. Pt unable to tolerate TED hose. Verbal orders to switch to SCD's. Orders read back and acknowledged. Will continue to assess.

## 2016-10-01 NOTE — ED Provider Notes (Signed)
[facility] Emergency Department Provider Note ____________________________________________   I have reviewed the triage vital signs and the triage nursing note.  HISTORY  Chief Complaint Fall; Altered Mental Status; and Code Sepsis   Historian Limited history from patient as she is a poor historian due to confusion History from daughter as well as family friend  HPI Jennifer Ruiz is a 71 y.o. female with a history of liver cancer as well as ascites, followed by oncology with a history of prior elevated ammonia, as well as paracentesis for bloating a few days ago, presents today after unwitnessed fall. Daughter went to check on her mother this morning and when she got there and found her lying on the floor in the laundry room adjacent to the garage. Patient is not quite sure if she fell or passed out or what exactly happened. She stated that it happened when it was light outside, so presumably this morning. On exam she was noted to have a bruise and an abrasion to her right upper trunk, and when the EMS picked her up she sustained a small left forearm skin tear.  Denying any traumatic injury or pain, however family states that she has not had any pain during the entirety of her cancer treatment, and so they don't think that she really is that reliable of a historian. They also think she is a little bit more confused today.    Past Medical History:  Diagnosis Date  . Ascites 01/09/2006   Ascites - 3L drawn off on 01/04/2016  . Breast lump in female 2014  . Cancer of kidney ([place])   . Diabetes mellitus without complication ([place])   . Elevated blood pressure reading   . Hypertension   . Liver mass 01/10/2016   Per patient; bloating.  Mass found via [place], CT, and MRI.     Patient Active Problem List   Diagnosis Date Noted  . Fatigue 07/26/2016  . Pleural effusion on left 06/14/2016  . 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 Carcinoma of unknown primary  ([place]) 12/31/2015  . Liver mass 12/22/2015  . 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 Edema extremities 12/03/2015  . Abdominal distension 12/01/2015  . Venous insufficiency of both lower extremities 10/08/2015  . Mammographic microcalcification 03/12/2013  . Medicare annual wellness visit, subsequent 08/07/2012  . Personal history of colonic polyps 04/03/2012  . White coat hypertension 04/03/2012  . Diabetes mellitus type 2 in obese ([place]) 04/02/2012  . Obesity (BMI 30-39.9) 04/02/2012    Past Surgical History:  Procedure Laterality Date  . BREAST BIOPSY  1996  . BREAST BIOPSY  2014   Calcifications - [place]  . BREAST LUMPECTOMY    . SHOULDER SURGERY  2011   right , Rudene Christians     Prior to Admission medications   Medication Sig Start Date End Date Taking? Authorizing Provider  ergocalciferol (VITAMIN D2) 50000 units capsule Take 1 capsule (50,000 Units total) by mouth once a week. 07/27/16  Yes Cammie Sickle, MD  furosemide (LASIX) 20 MG tablet TAKE 2 TABLETS BY MOUTH DAILY Patient taking differently: TAKE 1 tablet bid 08/17/16  Yes Cammie Sickle, MD  lactulose, encephalopathy, (CHRONULAC) 10 GM/15ML SOLN Take 45 mLs (30 g total) by mouth 2 (two) times daily. 09/29/16  Yes Cammie Sickle, MD  ondansetron (ZOFRAN) 8 MG tablet Take 1 tablet by mouth every 8 (eight) hours as needed for nausea/vomiting. 09/23/16 10/23/16 Yes Historical Provider, MD  fluticasone Asencion Islam)  50 MCG/ACT nasal spray Place 2 sprays into both nostrils daily. Patient not taking: Reported on 10/01/2016 08/30/16   Versie Starks, PA-C    Allergies  Allergen Reactions  . Adhesive [Tape] Other (See Comments)    Blistering and redness    Family History  Problem Relation Age of Onset  . Cancer Mother 19    Breast Cancer  . Breast cancer Mother   . Asthma Father   . Diabetes Father     Social History Social History  Substance Use Topics  . Smoking status: Never Smoker  . Smokeless  tobacco: Never Used  . Alcohol use No    Review of Systems  Constitutional: Negative for fever. Eyes: Negative for visual changes. ENT: Negative for sore throat. Cardiovascular: Negative for chest pain. Respiratory: Negative for shortness of breath. Gastrointestinal: Negative for abdominal pain, vomiting and diarrhea. Genitourinary: Negative for dysuria. Musculoskeletal: Negative for back pain. Skin: Negative for rash. Neurological: Negative for headache. 10 point Review of Systems otherwise negative ____________________________________________   PHYSICAL EXAM:  VITAL SIGNS: ED Triage Vitals  Enc Vitals Group     BP 10/01/16 1041 97/73     Pulse Rate 10/01/16 1041 76     Resp 10/01/16 1041 16     Temp 10/01/16 1054 (!) 93.5 F (34.2 C)     Temp Source 10/01/16 1054 Rectal     SpO2 10/01/16 1038 100 %     Weight 10/01/16 1041 195 lb 8.8 oz (88.7 kg)     Height --      Head Circumference --      Peak Flow --      Pain Score --      Pain Loc --      Pain Edu? --      Excl. in [place]? --      Constitutional: Alert and confused.  Well appearing and in no distress. HEENT   Head: Normocephalic and atraumatic.      Eyes: Conjunctivae are normal. PERRL. Normal extraocular movements.      Ears:         Nose: No congestion/rhinnorhea.   Mouth/Throat: Mucous membranes are moist.   Neck: No stridor.  Denies pain, no posterior step off. Cardiovascular/Chest:  Normal rate, regular rhythm.  No murmurs, rubs, or gallops. Respiratory: Normal respiratory effort without tachypnea nor retractions. Breath sounds are clear and equal bilaterally. No wheezes/rales/rhonchi. Gastrointestinal: Soft. No distention, no guarding, no rebound. Nontender. Genitourinary/rectal: Deferred Musculoskeletal: Nontender with normal range of motion in all extremities. No joint effusions.  No lower extremity tenderness.  No edema. Neurologic:  Normal speech and language. No gross or focal neurologic  deficits are appreciated. Skin:  Skin is warm, dry and intact. No rash noted. Psychiatric: Mood and affect are normal. Speech and behavior are normal. Patient exhibits appropriate insight and judgment.   ____________________________________________  LABS (pertinent positives/negatives)  Labs Reviewed  COMPREHENSIVE METABOLIC PANEL - Abnormal; Notable for the following:       Result Value   Chloride 98 (*)    Glucose, Bld 128 (*)    BUN 87 (*)    Creatinine, Ser 2.29 (*)    Calcium 8.4 (*)    Total Protein 6.1 (*)    Albumin 3.0 (*)    Total Bilirubin 3.3 (*)    GFR calc non Af Amer 20 (*)    GFR calc Af Amer 23 (*)    All other components within normal limits  CBC - Abnormal; Notable  for the following:    WBC 17.3 (*)    RDW 16.5 (*)    All other components within normal limits  GLUCOSE, CAPILLARY - Abnormal; Notable for the following:    Glucose-Capillary 109 (*)    All other components within normal limits  LACTIC ACID, PLASMA - Abnormal; Notable for the following:    Lactic Acid, Venous 3.4 (*)    All other components within normal limits  PROTIME-INR - Abnormal; Notable for the following:    Prothrombin Time 16.3 (*)    All other components within normal limits  URINALYSIS, COMPLETE (UACMP) WITH MICROSCOPIC - Abnormal; Notable for the following:    Color, Urine AMBER (*)    APPearance HAZY (*)    Squamous Epithelial / LPF 0-5 (*)    All other components within normal limits  AMMONIA - Abnormal; Notable for the following:    Ammonia 109 (*)    All other components within normal limits  BLOOD GAS, VENOUS - Abnormal; Notable for the following:    Acid-base deficit 3.7 (*)    All other components within normal limits  CULTURE, BLOOD (ROUTINE X 2)  CULTURE, BLOOD (ROUTINE X 2)  URINE CULTURE  LACTIC ACID, PLASMA  CBG MONITORING, ED    ____________________________________________    EKG I, Lisa Roca, MD, the attending physician have personally viewed and  interpreted all ECGs.  89 bpm. Normal sinus rhythm. Narrow QRS. Nonspecific ST and T-wave ____________________________________________  RADIOLOGY All Xrays were viewed by me. Imaging interpreted by Radiologist.  CT head and cspine wo:  IMPRESSION: 1. No acute intracranial pathology. 2. No acute osseous injury the cervical spine. 3. Cervical spine spondylosis as described above. At C4-5 there is a moderate-sized central disc protrusion abutting the ventral cervical spinal cord.   GGE:ZMOQHUTMLY: Consolidation medial left base concerning for pneumonia. Based on appearance on prior chest radiograph, there may also be chronic atelectatic change in this area. Lungs elsewhere clear. No adenopathy. Stable cardiac silhouette. __________________________________________  PROCEDURES  Procedure(s) performed: None  Critical Care performed: CRITICAL CARE Performed by: Lisa Roca   Total critical care time: 45 minutes  Critical care time was exclusive of separately billable procedures and treating other patients.  Critical care was necessary to treat or prevent imminent or life-threatening deterioration.  Critical care was time spent personally by me on the following activities: development of treatment plan with patient and/or surrogate as well as nursing, discussions with consultants, evaluation of patient's response to treatment, examination of patient, obtaining history from patient or surrogate, ordering and performing treatments and interventions, ordering and review of laboratory studies, ordering and review of radiographic studies, pulse oximetry and re-evaluation of patient's condition.   ____________________________________________   ED COURSE / ASSESSMENT AND PLAN  Pertinent labs & imaging results that were available during my care of the patient were reviewed by me and considered in my medical decision making (see chart for details).   Arrived found on the ground  after unwitnessed fall, confused with hx of liver cancer.  On arrival, found to have low body temp, initially thought maybe due to exposure as she was in a not insulated laundry room.  Placed on a beir hugger.  Denies recent infectious symptoms.  No abdominal pain or swelling.  No ascites on exam.  Body temp did not come up that quickly with bear hugger, and laboratory studies show elevated white blood count 17,000, at this point I felt it more concerned about possibility of sepsis versus environmental exposure.  She had no tachycardia nor hypotension.  She does have acute on chronic renal failure.  She was given IV vancomycin, azithromycin, and cefepime for undifferentiated sepsis.  Patient has DO NOT RESUSCITATE paperwork with her. We had a frank discussion about treatment of sepsis and potential volume overload with respiratory failure, family and patient feel like they do not want to push too hard with fluids at the risk of inducing respiratory failure. I discussed with them and they understand that this would not be the best approach for sepsis, but given her situation with liver cancer, we are going to proceed cautiously in terms of fluids even at risk of sepsis treatment.   CXR possible left sided pneumonia.    CONSULTATIONS:  Hospitalist for admission.   Patient / Family / Caregiver informed of clinical course, medical decision-making process, and agree with plan.    ___________________________________________   FINAL CLINICAL IMPRESSION(S) / ED DIAGNOSES   Final diagnoses:  AKI (acute kidney injury) ([place])  Hepatic encephalopathy ([place])  Sepsis, due to unspecified organism ([place])  Pneumonia of left lower lobe due to infectious organism [facility])              Note: This dictation was prepared with Dragon dictation. Any transcriptional errors that result from this process are unintentional    Lisa Roca, MD 10/01/16 1346

## 2016-10-01 NOTE — Plan of Care (Signed)
Problem: Education: Goal: Knowledge of [place] [facility] Education information/materials will improve Outcome: Progressing Pt and family received welcome packet today and were oriented to the unit.   Problem: Safety: Goal: Ability to remain free from injury will improve Outcome: Progressing Pt has remained free form injury on my shift.   Problem: Pain Managment: Goal: General experience of comfort will improve Outcome: Progressing Pt has remained free form pain on my shift.   Problem: Fluid Volume: Goal: Hemodynamic stability will improve Outcome: Progressing Pt currently receiving fluid bolus per sepsis protocol. BP remains low.   Problem: Physical Regulation: Goal: Diagnostic test results will improve Outcome: Progressing Lactic acid level dropping.

## 2016-10-02 DIAGNOSIS — J189 Pneumonia, unspecified organism: Secondary | ICD-10-CM

## 2016-10-02 DIAGNOSIS — R188 Other ascites: Secondary | ICD-10-CM

## 2016-10-02 DIAGNOSIS — I959 Hypotension, unspecified: Secondary | ICD-10-CM

## 2016-10-02 DIAGNOSIS — I10 Essential (primary) hypertension: Secondary | ICD-10-CM

## 2016-10-02 DIAGNOSIS — E119 Type 2 diabetes mellitus without complications: Secondary | ICD-10-CM

## 2016-10-02 DIAGNOSIS — N179 Acute kidney failure, unspecified: Secondary | ICD-10-CM

## 2016-10-02 DIAGNOSIS — Z803 Family history of malignant neoplasm of breast: Secondary | ICD-10-CM

## 2016-10-02 DIAGNOSIS — R630 Anorexia: Secondary | ICD-10-CM

## 2016-10-02 DIAGNOSIS — K7290 Hepatic failure, unspecified without coma: Secondary | ICD-10-CM

## 2016-10-02 DIAGNOSIS — R410 Disorientation, unspecified: Secondary | ICD-10-CM

## 2016-10-02 DIAGNOSIS — J181 Lobar pneumonia, unspecified organism: Secondary | ICD-10-CM

## 2016-10-02 DIAGNOSIS — Z7189 Other specified counseling: Secondary | ICD-10-CM

## 2016-10-02 DIAGNOSIS — Z85528 Personal history of other malignant neoplasm of kidney: Secondary | ICD-10-CM

## 2016-10-02 DIAGNOSIS — A419 Sepsis, unspecified organism: Principal | ICD-10-CM

## 2016-10-02 DIAGNOSIS — R944 Abnormal results of kidney function studies: Secondary | ICD-10-CM

## 2016-10-02 DIAGNOSIS — K7469 Other cirrhosis of liver: Secondary | ICD-10-CM

## 2016-10-02 DIAGNOSIS — C221 Intrahepatic bile duct carcinoma: Secondary | ICD-10-CM

## 2016-10-02 DIAGNOSIS — Z515 Encounter for palliative care: Secondary | ICD-10-CM

## 2016-10-02 LAB — COMPREHENSIVE METABOLIC PANEL
ALK PHOS: 66 U/L (ref 38–126)
ALT: 15 U/L (ref 14–54)
AST: 30 U/L (ref 15–41)
Albumin: 2 g/dL — ABNORMAL LOW (ref 3.5–5.0)
Anion gap: 8 (ref 5–15)
BUN: 79 mg/dL — AB (ref 6–20)
CALCIUM: 7.3 mg/dL — AB (ref 8.9–10.3)
CO2: 20 mmol/L — ABNORMAL LOW (ref 22–32)
CREATININE: 2.12 mg/dL — AB (ref 0.44–1.00)
Chloride: 105 mmol/L (ref 101–111)
GFR, EST AFRICAN AMERICAN: 26 mL/min — AB (ref >60)
GFR, EST NON AFRICAN AMERICAN: 22 mL/min — AB (ref >60)
Glucose, Bld: 102 mg/dL — ABNORMAL HIGH (ref 65–99)
Potassium: 4.2 mmol/L (ref 3.5–5.1)
Sodium: 133 mmol/L — ABNORMAL LOW (ref 135–145)
Total Bilirubin: 2.3 mg/dL — ABNORMAL HIGH (ref 0.3–1.2)
Total Protein: 4.1 g/dL — ABNORMAL LOW (ref 6.5–8.1)

## 2016-10-02 LAB — GLUCOSE, CAPILLARY
Glucose-Capillary: 89 mg/dL (ref 65–99)
Glucose-Capillary: 101 mg/dL — ABNORMAL HIGH (ref 65–99)

## 2016-10-02 LAB — BLOOD GAS, VENOUS
Acid-base deficit: 3.7 mmol/L — ABNORMAL HIGH (ref 0.0–2.0)
Bicarbonate: 22.7 mmol/L (ref 20.0–28.0)
Patient temperature: 37
pCO2, Ven: 45 mmHg (ref 44.0–60.0)
pH, Ven: 7.31 (ref 7.250–7.430)

## 2016-10-02 LAB — PROTIME-INR
INR: 1.55
Prothrombin Time: 18.7 seconds — ABNORMAL HIGH (ref 11.4–15.2)

## 2016-10-02 LAB — CBC
HEMATOCRIT: 27.3 % — AB (ref 35.0–47.0)
HEMOGLOBIN: 9.3 g/dL — AB (ref 12.0–16.0)
MCH: 30.7 pg (ref 26.0–34.0)
MCHC: 34 g/dL (ref 32.0–36.0)
MCV: 90.3 fL (ref 80.0–100.0)
Platelets: 163 10*3/uL (ref 150–440)
RBC: 3.02 MIL/uL — AB (ref 3.80–5.20)
RDW: 16.5 % — ABNORMAL HIGH (ref 11.5–14.5)
WBC: 8.9 10*3/uL (ref 3.6–11.0)

## 2016-10-02 LAB — PROCALCITONIN: PROCALCITONIN: 0.9 ng/mL

## 2016-10-02 LAB — STREP PNEUMONIAE URINARY ANTIGEN: STREP PNEUMO URINARY ANTIGEN: NEGATIVE

## 2016-10-02 MED ORDER — OXYCODONE HCL 20 MG/ML PO CONC: 5.0000 mg | ORAL | Status: DC | PRN

## 2016-10-02 MED ORDER — SODIUM CHLORIDE 0.9% FLUSH
3.0000 mL | Freq: Two times a day (BID) | INTRAVENOUS | Status: DC
  Administered 2016-10-02 – 2016-10-03 (×3): 3 mL via INTRAVENOUS

## 2016-10-02 MED ORDER — OXYCODONE HCL 20 MG/ML PO CONC: 5.0000 mg | ORAL | 0 refills | Status: AC | PRN

## 2016-10-02 MED ORDER — LORAZEPAM 2 MG/ML IJ SOLN
1.0000 mg | INTRAMUSCULAR | Status: DC | PRN
  Administered 2016-10-02: 1 mg via INTRAVENOUS
  Filled 2016-10-02: qty 1

## 2016-10-02 MED ORDER — GLYCOPYRROLATE 0.2 MG/ML IJ SOLN: 0.2000 mg | INTRAMUSCULAR | Status: DC | PRN

## 2016-10-02 MED ORDER — ALBUMIN HUMAN 5 % IV SOLN
12.5000 g | Freq: Once | INTRAVENOUS | Status: AC
  Administered 2016-10-02: 12.5 g via INTRAVENOUS
  Filled 2016-10-02: qty 250

## 2016-10-02 MED ORDER — ONDANSETRON 4 MG PO TBDP
4.0000 mg | ORAL_TABLET | Freq: Four times a day (QID) | ORAL | Status: DC | PRN
  Filled 2016-10-02: qty 1

## 2016-10-02 MED ORDER — HALOPERIDOL 0.5 MG PO TABS
0.5000 mg | ORAL_TABLET | ORAL | Status: DC | PRN
  Filled 2016-10-02: qty 1

## 2016-10-02 MED ORDER — HALOPERIDOL LACTATE 5 MG/ML IJ SOLN: 0.5000 mg | INTRAMUSCULAR | Status: DC | PRN

## 2016-10-02 MED ORDER — FAMOTIDINE IN NACL 20-0.9 MG/50ML-% IV SOLN: 20.0000 mg | Freq: Two times a day (BID) | INTRAVENOUS | Status: DC

## 2016-10-02 MED ORDER — LORAZEPAM 1 MG PO TABS: 1.0000 mg | ORAL_TABLET | ORAL | Status: DC | PRN

## 2016-10-02 MED ORDER — GLYCOPYRROLATE 1 MG PO TABS: 1.0000 mg | ORAL_TABLET | ORAL | 0 refills | Status: AC | PRN

## 2016-10-02 MED ORDER — SODIUM CHLORIDE 0.9% FLUSH: 3.0000 mL | INTRAVENOUS | Status: DC | PRN

## 2016-10-02 MED ORDER — GLYCOPYRROLATE 1 MG PO TABS: 1.0000 mg | ORAL_TABLET | ORAL | Status: DC | PRN

## 2016-10-02 MED ORDER — ACETAMINOPHEN 650 MG RE SUPP: 650.0000 mg | Freq: Four times a day (QID) | RECTAL | Status: DC | PRN

## 2016-10-02 MED ORDER — SODIUM CHLORIDE 0.9 % IV SOLN: 250.0000 mL | INTRAVENOUS | Status: DC | PRN

## 2016-10-02 MED ORDER — HALOPERIDOL LACTATE 2 MG/ML PO CONC
0.5000 mg | ORAL | Status: DC | PRN
  Filled 2016-10-02: qty 0.3

## 2016-10-02 MED ORDER — ACETAMINOPHEN 325 MG PO TABS: 650.0000 mg | ORAL_TABLET | Freq: Four times a day (QID) | ORAL | Status: DC | PRN

## 2016-10-02 MED ORDER — LORAZEPAM 2 MG/ML PO CONC: 1.0000 mg | ORAL | Status: DC | PRN

## 2016-10-02 MED ORDER — HALOPERIDOL 0.5 MG PO TABS: 0.5000 mg | ORAL_TABLET | ORAL | 0 refills | Status: AC | PRN

## 2016-10-02 MED ORDER — MORPHINE SULFATE (PF) 2 MG/ML IV SOLN
2.0000 mg | INTRAVENOUS | Status: DC | PRN
  Administered 2016-10-03: 2 mg via INTRAVENOUS
  Filled 2016-10-02: qty 1

## 2016-10-02 MED ORDER — ONDANSETRON HCL 4 MG/2ML IJ SOLN: 4.0000 mg | Freq: Four times a day (QID) | INTRAMUSCULAR | Status: DC | PRN

## 2016-10-02 MED ORDER — IPRATROPIUM-ALBUTEROL 0.5-2.5 (3) MG/3ML IN SOLN: 3.0000 mL | Freq: Four times a day (QID) | RESPIRATORY_TRACT | Status: DC | PRN

## 2016-10-02 MED ORDER — LORAZEPAM 1 MG PO TABS: 1.0000 mg | ORAL_TABLET | ORAL | 0 refills | Status: AC | PRN

## 2016-10-02 NOTE — Consult Note (Signed)
[facility]  CONSULT NOTE    Date: 10/02/2016                  Patient Name:  Jennifer Ruiz  MRN: [id number]  DOB: 01/22/1946  Age / Sex: 71 y.o., female         PCP: Crecencio Mc, MD                 Service Requesting Consult: Dr. Alva Garnet                 Reason for Consult: Acute renal failure            History of Present Illness: Ms. Jennifer Ruiz is a 71 y.o. white female with hypertension, diabetes mellitus type II, cholangiocarcinoma, who was admitted to [facility] on 10/01/2016 for Hepatic encephalopathy ([place]) [K72.90] AKI (acute kidney injury) ([place]) [N17.9] Sepsis, due to unspecified organism ([place]) [A41.9] Pneumonia of left lower lobe due to infectious organism [facility]) [J18.1]   Patient fell in her laundry room. Currently with hepatic encephalopathy and unable to give much of a history. Son-in-law is at bedside.   Patient with hyponatremia, acute renal failure and hypoalbuminemia. She has been given IV albumin.   Creatinine of 2.35 on 4/27 and today is 2/12 with nonoliguric urine output.    Medications: Outpatient medications: Prescriptions Prior to Admission  Medication Sig Dispense Refill Last Dose  . ergocalciferol (VITAMIN D2) 50000 units capsule Take 1 capsule (50,000 Units total) by mouth once a week. 12 capsule 1 09/29/2016  . furosemide (LASIX) 20 MG tablet TAKE 2 TABLETS BY MOUTH DAILY (Patient taking differently: TAKE 1 tablet bid) 60 tablet 3 09/29/2016  . lactulose, encephalopathy, (CHRONULAC) 10 GM/15ML SOLN Take 45 mLs (30 g total) by mouth 2 (two) times daily. 500 mL 0 09/30/2016 at pm  . ondansetron (ZOFRAN) 8 MG tablet Take 1 tablet by mouth every 8 (eight) hours as needed for nausea/vomiting.   prn at prn  . fluticasone (FLONASE) 50 MCG/ACT nasal spray Place 2 sprays into both nostrils daily. (Patient not taking: Reported on 10/01/2016) 16 g 6 Not Taking at Unknown time    Current medications: Current Facility-Administered  Medications  Medication Dose Route Frequency Provider Last Rate Last Dose  . 0.9 %  sodium chloride infusion  250 mL Intravenous PRN Earlie Counts, NP      . acetaminophen (TYLENOL) tablet 650 mg  650 mg Oral Q6H PRN Earlie Counts, NP       Or  . acetaminophen (TYLENOL) suppository 650 mg  650 mg Rectal Q6H PRN Earlie Counts, NP      . bisacodyl (DULCOLAX) suppository 10 mg  10 mg Rectal Daily PRN Idelle Crouch, MD      . docusate sodium (COLACE) capsule 100 mg  100 mg Oral BID Idelle Crouch, MD   100 mg at 10/01/16 2120  . glycopyrrolate (ROBINUL) tablet 1 mg  1 mg Oral Q4H PRN Earlie Counts, NP       Or  . glycopyrrolate (ROBINUL) injection 0.2 mg  0.2 mg Subcutaneous Q4H PRN Earlie Counts, NP       Or  . glycopyrrolate (ROBINUL) injection 0.2 mg  0.2 mg Intravenous Q4H PRN Earlie Counts, NP      . haloperidol (HALDOL) tablet 0.5 mg  0.5 mg Oral Q4H PRN Earlie Counts, NP       Or  . haloperidol (HALDOL) 2 MG/ML  solution 0.5 mg  0.5 mg Sublingual Q4H PRN Earlie Counts, NP       Or  . haloperidol lactate (HALDOL) injection 0.5 mg  0.5 mg Intravenous Q4H PRN Earlie Counts, NP      . ipratropium-albuterol (DUONEB) 0.5-2.5 (3) MG/3ML nebulizer solution 3 mL  3 mL Nebulization Q6H PRN Wilhelmina Mcardle, MD      . LORazepam (ATIVAN) tablet 1 mg  1 mg Oral Q4H PRN Earlie Counts, NP       Or  . LORazepam (ATIVAN) 2 MG/ML concentrated solution 1 mg  1 mg Sublingual Q4H PRN Earlie Counts, NP       Or  . LORazepam (ATIVAN) injection 1 mg  1 mg Intravenous Q4H PRN Earlie Counts, NP      . ondansetron (ZOFRAN-ODT) disintegrating tablet 4 mg  4 mg Oral Q6H PRN Earlie Counts, NP       Or  . ondansetron (ZOFRAN) injection 4 mg  4 mg Intravenous Q6H PRN Earlie Counts, NP      . oxyCODONE (ROXICODONE INTENSOL) 20 MG/ML concentrated solution 5 mg  5 mg Oral Q2H PRN Earlie Counts, NP       Or  . oxyCODONE (ROXICODONE INTENSOL) 20 MG/ML concentrated solution 5 mg  5 mg Sublingual Q2H PRN Earlie Counts,  NP      . pantoprazole (PROTONIX) injection 40 mg  40 mg Intravenous Q12H Idelle Crouch, MD   40 mg at 10/02/16 1010  . sodium chloride flush (NS) 0.9 % injection 3 mL  3 mL Intravenous Q12H Earlie Counts, NP      . sodium chloride flush (NS) 0.9 % injection 3 mL  3 mL Intravenous PRN Earlie Counts, NP          Allergies: Allergies  Allergen Reactions  . Adhesive [Tape] Other (See Comments)    Blistering and redness      Past Medical History: Past Medical History:  Diagnosis Date  . Ascites 01/09/2006   Ascites - 3L drawn off on 01/04/2016  . Breast lump in female 2014  . Cancer of kidney ([place])   . Diabetes mellitus without complication ([place])   . Elevated blood pressure reading   . Hypertension   . Liver mass 01/10/2016   Per patient; bloating.  Mass found via [place], CT, and MRI.      Past Surgical History: Past Surgical History:  Procedure Laterality Date  . BREAST BIOPSY  1996  . BREAST BIOPSY  2014   Calcifications - [place]  . BREAST LUMPECTOMY    . SHOULDER SURGERY  2011   right , [place]      Family History: Family History  Problem Relation Age of Onset  . Cancer Mother 56    Breast Cancer  . Breast cancer Mother   . Asthma Father   . Diabetes Father      Social History: Social History   Social History  . Marital status: Divorced    Spouse name: N/A  . Number of children: N/A  . Years of education: N/A   Occupational History  . Not on file.   Social History Main Topics  . Smoking status: Never Smoker  . Smokeless tobacco: Never Used  . Alcohol use No  . Drug use: No  . Sexual activity: Not on file   Other Topics Concern  . Not on file   Social History Narrative  . No narrative on  file     Review of Systems: Review of Systems  Unable to perform ROS: Mental status change    Vital Signs: Blood pressure (!) 88/42, pulse 69, temperature 97.9 F (36.6 C), temperature source Core (Comment), resp. rate 14, height 5\' 4"  (1.626 m), weight  90.8 kg (200 lb 2.8 oz), SpO2 98 %.  Weight trends: Filed Weights   10/01/16 1041 10/01/16 1505 10/02/16 0459  Weight: 88.7 kg (195 lb 8.8 oz) 84.1 kg (185 lb 6.5 oz) 90.8 kg (200 lb 2.8 oz)    Physical Exam: General: NAD, laying in bed  Head: Normocephalic, atraumatic. Dry oral mucosal membranes  Eyes: Anicteric, PERRL  Neck: Supple, trachea midline  Lungs:  Clear to auscultation  Heart: Regular rate and rhythm  Abdomen:  Soft, nontender,   Extremities: no peripheral edema.  Neurologic: lethargic  Skin: No lesions         Lab results: Basic Metabolic Panel:  Recent Labs Lab 10/01/16 1053 10/01/16 1901 10/02/16 0414  NA 135 134* 133*  K 4.4 4.2 4.2  CL 98* 105 105  CO2 22 20* 20*  GLUCOSE 128* 136* 102*  BUN 87* 82* 79*  CREATININE 2.29* 2.12* 2.12*  CALCIUM 8.4* 7.6* 7.3*  MG  --  2.6*  --   PHOS  --  4.5  --     Liver Function Tests:  Recent Labs Lab 09/29/16 1218 10/01/16 1053 10/02/16 0414  AST 31 40 30  ALT 17 18 15   ALKPHOS 105 93 66  BILITOT 3.3* 3.3* 2.3*  PROT 5.6* 6.1* 4.1*  ALBUMIN 2.6* 3.0* 2.0*   No results for input(s): LIPASE, AMYLASE in the last 168 hours.  Recent Labs Lab 09/29/16 1218 10/01/16 1059  AMMONIA 103* 109*    CBC:  Recent Labs Lab 09/29/16 1218 10/01/16 1053 10/02/16 0414  WBC 9.8 17.3* 8.9  NEUTROABS 7.2*  --   --   HGB 11.6* 12.8 9.3*  HCT 34.0* 37.8 27.3*  MCV 90.4 91.9 90.3  PLT 204 225 163    Cardiac Enzymes:  Recent Labs Lab 10/01/16 1901  TROPONINI <0.03    BNP: Invalid input(s): POCBNP  CBG:  Recent Labs Lab 10/01/16 1048 10/01/16 1649 10/01/16 2111 10/02/16 0720 10/02/16 1121  GLUCAP 109* 130* 127* 29 101*    Microbiology: Results for orders placed or performed during the hospital encounter of 10/01/16  Culture, blood (Routine x 2)     Status: None (Preliminary result)   Collection Time: 10/01/16 10:53 AM  Result Value Ref Range Status   Specimen Description BLOOD Blood  Culture adequate volume  Final   Special Requests   Final    BOTTLES DRAWN AEROBIC AND ANAEROBIC LEFT ANTECUBITAL   Culture NO GROWTH < 24 HOURS  Final   Report Status PENDING  Incomplete  Culture, blood (Routine x 2)     Status: None (Preliminary result)   Collection Time: 10/01/16 10:53 AM  Result Value Ref Range Status   Specimen Description BLOOD Blood Culture adequate volume  Final   Special Requests   Final    BOTTLES DRAWN AEROBIC AND ANAEROBIC BLOOD RIGHT ARM   Culture NO GROWTH < 24 HOURS  Final   Report Status PENDING  Incomplete  MRSA PCR Screening     Status: None   Collection Time: 10/01/16  2:59 PM  Result Value Ref Range Status   MRSA by PCR NEGATIVE NEGATIVE Final    Comment:        The GeneXpert MRSA  Assay (FDA approved for NASAL specimens only), is one component of a comprehensive MRSA colonization surveillance program. It is not intended to diagnose MRSA infection nor to guide or monitor treatment for MRSA infections.     Coagulation Studies:  Recent Labs  10/01/16 1053 10/02/16 0414  LABPROT 16.3* 18.7*  INR 1.30 1.55    Urinalysis:  Recent Labs  10/01/16 1053  COLORURINE AMBER*  LABSPEC 1.013  PHURINE 5.0  GLUCOSEU NEGATIVE  HGBUR NEGATIVE  BILIRUBINUR NEGATIVE  KETONESUR NEGATIVE  PROTEINUR NEGATIVE  NITRITE NEGATIVE  LEUKOCYTESUR NEGATIVE      Imaging: Ct Head Wo Contrast  Result Date: 10/01/2016 CLINICAL DATA:  Pt brought in by [facility] from home. Pt has hx.o liver cancer. Per EMS pt fell this morning. Pt does not remember falling. Pt family at bedside and states that pt has had increased confusion. Confusion has been an ongoing issue. EXAM: CT HEAD WITHOUT CONTRAST CT CERVICAL SPINE WITHOUT CONTRAST TECHNIQUE: Multidetector CT imaging of the head and cervical spine was performed following the standard protocol without intravenous contrast. Multiplanar CT image reconstructions of the cervical spine were also generated. COMPARISON:   None. FINDINGS: CT HEAD FINDINGS Brain: No evidence of acute infarction, hemorrhage, extra-axial collection, ventriculomegaly, or mass effect. Generalized cerebral atrophy. Periventricular white matter low attenuation likely secondary to microangiopathy. Vascular: Cerebrovascular atherosclerotic calcifications are noted. Skull: Negative for fracture or focal lesion. Sinuses/Orbits: Visualized portions of the orbits are unremarkable. Visualized portions of the paranasal sinuses and mastoid air cells are unremarkable. Other: None. CT CERVICAL SPINE FINDINGS Alignment: Normal. Skull base and vertebrae: No acute fracture. No primary bone lesion or focal pathologic process. Soft tissues and spinal canal: No prevertebral fluid or swelling. No visible canal hematoma. Disc levels: Minimal degenerative disc disease disc height loss at C5-6 and T1-2. At C3-4 there is a shallow central disc protrusion. At C4-5 there is a moderate-sized central disc protrusion abutting the ventral cervical spinal cord. At C5-6 there is a broad-based disc osteophyte complex. At C6-7 there is a mild broad-based disc bulge. Upper chest: Lung apices are clear. Other: No fluid collection or hematoma. IMPRESSION: 1. No acute intracranial pathology. 2. No acute osseous injury the cervical spine. 3. Cervical spine spondylosis as described above. At C4-5 there is a moderate-sized central disc protrusion abutting the ventral cervical spinal cord. Electronically Signed   By: Kathreen Devoid   On: 10/01/2016 12:00   Ct Cervical Spine Wo Contrast  Result Date: 10/01/2016 CLINICAL DATA:  Pt brought in by [facility] from home. Pt has hx.o liver cancer. Per EMS pt fell this morning. Pt does not remember falling. Pt family at bedside and states that pt has had increased confusion. Confusion has been an ongoing issue. EXAM: CT HEAD WITHOUT CONTRAST CT CERVICAL SPINE WITHOUT CONTRAST TECHNIQUE: Multidetector CT imaging of the head and cervical spine was performed  following the standard protocol without intravenous contrast. Multiplanar CT image reconstructions of the cervical spine were also generated. COMPARISON:  None. FINDINGS: CT HEAD FINDINGS Brain: No evidence of acute infarction, hemorrhage, extra-axial collection, ventriculomegaly, or mass effect. Generalized cerebral atrophy. Periventricular white matter low attenuation likely secondary to microangiopathy. Vascular: Cerebrovascular atherosclerotic calcifications are noted. Skull: Negative for fracture or focal lesion. Sinuses/Orbits: Visualized portions of the orbits are unremarkable. Visualized portions of the paranasal sinuses and mastoid air cells are unremarkable. Other: None. CT CERVICAL SPINE FINDINGS Alignment: Normal. Skull base and vertebrae: No acute fracture. No primary bone lesion or focal pathologic process. Soft tissues and  spinal canal: No prevertebral fluid or swelling. No visible canal hematoma. Disc levels: Minimal degenerative disc disease disc height loss at C5-6 and T1-2. At C3-4 there is a shallow central disc protrusion. At C4-5 there is a moderate-sized central disc protrusion abutting the ventral cervical spinal cord. At C5-6 there is a broad-based disc osteophyte complex. At C6-7 there is a mild broad-based disc bulge. Upper chest: Lung apices are clear. Other: No fluid collection or hematoma. IMPRESSION: 1. No acute intracranial pathology. 2. No acute osseous injury the cervical spine. 3. Cervical spine spondylosis as described above. At C4-5 there is a moderate-sized central disc protrusion abutting the ventral cervical spinal cord. Electronically Signed   By: Kathreen Devoid   On: 10/01/2016 12:00   Dg Chest Portable 1 View  Result Date: 10/01/2016 CLINICAL DATA:  History of liver carcinoma.  Fall earlier today EXAM: PORTABLE CHEST 1 VIEW COMPARISON:  June 14, 2016 FINDINGS: There is airspace consolidation in the medial left base. Lungs elsewhere are clear. Heart size and pulmonary  vascularity are normal. No adenopathy. No bone lesions. There is postoperative change in the proximal right humerus. IMPRESSION: Consolidation medial left base concerning for pneumonia. Based on appearance on prior chest radiograph, there may also be chronic atelectatic change in this area. Lungs elsewhere clear. No adenopathy. Stable cardiac silhouette. Electronically Signed   By: Lowella Grip III M.D.   On: 10/01/2016 13:21      Assessment & Plan: Ms. CHRISTYL OSENTOSKI is a 71 y.o. white female with hypertension, diabetes mellitus type II, cholangiocarcinoma, who was admitted to [facility] on 10/01/2016 for Hepatic encephalopathy ([place]) [K72.90] AKI (acute kidney injury) ([place]) [N17.9] Sepsis, due to unspecified organism ([place]) [A41.9] Pneumonia of left lower lobe due to infectious organism ([place]) [J18.1]   1. Acute Renal Failure on chronic kidney disease stage III: baseline creatinine of 1.58, GFR of 32 on 3/29. Bland urine Acute renal failure secondary to hypovolemia and prerenal azotemia. Chronic kidney disease secondary to chronic hepatorenal syndrome.  Nonoliguric urine output. No acute indication for dialysis. Discussed dialysis with family.   2. Hyponatremia: secondary to end stage liver disease. INR elevated at 1.55.   3. Anemia with chronic kidney disease: hemoglobin 9.3. Normal range platelets.   4. Hypotension: due to end stage liver disease. Takes furosemide at home. Continue to hold.   LOS: [place], [place] 4/30/20182:20 PM

## 2016-10-02 NOTE — Consult Note (Signed)
[place] NOTE  Patient Care Team: Crecencio Mc, MD as PCP - General (Internal Medicine) Robert Bellow, MD as Consulting Physician (General Surgery)  CHIEF COMPLAINTS/PURPOSE OF CONSULTATION: Cholangiocarcinoma  HISTORY OF PRESENTING ILLNESS:  Jennifer Ruiz 71 y.o.  female with a history of cholangiocarcinoma/decompensated cirrhosis- is currently admitted to the hospital after fall at home.  Cholangiocarcinoma was diagnosed approximately 1 year ago; status post Y 90- with no significant response. Most recent MRI in April 2018 at [facility]- stable/progressive disease.  Patient continues to have multiple paracentesis one almost every week; along with albumin. Patient is noted to have progressive worsening of the liver function- most recent bilirubin about 3; and also worsening kidney function the most recent creatinine on 2.5 [baseline approximately 4 months ago was 0.8].   Patient most recently had paracentesis 3 days ago. Patient has been having intermittent confusion/hepatic encephalopathy. She was most recently given lactulose. She just started to use it 3 days ago.  As per the family- patient was found sitting on the floor in the morning of April 29. Patient was confused; and then rushed to the emergency room. Further workup did not reveal an acute process so for- except for possible atelectasis/pneumonia in the lung. She was also found hypotensive; received multiple fluid boluses; and also received albumin. She is resting comfortably. No pain.  ROS: Poor appetite. No nausea vomiting. A complete 10 point review of system is done which is negative except mentioned above in history of present illness  MEDICAL HISTORY:  Past Medical History:  Diagnosis Date  . Ascites 01/09/2006   Ascites - 3L drawn off on 01/04/2016  . Breast lump in female 2014  . Cancer of kidney ([place])   . Diabetes mellitus without complication ([place])   . Elevated blood pressure reading   .  Hypertension   . Liver mass 01/10/2016   Per patient; bloating.  Mass found via [place], CT, and MRI.     SURGICAL HISTORY: Past Surgical History:  Procedure Laterality Date  . BREAST BIOPSY  1996  . BREAST BIOPSY  2014   Calcifications - [place]  . BREAST LUMPECTOMY    . SHOULDER SURGERY  2011   right , [place]     SOCIAL HISTORY: Social History   Social History  . Marital status: Divorced    Spouse name: N/A  . Number of children: N/A  . Years of education: N/A   Occupational History  . Not on file.   Social History Main Topics  . Smoking status: Never Smoker  . Smokeless tobacco: Never Used  . Alcohol use No  . Drug use: No  . Sexual activity: Not on file   Other Topics Concern  . Not on file   Social History Narrative  . No narrative on file    FAMILY HISTORY: Family History  Problem Relation Age of Onset  . Cancer Mother 25    Breast Cancer  . Breast cancer Mother   . Asthma Father   . Diabetes Father     ALLERGIES:  is allergic to adhesive [tape].  MEDICATIONS:  Current Facility-Administered Medications  Medication Dose Route Frequency Provider Last Rate Last Dose  . 0.9 %  sodium chloride infusion  250 mL Intravenous PRN Earlie Counts, NP      . acetaminophen (TYLENOL) tablet 650 mg  650 mg Oral Q6H PRN Earlie Counts, NP       Or  . acetaminophen (TYLENOL) suppository 650 mg  650 mg Rectal Q6H PRN Earlie Counts, NP      . bisacodyl (DULCOLAX) suppository 10 mg  10 mg Rectal Daily PRN Idelle Crouch, MD      . docusate sodium (COLACE) capsule 100 mg  100 mg Oral BID Idelle Crouch, MD   100 mg at 10/01/16 2120  . glycopyrrolate (ROBINUL) tablet 1 mg  1 mg Oral Q4H PRN Earlie Counts, NP       Or  . glycopyrrolate (ROBINUL) injection 0.2 mg  0.2 mg Subcutaneous Q4H PRN Earlie Counts, NP       Or  . glycopyrrolate (ROBINUL) injection 0.2 mg  0.2 mg Intravenous Q4H PRN Earlie Counts, NP      . haloperidol (HALDOL) tablet 0.5 mg  0.5 mg Oral Q4H PRN  Earlie Counts, NP       Or  . haloperidol (HALDOL) 2 MG/ML solution 0.5 mg  0.5 mg Sublingual Q4H PRN Earlie Counts, NP       Or  . haloperidol lactate (HALDOL) injection 0.5 mg  0.5 mg Intravenous Q4H PRN Earlie Counts, NP      . ipratropium-albuterol (DUONEB) 0.5-2.5 (3) MG/3ML nebulizer solution 3 mL  3 mL Nebulization Q6H PRN Wilhelmina Mcardle, MD      . LORazepam (ATIVAN) tablet 1 mg  1 mg Oral Q4H PRN Earlie Counts, NP       Or  . LORazepam (ATIVAN) 2 MG/ML concentrated solution 1 mg  1 mg Sublingual Q4H PRN Earlie Counts, NP       Or  . LORazepam (ATIVAN) injection 1 mg  1 mg Intravenous Q4H PRN Earlie Counts, NP      . ondansetron (ZOFRAN-ODT) disintegrating tablet 4 mg  4 mg Oral Q6H PRN Earlie Counts, NP       Or  . ondansetron (ZOFRAN) injection 4 mg  4 mg Intravenous Q6H PRN Earlie Counts, NP      . oxyCODONE (ROXICODONE INTENSOL) 20 MG/ML concentrated solution 5 mg  5 mg Oral Q2H PRN Earlie Counts, NP       Or  . oxyCODONE (ROXICODONE INTENSOL) 20 MG/ML concentrated solution 5 mg  5 mg Sublingual Q2H PRN Earlie Counts, NP      . pantoprazole (PROTONIX) injection 40 mg  40 mg Intravenous Q12H Idelle Crouch, MD   40 mg at 10/02/16 1010  . sodium chloride flush (NS) 0.9 % injection 3 mL  3 mL Intravenous Q12H Earlie Counts, NP   3 mL at 10/02/16 1415  . sodium chloride flush (NS) 0.9 % injection 3 mL  3 mL Intravenous PRN Earlie Counts, NP          .  PHYSICAL EXAMINATION:  Vitals:   10/02/16 1600 10/02/16 1637  BP:  (!) 98/39  Pulse: 73 71  Resp: 16 20  Temp:  97.7 F (36.5 C)   Filed Weights   10/01/16 1041 10/01/16 1505 10/02/16 0459  Weight: 195 lb 8.8 oz (88.7 kg) 185 lb 6.5 oz (84.1 kg) 200 lb 2.8 oz (90.8 kg)    GENERAL: Frail-appearing ;well-developed; Alert, no distress and comfortable.   Accompanied by her daughter. EYES: no pallor; positive for jaundice. OROPHARYNX: no thrush or ulceration. NECK: supple, no masses felt LYMPH:  no palpable  lymphadenopathy in the cervical, axillary or inguinal regions LUNGS: decreased breath sounds to auscultation at bases and  No wheeze or crackles HEART/CVS:  regular rate & rhythm and no murmurs; 3+ bilateral lower extremity edema ABDOMEN: abdomen soft, non-tender and normal bowel sounds Musculoskeletal:no cyanosis of digits and no clubbing  PSYCH: alert & oriented x 3 with fluent speech NEURO: no focal motor/sensory deficits SKIN:  Multiple ecchymosis.  LABORATORY DATA:  I have reviewed the data as listed Lab Results  Component Value Date   WBC 8.9 10/02/2016   HGB 9.3 (L) 10/02/2016   HCT 27.3 (L) 10/02/2016   MCV 90.3 10/02/2016   PLT 163 10/02/2016    Recent Labs  12/15/15 1159  09/29/16 1218 10/01/16 1053 10/01/16 1901 10/02/16 0414  NA  --   < > 130* 135 134* 133*  K  --   < > 4.9 4.4 4.2 4.2  CL  --   < > 98* 98* 105 105  CO2  --   < > 24 22 20* 20*  GLUCOSE  --   < > 98 128* 136* 102*  BUN  --   < > 83* 87* 82* 79*  CREATININE  --   < > 2.35* 2.29* 2.12* 2.12*  CALCIUM  --   < > 8.2* 8.4* 7.6* 7.3*  GFRNONAA  --   < > 20* 20* 22* 22*  GFRAA  --   < > 23* 23* 26* 26*  PROT 6.3  < > 5.6* 6.1*  --  4.1*  ALBUMIN 3.3*  < > 2.6* 3.0*  --  2.0*  AST 26  < > 31 40  --  30  ALT 10  < > 17 18  --  15  ALKPHOS 81  < > 105 93  --  66  BILITOT 1.5*  < > 3.3* 3.3*  --  2.3*  BILIDIR 0.5*  --   --   --   --   --   < > = values in this interval not displayed.  RADIOGRAPHIC STUDIES: I have personally reviewed the radiological images as listed and agreed with the findings in the report. Ct Head Wo Contrast  Result Date: 10/01/2016 CLINICAL DATA:  Pt brought in by [facility] from home. Pt has hx.o liver cancer. Per EMS pt fell this morning. Pt does not remember falling. Pt family at bedside and states that pt has had increased confusion. Confusion has been an ongoing issue. EXAM: CT HEAD WITHOUT CONTRAST CT CERVICAL SPINE WITHOUT CONTRAST TECHNIQUE: Multidetector CT imaging of the  head and cervical spine was performed following the standard protocol without intravenous contrast. Multiplanar CT image reconstructions of the cervical spine were also generated. COMPARISON:  None. FINDINGS: CT HEAD FINDINGS Brain: No evidence of acute infarction, hemorrhage, extra-axial collection, ventriculomegaly, or mass effect. Generalized cerebral atrophy. Periventricular white matter low attenuation likely secondary to microangiopathy. Vascular: Cerebrovascular atherosclerotic calcifications are noted. Skull: Negative for fracture or focal lesion. Sinuses/Orbits: Visualized portions of the orbits are unremarkable. Visualized portions of the paranasal sinuses and mastoid air cells are unremarkable. Other: None. CT CERVICAL SPINE FINDINGS Alignment: Normal. Skull base and vertebrae: No acute fracture. No primary bone lesion or focal pathologic process. Soft tissues and spinal canal: No prevertebral fluid or swelling. No visible canal hematoma. Disc levels: Minimal degenerative disc disease disc height loss at C5-6 and T1-2. At C3-4 there is a shallow central disc protrusion. At C4-5 there is a moderate-sized central disc protrusion abutting the ventral cervical spinal cord. At C5-6 there is a broad-based disc osteophyte complex. At C6-7 there is a mild broad-based disc bulge. Upper chest: Lung  apices are clear. Other: No fluid collection or hematoma. IMPRESSION: 1. No acute intracranial pathology. 2. No acute osseous injury the cervical spine. 3. Cervical spine spondylosis as described above. At C4-5 there is a moderate-sized central disc protrusion abutting the ventral cervical spinal cord. Electronically Signed   By: Kathreen Devoid   On: 10/01/2016 12:00   Ct Cervical Spine Wo Contrast  Result Date: 10/01/2016 CLINICAL DATA:  Pt brought in by [facility] from home. Pt has hx.o liver cancer. Per EMS pt fell this morning. Pt does not remember falling. Pt family at bedside and states that pt has had increased  confusion. Confusion has been an ongoing issue. EXAM: CT HEAD WITHOUT CONTRAST CT CERVICAL SPINE WITHOUT CONTRAST TECHNIQUE: Multidetector CT imaging of the head and cervical spine was performed following the standard protocol without intravenous contrast. Multiplanar CT image reconstructions of the cervical spine were also generated. COMPARISON:  None. FINDINGS: CT HEAD FINDINGS Brain: No evidence of acute infarction, hemorrhage, extra-axial collection, ventriculomegaly, or mass effect. Generalized cerebral atrophy. Periventricular white matter low attenuation likely secondary to microangiopathy. Vascular: Cerebrovascular atherosclerotic calcifications are noted. Skull: Negative for fracture or focal lesion. Sinuses/Orbits: Visualized portions of the orbits are unremarkable. Visualized portions of the paranasal sinuses and mastoid air cells are unremarkable. Other: None. CT CERVICAL SPINE FINDINGS Alignment: Normal. Skull base and vertebrae: No acute fracture. No primary bone lesion or focal pathologic process. Soft tissues and spinal canal: No prevertebral fluid or swelling. No visible canal hematoma. Disc levels: Minimal degenerative disc disease disc height loss at C5-6 and T1-2. At C3-4 there is a shallow central disc protrusion. At C4-5 there is a moderate-sized central disc protrusion abutting the ventral cervical spinal cord. At C5-6 there is a broad-based disc osteophyte complex. At C6-7 there is a mild broad-based disc bulge. Upper chest: Lung apices are clear. Other: No fluid collection or hematoma. IMPRESSION: 1. No acute intracranial pathology. 2. No acute osseous injury the cervical spine. 3. Cervical spine spondylosis as described above. At C4-5 there is a moderate-sized central disc protrusion abutting the ventral cervical spinal cord. Electronically Signed   By: Kathreen Devoid   On: 10/01/2016 12:00   US Paracentesis  Result Date: 09/29/2016 CLINICAL DATA:  [place] carcinoma with recurrent large  volume symptomatic ascites. EXAM: ULTRASOUND GUIDED PARACENTESIS TECHNIQUE: The procedure, risks (including but not limited to bleeding, infection, organ damage ), benefits, and alternatives were explained to the patient. Questions regarding the procedure were encouraged and answered. The patient understands and consents to the procedure. Survey ultrasound of the abdomen was performed and an appropriate skin entry site in the left lower abdomen was selected. Skin site was marked, prepped with chlorhexadine, draped in usual sterile fashion, and infiltrated locally with 1% lidocaine. A Safe-T-Centesis needle was advanced into the peritoneal space until fluid could be aspirated. The sheath was advanced and the needle removed. 4 L Of cloudy yellow ascites were aspirated. The patient tolerated the procedure well. Postprocedure scans show no significant residual fluid. COMPLICATIONS: COMPLICATIONS none IMPRESSION: Technically successful ultrasound guided paracentesis, removing 4 L ascites. Electronically Signed   By: Lucrezia Europe M.D.   On: 09/29/2016 15:11   US Paracentesis  Result Date: 09/20/2016 INDICATION: History of cholangiocarcinoma, now with recurrent symptomatic ascites. Please perform ultrasound-guided paracentesis for therapeutic purposes (paracentesis max is 4 L). EXAM: ULTRASOUND-GUIDED PARACENTESIS COMPARISON:  Ultrasound-guided paracentesis - 09/06/2016; 09/01/2016; 08/24/2016 MEDICATIONS: None. COMPLICATIONS: None immediate. TECHNIQUE: Informed written consent was obtained from the patient after a discussion of  the risks, benefits and alternatives to treatment. A timeout was performed prior to the initiation of the procedure. Initial ultrasound scanning demonstrates a moderate to large amount of ascites within the right lower abdominal quadrant. The right lower abdomen was prepped and draped in the usual sterile fashion. 1% lidocaine with epinephrine was used for local anesthesia. An ultrasound image  was saved for documentation purposed. An 8 Fr Safe-T-Centesis catheter was introduced. The paracentesis was performed. The catheter was removed and a dressing was applied. The patient tolerated the procedure well without immediate post procedural complication. FINDINGS: A total of approximately 4 liters of serous fluid was removed. IMPRESSION: Successful ultrasound-guided paracentesis yielding 4 liters of peritoneal fluid. Electronically Signed   By: Sandi Mariscal M.D.   On: 09/20/2016 15:30   US Paracentesis  Result Date: 09/06/2016 INDICATION: Recurrent ascites.  Cholangiocarcinoma. EXAM: ULTRASOUND GUIDED PARACENTESIS MEDICATIONS: None. COMPLICATIONS: None immediate. PROCEDURE: Informed written consent was obtained from the patient after a discussion of the risks, benefits and alternatives to treatment. A timeout was performed prior to the initiation of the procedure. Initial ultrasound scanning demonstrates a large amount of ascites within the left lower abdominal quadrant. The left lower abdomen was prepped and draped in the usual sterile fashion. 1% lidocaine was used for local anesthesia. Following this, a 6 Fr Safe-T-Centesis catheter was introduced. An ultrasound image was saved for documentation purposes. The paracentesis was performed. The catheter was removed and a dressing was applied. The patient tolerated the procedure well without immediate post procedural complication. FINDINGS: A total of approximately 3.8 L of milky yellow fluid was removed. IMPRESSION: Successful ultrasound-guided paracentesis yielding 3.8 liters of peritoneal fluid. Electronically Signed   By: Markus Daft M.D.   On: 09/06/2016 18:37   Dg Chest Portable 1 View  Result Date: 10/01/2016 CLINICAL DATA:  History of liver carcinoma.  Fall earlier today EXAM: PORTABLE CHEST 1 VIEW COMPARISON:  June 14, 2016 FINDINGS: There is airspace consolidation in the medial left base. Lungs elsewhere are clear. Heart size and pulmonary  vascularity are normal. No adenopathy. No bone lesions. There is postoperative change in the proximal right humerus. IMPRESSION: Consolidation medial left base concerning for pneumonia. Based on appearance on prior chest radiograph, there may also be chronic atelectatic change in this area. Lungs elsewhere clear. No adenopathy. Stable cardiac silhouette. Electronically Signed   By: Lowella Grip III M.D.   On: 10/01/2016 13:21    ASSESSMENT & PLAN:   71 year old female patient with cholangiocarcinoma/decompensated cirrhosis- currently admitted to hospital for generalized weakness/fall  # Cholangiocarcinoma/the context of decompensated cirrhosis. Status post Y 90 [oct 30th 2017]. April 2nd 2018 MRI at [facility] response/slight increase in size of liver lesion; Patient not a candidate for chemotherapy at this time.   #  Decompensated cirrhosis /Recurrent  Large volume ascites- currently no paracentesis needed; hepatic encephalopathy.  # Hypotension/ ARF on CKD- less likely sepsis; likely secondary to intravascular volume depletion; on albumin. Appreciate nephrology evaluation.  # Long discussion the patient and daughter- regarding the poor prognosis again. Recommend palliative care evaluation. Discussed with palliative care nurse.  Discussed with Dr. Posey Pronto.  # Thank you Dr. Posey Pronto for allowing me to participate in the care of your pleasant patient. Please do not hesitate to contact me with questions or concerns in the interim.    Cammie Sickle, MD 10/02/2016 4:55 PM

## 2016-10-02 NOTE — Progress Notes (Signed)
Palliative care met with patient and patient's daughter and it was decided  patient will be transferred to hospice facility. spoke with hospice RN Marcene Brawn and there is a bed available. She will meet with patient and family and if they want we will transfer her to the hospice facility today.

## 2016-10-02 NOTE — Progress Notes (Signed)
[place] [facility] [place] NAME: Jennifer Ruiz    MR#:  [id number]  DATE OF BIRTH:  [birth year]  SUBJECTIVE:  Patient's feels very fatigued and tired. Daughter and friend in the room. She came in after she had a fall at home found to have pneumonia on chest x-ray No fever. Poor appetite.  REVIEW OF SYSTEMS:   Review of Systems  Constitutional: Positive for malaise/fatigue. Negative for chills, fever and weight loss.  HENT: Negative for ear discharge, ear pain and nosebleeds.   Eyes: Negative for blurred vision, pain and discharge.  Respiratory: Negative for sputum production, shortness of breath, wheezing and stridor.   Cardiovascular: Negative for chest pain, palpitations, orthopnea and PND.  Gastrointestinal: Negative for abdominal pain, diarrhea, nausea and vomiting.  Genitourinary: Negative for frequency and urgency.  Musculoskeletal: Negative for back pain and joint pain.  Neurological: Positive for weakness. Negative for sensory change, speech change and focal weakness.  Psychiatric/Behavioral: Negative for depression and hallucinations. The patient is not nervous/anxious.    Tolerating Diet:poor appetite Tolerating PT: pending  DRUG ALLERGIES:   Allergies  Allergen Reactions  . Adhesive [Tape] Other (See Comments)    Blistering and redness    VITALS:  Blood pressure (!) 88/42, pulse 69, temperature 97.9 F (36.6 C), temperature source Core (Comment), resp. rate 14, height 5\' 4"  (1.626 m), weight 90.8 kg (200 lb 2.8 oz), SpO2 98 %.  PHYSICAL EXAMINATION:   Physical Exam  GENERAL:  71 y.o.-year-old patient lying in the bed with no acute distress. Appears weak and tired EYES: Pupils equal, round, reactive to light and accommodation. + scleral icterus. Extraocular muscles intact.  HEENT: Head atraumatic, normocephalic. Oropharynx and nasopharynx clear.  NECK:  Supple, no jugular venous distention. No thyroid enlargement, no  tenderness.  LUNGS: Normal breath sounds bilaterally, no wheezing, rales, rhonchi. No use of accessory muscles of respiration.  CARDIOVASCULAR: S1, S2 normal. No murmurs, rubs, or gallops.  ABDOMEN: Soft, nontender, nondistended. Bowel sounds present. No organomegaly or mass EXTREMITIES: No cyanosis, clubbing or edema b/l.    NEUROLOGIC: Cranial nerves II through XII are intact. No focal Motor or sensory deficits b/l.   PSYCHIATRIC:  patient is alert and oriented x 3.  SKIN: No obvious rash, lesion, or ulcer.   LABORATORY PANEL:  CBC  Recent Labs Lab 10/02/16 0414  WBC 8.9  HGB 9.3*  HCT 27.3*  PLT 163    Chemistries   Recent Labs Lab 10/01/16 1901 10/02/16 0414  NA 134* 133*  K 4.2 4.2  CL 105 105  CO2 20* 20*  GLUCOSE 136* 102*  BUN 82* 79*  CREATININE 2.12* 2.12*  CALCIUM 7.6* 7.3*  MG 2.6*  --   AST  --  30  ALT  --  15  ALKPHOS  --  66  BILITOT  --  2.3*   Cardiac Enzymes  Recent Labs Lab 10/01/16 1901  TROPONINI <0.03   RADIOLOGY:  Ct Head Wo Contrast  Result Date: 10/01/2016 CLINICAL DATA:  Pt brought in by [facility] from home. Pt has hx.o liver cancer. Per EMS pt fell this morning. Pt does not remember falling. Pt family at bedside and states that pt has had increased confusion. Confusion has been an ongoing issue. EXAM: CT HEAD WITHOUT CONTRAST CT CERVICAL SPINE WITHOUT CONTRAST TECHNIQUE: Multidetector CT imaging of the head and cervical spine was performed following the standard protocol without intravenous contrast. Multiplanar CT image reconstructions of the cervical spine were  also generated. COMPARISON:  None. FINDINGS: CT HEAD FINDINGS Brain: No evidence of acute infarction, hemorrhage, extra-axial collection, ventriculomegaly, or mass effect. Generalized cerebral atrophy. Periventricular white matter low attenuation likely secondary to microangiopathy. Vascular: Cerebrovascular atherosclerotic calcifications are noted. Skull: Negative for fracture or  focal lesion. Sinuses/Orbits: Visualized portions of the orbits are unremarkable. Visualized portions of the paranasal sinuses and mastoid air cells are unremarkable. Other: None. CT CERVICAL SPINE FINDINGS Alignment: Normal. Skull base and vertebrae: No acute fracture. No primary bone lesion or focal pathologic process. Soft tissues and spinal canal: No prevertebral fluid or swelling. No visible canal hematoma. Disc levels: Minimal degenerative disc disease disc height loss at C5-6 and T1-2. At C3-4 there is a shallow central disc protrusion. At C4-5 there is a moderate-sized central disc protrusion abutting the ventral cervical spinal cord. At C5-6 there is a broad-based disc osteophyte complex. At C6-7 there is a mild broad-based disc bulge. Upper chest: Lung apices are clear. Other: No fluid collection or hematoma. IMPRESSION: 1. No acute intracranial pathology. 2. No acute osseous injury the cervical spine. 3. Cervical spine spondylosis as described above. At C4-5 there is a moderate-sized central disc protrusion abutting the ventral cervical spinal cord. Electronically Signed   By: Kathreen Devoid   On: 10/01/2016 12:00   Ct Cervical Spine Wo Contrast  Result Date: 10/01/2016 CLINICAL DATA:  Pt brought in by [facility] from home. Pt has hx.o liver cancer. Per EMS pt fell this morning. Pt does not remember falling. Pt family at bedside and states that pt has had increased confusion. Confusion has been an ongoing issue. EXAM: CT HEAD WITHOUT CONTRAST CT CERVICAL SPINE WITHOUT CONTRAST TECHNIQUE: Multidetector CT imaging of the head and cervical spine was performed following the standard protocol without intravenous contrast. Multiplanar CT image reconstructions of the cervical spine were also generated. COMPARISON:  None. FINDINGS: CT HEAD FINDINGS Brain: No evidence of acute infarction, hemorrhage, extra-axial collection, ventriculomegaly, or mass effect. Generalized cerebral atrophy. Periventricular white matter  low attenuation likely secondary to microangiopathy. Vascular: Cerebrovascular atherosclerotic calcifications are noted. Skull: Negative for fracture or focal lesion. Sinuses/Orbits: Visualized portions of the orbits are unremarkable. Visualized portions of the paranasal sinuses and mastoid air cells are unremarkable. Other: None. CT CERVICAL SPINE FINDINGS Alignment: Normal. Skull base and vertebrae: No acute fracture. No primary bone lesion or focal pathologic process. Soft tissues and spinal canal: No prevertebral fluid or swelling. No visible canal hematoma. Disc levels: Minimal degenerative disc disease disc height loss at C5-6 and T1-2. At C3-4 there is a shallow central disc protrusion. At C4-5 there is a moderate-sized central disc protrusion abutting the ventral cervical spinal cord. At C5-6 there is a broad-based disc osteophyte complex. At C6-7 there is a mild broad-based disc bulge. Upper chest: Lung apices are clear. Other: No fluid collection or hematoma. IMPRESSION: 1. No acute intracranial pathology. 2. No acute osseous injury the cervical spine. 3. Cervical spine spondylosis as described above. At C4-5 there is a moderate-sized central disc protrusion abutting the ventral cervical spinal cord. Electronically Signed   By: Kathreen Devoid   On: 10/01/2016 12:00   Dg Chest Portable 1 View  Result Date: 10/01/2016 CLINICAL DATA:  History of liver carcinoma.  Fall earlier today EXAM: PORTABLE CHEST 1 VIEW COMPARISON:  June 14, 2016 FINDINGS: There is airspace consolidation in the medial left base. Lungs elsewhere are clear. Heart size and pulmonary vascularity are normal. No adenopathy. No bone lesions. There is postoperative change in the proximal  right humerus. IMPRESSION: Consolidation medial left base concerning for pneumonia. Based on appearance on prior chest radiograph, there may also be chronic atelectatic change in this area. Lungs elsewhere clear. No adenopathy. Stable cardiac silhouette.  Electronically Signed   By: Lowella Grip III M.D.   On: 10/01/2016 13:21   ASSESSMENT AND PLAN:  Jennifer Ruiz is a 72 y.o. female has a past medical history significant for HTN, DM, and cholangiocarcinoma who presents after falling in her laundry room this AM due to profound weakness. In ER, pt was hypotensive and hypothermic. WBC and lactic acid elevated. CXR reveals LLL consolidation c/w pneumonia  1. Sepsis ([place]) due to left lower lobe consolidation -Elevated lactic acid and procalcitonin -IV Zosyn -DC'd vancomycin MRSA PCR negative -Follow up white count -Follow blood cultures  2. Acute on chronic renal failure -Patient has CK D-III Baseline creatinine around 1.4-1.5 -Presents with creatinine of 2.12--- receiving IV fluids and IV albumin -Nephrology consulted -Monitor I's and O's avoid nephrotoxins  3.   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 Recurrent ascites patient recently had paracentesis on 09/29/2016 as outpatient    5. Hyponatremia suspected due to poor by mouth intake  Spoke at length with patient's daughter. They are going to be meeting with palliative care this afternoon.   Case discussed with Care Management/Social Worker. Management plans discussed with the patient, family and they are in agreement.  CODE STATUS: DNR  DVT Prophylaxis: scd  TOTAL TIME TAKING CARE OF THIS PATIENT: *30* minutes.  >50% time spent on counselling and coordination of care  POSSIBLE D/C IN 2-3 DAYS, DEPENDING ON CLINICAL CONDITION.  Note: This dictation was prepared with Dragon dictation along with smaller phrase technology. Any transcriptional errors that result from this process are unintentional.  Libbey Duce M.D on 10/02/2016 at 2:07 PM  Between 7am to 6pm - Pager - [phone]  After 6pm go to [email or web address] - password [email or web address]  Office  [phone]  CC: Primary care physician; Crecencio Mc, MD

## 2016-10-02 NOTE — Consult Note (Signed)
New referral for the hospice home on 71yo female who was admitted to [facility] on 4.29.18 with pneumonia.  Patient is post fall in the home.  Past history of HTN, DM, stage IV cholangiocarcinoma, hepatic encephalopathy and CKD III. Patient is not requiring any medications at this time for symptoms.  Patient is post palliative medicine consult where the patient and her daughter decided on full comfort measures as no further treatment options available.  Patient and family are post palliative medicine consult to discuss goals of care.  Patient request to transition to full comfort measures and states to the NP she is tired and read to go.  Daughter is supportive and honoring her mother's wishes and request hospice consult.  The patient is resting with her eyes closed upon my visit, tries to open her eyes but unable to keep her eyes open.  Daughter states she was awake this morning, but has not been able to respond this afternoon.  Met with the patient's daughter Larene Beach, her husband Konrad Dolores and [name]'s friend- Eustaquio Maize to discuss hospice services.  Brien Mates RN from [facility] also present during the discussion).  Hospice home criteria and hospice philosophy discussed with them.  All are in agreement with and request transfer to the hospice home.  They are aware that transport will be available tomorrow and that the patient will be moved from [place] to 105.  Consents signed.  Support provided to the family with time spend answering their questions and concerns.  Patient was actually transferred to room 105 during the end of our conversation.  Larene Beach was provided with hospice folder that included copies of the hospice consents.  Information faxed to referral intake.  Will continue to follow through final disposition.  Thank you for allowing participation in this patient's care.   Dimas Aguas, RN Clinical Nurse Liaison [phone]

## 2016-10-02 NOTE — Consult Note (Signed)
Consultation Note Date: 10/02/2016   Patient Name: Jennifer Ruiz  DOB: 10-12-1945  MRN: [id number]  Age / Sex: 71 y.o., female  PCP: Jennifer Mc, MD Referring Physician: Fritzi Mandes, MD  Reason for Consultation: Establishing goals of care, Psychosocial/spiritual support and Terminal Care  HPI/Patient Profile: 71 y.o. female  with past medical history of HTN, DM, cholangiocarcinoma (Stage IV with refractory ascites, hepatic encephalopathy, no further treatment planned- was to see Hospice outpatient), CKD (stage III)  admitted on 10/01/2016 from home due to a fall. Workup so far has revealed pneumonia on chest xray. She is in hepatorenal failure, hypotensive- requiring IV fluid resuscitation, prognosis is poor. Palliative medicine consulted for Jennifer Ruiz.    Clinical Assessment and Goals of Care: Met with family - daughter, Jennifer Beach- [name]'s husband, and patient's lifelong friend- Jennifer Ruiz. They had just met with patient's oncologist and with the nephrologist.  They tell me prior to admission she has been declining significantly over the last few weeks. She has stopped eating and has been getting confused. They have seen her functional status decline to a point where they are certain she could no longer care for herself at home.  After discussing her prognosis in regards to her cancer (she is now in hepatorenal failure, no further treatment is planned) they are very clear in the fact that the patient's GOC are to be comfortable and not to continue any life prolonging measures including IV fluids or IV antibiotics. They and the patient feel this would prolong suffering. They state that the patient has been saying for several weeks, "I'm tired, I'm ready to go".  The patient's mother received care at the [facility] and they would like the patient to go there as well if a bed is  available.  Evaluated patient. She was lethargic. Opened eyes briefly to voice. Gently let her know of discussion with family. She smiled and nodded in agreement when asked if this is what she would like to do. She denied discomfort and appeared in no apparent distress.    Primary Decision Maker NEXT OF KIN - patient's daughter (and HCPOA) - Jennifer Ruiz    SUMMARY OF RECOMMENDATIONS -Transition care to comfort measures -Referral to social work for Residential Hospice Placement    Code Status/Advance Care Planning:  DNR  Palliative Prophylaxis:   Frequent Pain Assessment  Additional Recommendations (Limitations, Scope, Preferences):  Full Comfort Care  Psycho-social/Spiritual:   Desire for further support: Declined  Prognosis:    < 2 weeks d/t patient lethargic, not eating or drinking, pneumonia, hepatorenal failure with transition of care to full comfort measures only  Discharge Planning: Hospice facility  Primary Diagnoses: Present on Admission: . 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 The following aspects are pertinent.  Past Medical History:  Diagnosis Date  . Ascites 01/09/2006   Ascites - 3L drawn off on 01/04/2016  . Breast lump in female 2014  .  Cancer of kidney ([place])   . Diabetes mellitus without complication ([place])   . Elevated blood pressure reading   . Hypertension   . Liver mass 01/10/2016   Per patient; bloating.  Mass found via [place], CT, and MRI.    Social History   Social History  . Marital status: Divorced    Spouse name: N/A  . Number of children: N/A  . Years of education: N/A   Social History Main Topics  . Smoking status: Never Smoker  . Smokeless tobacco: Never Used  . Alcohol use No  . Drug use: No  . Sexual activity: Not Asked   Other Topics Concern  . None   Social History Narrative  . None   Family History  Problem Relation  Age of Onset  . Cancer Mother 41    Breast Cancer  . Breast cancer Mother   . Asthma Father   . Diabetes Father    Scheduled Meds: . docusate sodium  100 mg Oral BID  . [START ON 10/06/2016] ergocalciferol  50,000 Units Oral Weekly  . insulin aspart  0-9 Units Subcutaneous TID WC  . lactulose  30 g Oral BID  . pantoprazole (PROTONIX) IV  40 mg Intravenous Q12H  . sodium chloride flush  3 mL Intravenous Q12H   Continuous Infusions: . sodium chloride 50 mL/hr at 10/02/16 1200  . piperacillin-tazobactam (ZOSYN)  IV Stopped (10/02/16 1021)   PRN Meds:.bisacodyl, ipratropium-albuterol, ondansetron **OR** ondansetron (ZOFRAN) IV Medications Prior to Admission:  Prior to Admission medications   Medication Sig Start Date End Date Taking? Authorizing Provider  ergocalciferol (VITAMIN D2) 50000 units capsule Take 1 capsule (50,000 Units total) by mouth once a week. 07/27/16  Yes Jennifer Sickle, MD  furosemide (LASIX) 20 MG tablet TAKE 2 TABLETS BY MOUTH DAILY Patient taking differently: TAKE 1 tablet bid 08/17/16  Yes Jennifer Sickle, MD  lactulose, encephalopathy, (CHRONULAC) 10 GM/15ML SOLN Take 45 mLs (30 g total) by mouth 2 (two) times daily. 09/29/16  Yes Jennifer Sickle, MD  ondansetron (ZOFRAN) 8 MG tablet Take 1 tablet by mouth every 8 (eight) hours as needed for nausea/vomiting. 09/23/16 10/23/16 Yes Historical Provider, MD  fluticasone (FLONASE) 50 MCG/ACT nasal spray Place 2 sprays into both nostrils daily. Patient not taking: Reported on 10/01/2016 08/30/16   Jennifer Starks, PA-C   Allergies  Allergen Reactions  . Adhesive [Tape] Other (See Comments)    Blistering and redness   Review of Systems  Unable to perform ROS   Physical Exam  Constitutional:  frail  Eyes: Scleral icterus is present.  Cardiovascular: Normal rate and regular rhythm.   Pulmonary/Chest: Effort normal.  Abdominal: She exhibits distension.  Neurological:  lethargic  Skin:  jaundice   Nursing note and vitals reviewed.   Vital Signs: BP (!) 88/42 (BP Location: Right Arm)   Pulse 69   Temp 97.9 F (36.6 C) (Core (Comment))   Resp 14   Ht '5\' 4"'  (1.626 m)   Wt 90.8 kg (200 lb 2.8 oz)   SpO2 98%   BMI 34.36 kg/m  Pain Assessment: 0-10 POSS *See Group Information*: 1-Acceptable,Awake and alert Pain Score: Asleep   SpO2: SpO2: 98 % O2 Device:SpO2: 98 % O2 Flow Rate: .   IO: Intake/output summary:  Intake/Output Summary (Last 24 hours) at 10/02/16 1403 Last data filed at 10/02/16 1200  Gross per 24 hour  Intake          5461.83 ml  Output  895 ml  Net          4566.83 ml    LBM: Last BM Date:  (PTA) Baseline Weight: Weight: 88.7 kg (195 lb 8.8 oz) Most recent weight: Weight: 90.8 kg (200 lb 2.8 oz)     Palliative Assessment/Data:   Flowsheet Rows     Most Recent Value  Intake Tab  Referral Department  Hospitalist  Unit at Time of Referral  ICU  Palliative Care Primary Diagnosis  Sepsis/Infectious Disease  Date Notified  10/01/16  Palliative Care Type  New Palliative care  Reason for referral  Clarify Goals of Care  Date of Admission  10/01/16  Date first seen by Palliative Care  10/02/16  # of days Palliative referral response time  1 Day(s)  # of days IP prior to Palliative referral  0  Clinical Assessment  Psychosocial & Spiritual Assessment  Palliative Care Outcomes      Thank you for this consult. Palliative medicine will continue to follow and assist as needed.   Time In: 1300 Time Out: 1440 Time Total: 80 minutes Greater than 50%  of this time was spent counseling and coordinating care related to the above assessment and plan.  Signed by: Mariana Kaufman, AGNP-C Palliative Medicine    Please contact Palliative Medicine Team phone at [phone] for questions and concerns.  For individual provider: See Shea Evans

## 2016-10-02 NOTE — Progress Notes (Signed)
Pt was alert and oriented upon shift assessment; however, pt has become more obtunded since. Lung sounds clear/diminished to auscultation. SpO2 < 90% on RA. NSR on cardiac monitor. Pt is now alert to voice and pain. Pt was able to eat lunch this afternoon with assistance, 25%/123ml. Family has been updated and supportive at bedside. Pt with orders to transfer to floor.

## 2016-10-02 NOTE — Progress Notes (Signed)
eLink Physician-Brief Progress Note Patient Name: Jennifer Ruiz DOB: 1946/02/02 MRN: [id number]   Date of Service  10/02/2016  HPI/Events of Note  Hypotensive.  eICU Interventions  Will give albumin.     Intervention Category Major Interventions: Other:  Christinea Brizuela 10/02/2016, 6:19 AM

## 2016-10-02 NOTE — Care Management (Signed)
RNCM received call from Palliative team requesting assistance with transfer to hospice home. I have notified CSW and she will assist.

## 2016-10-02 NOTE — Progress Notes (Signed)
Pleasant, slightly confused. No distress. No complaints  Vitals:   10/02/16 0900 10/02/16 1000 10/02/16 1100 10/02/16 1200  BP: (!) 92/40 (!) 87/40 (!) 88/39 (!) 88/42  Pulse: 87 82 80 69  Resp: 18 14 17 14   Temp: 98.2 F (36.8 C) 98.2 F (36.8 C) 98.1 F (36.7 C) 97.9 F (36.6 C)  TempSrc:    Core (Comment)  SpO2: 98% 99% 98% 98%  Weight:      Height:       NAD HEENT WNL Chest clear anteriorly Reg, no M Abd slightly distended, NT Ext warm with numerous LE ecchymoses No focal neurological deficits  BMP Latest Ref Rng & Units 10/02/2016 10/01/2016 10/01/2016  Glucose 65 - 99 mg/dL 102(H) 136(H) 128(H)  BUN 6 - 20 mg/dL 79(H) 82(H) 87(H)  Creatinine 0.44 - 1.00 mg/dL 2.12(H) 2.12(H) 2.29(H)  Sodium 135 - 145 mmol/L 133(L) 134(L) 135  Potassium 3.5 - 5.1 mmol/L 4.2 4.2 4.4  Chloride 101 - 111 mmol/L 105 105 98(L)  CO2 22 - 32 mmol/L 20(L) 20(L) 22  Calcium 8.9 - 10.3 mg/dL 7.3(L) 7.6(L) 8.4(L)   CBC Latest Ref Rng & Units 10/02/2016 10/01/2016 09/29/2016  WBC 3.6 - 11.0 K/uL 8.9 17.3(H) 9.8  Hemoglobin 12.0 - 16.0 g/dL 9.3(L) 12.8 11.6(L)  Hematocrit 35.0 - 47.0 % 27.3(L) 37.8 34.0(L)  Platelets 150 - 440 K/uL 163 225 204   CXR: minimal hazy opacity in L bases  IMPRESSION: Cholangiocarcinoma Suspected LLL PNA Hypotension - asymptomatic AKI/CKD with Cr stabilized over past 24 hrs DM 2 - controlled Mild confusion Elevated ammonia level DNR  PLAN/REC: Albumin 12.5 gm ordered Cont NS until meeting needs by mouth Advance diet Cont SSI DC Vanc. Cont pip-tazo for now Cont lactulose Possible transfer out of ICU if BP stabilizes (MAP > 55 mmHg)  Merton Border, MD PCCM service Mobile [phone] Pager [phone] 10/02/2016 1:55 PM

## 2016-10-03 LAB — URINE CULTURE

## 2016-10-03 NOTE — Progress Notes (Signed)
Follow up visit to new hopsice home referral. Patient seen lying in bed, eyes closed, easily awakened by voice, son in [place] and friend Eustaquio Maize present. Confirmed plan for discharge to the hospice home. Discharge summary faxed to referral. Report called to the hospice home, EMS notified for transport. Signed DNR in place in patient's discharge packet. Hospital care team and family all aware. Thank you. Flo Shanks RN, BSN, [facility] [place], hospital liaison [phone] c

## 2016-10-03 NOTE — Discharge Summary (Signed)
[facility] NAME: Jennifer Ruiz    MR#:  [id number]  DATE OF BIRTH:  1945/10/10  DATE OF ADMISSION:  10/01/2016 ADMITTING PHYSICIAN: Idelle Crouch, MD  DATE OF DISCHARGE: 10/03/2016  PRIMARY CARE PHYSICIAN: Crecencio Mc, MD    ADMISSION DIAGNOSIS:  Hepatic encephalopathy (HCC) [K72.90] AKI (acute kidney injury) ([place]) [N17.9] Sepsis, due to unspecified organism ([place]) [A41.9] Pneumonia of left lower lobe due to infectious organism ([place]) [J18.1]  DISCHARGE DIAGNOSIS:  Sepsis due to pneumonia Cholangiocarcinoma Failure to thrive Recurrent ascites  SECONDARY DIAGNOSIS:   Past Medical History:  Diagnosis Date  . Ascites 01/09/2006   Ascites - 3L drawn off on 01/04/2016  . Breast lump in female 2014  . Cancer of kidney ([place])   . Diabetes mellitus without complication ([place])   . Elevated blood pressure reading   . Hypertension   . Liver mass 01/10/2016   Per patient; bloating.  Mass found via [place], CT, and MRI.     HOSPITAL COURSE:   Jennifer Peitz Sizemoreis a 71 y.o.femalehas a past medical history significant for HTN, DM, and cholangiocarcinoma who presents after falling in her laundry room this AM due to profound weakness. In ER, pt was hypotensive and hypothermic. WBC and lactic acid elevated. CXR reveals LLL consolidation c/w pneumonia  1. Sepsis ([place]) due to left lower lobe consolidation -Elevated lactic acid and procalcitonin -received IV Zosyn -DC'd vancomycin MRSA PCR negative  2. Acute on chronic renal failure -Patient has CK D-III Baseline creatinine around 1.4-1.5 -Presents with creatinine of 2.12--- received IV fluids and IV albumin  3. 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 Recurrent ascites patient recently had paracentesis on 09/29/2016 as outpatient   5. Hyponatremia suspected due to poor by mouth intake  Family and pt decided for comfort measures only.  Pt is DNR  Will  get her to Hospice home today.  CONSULTS OBTAINED:  Treatment Team:  Cammie Sickle, MD Lavonia Dana, MD  DRUG ALLERGIES:   Allergies  Allergen Reactions  . Adhesive [Tape] Other (See Comments)    Blistering and redness    DISCHARGE MEDICATIONS:   Current Discharge Medication List    START taking these medications   Details  glycopyrrolate (ROBINUL) 1 MG tablet Take 1 tablet (1 mg total) by mouth every 4 (four) hours as needed (excessive secretions). Qty: 30 tablet, Refills: 0    haloperidol (HALDOL) 0.5 MG tablet Take 1 tablet (0.5 mg total) by mouth every 4 (four) hours as needed for agitation (or delirium). Qty: 30 tablet, Refills: 0    LORazepam (ATIVAN) 1 MG tablet Take 1 tablet (1 mg total) by mouth every 4 (four) hours as needed for anxiety. Qty: 30 tablet, Refills: 0    oxyCODONE (ROXICODONE INTENSOL) 20 MG/ML concentrated solution Take 0.3 mLs (6 mg total) by mouth every 2 (two) hours as needed for moderate pain (or dyspnea). Qty: 30 mL, Refills: 0      CONTINUE these medications which have NOT CHANGED   Details  ondansetron (ZOFRAN) 8 MG tablet Take 1 tablet by mouth every 8 (eight) hours as needed for nausea/vomiting.    fluticasone (FLONASE) 50 MCG/ACT nasal spray Place 2 sprays into both nostrils daily. Qty: 16 g, Refills: 6      STOP taking these medications     ergocalciferol (VITAMIN D2) 50000 units capsule      furosemide (LASIX) 20 MG tablet  lactulose, encephalopathy, ([place]) 10 GM/15ML SOLN         If you experience worsening of your admission symptoms, develop shortness of breath, life threatening emergency, suicidal or homicidal thoughts you must seek medical attention immediately by calling 911 or calling your MD immediately  if symptoms less severe.  You Must read complete instructions/literature along with all the possible adverse reactions/side effects for all the Medicines you take and that have been prescribed to you.  Take any new Medicines after you have completely understood and accept all the possible adverse reactions/side effects.   Please note  You were cared for by a hospitalist during your hospital stay. If you have any questions about your discharge medications or the care you received while you were in the hospital after you are discharged, you can call the unit and asked to speak with the hospitalist on call if the hospitalist that took care of you is not available. Once you are discharged, your primary care physician will handle any further medical issues. Please note that NO REFILLS for any discharge medications will be authorized once you are discharged, as it is imperative that you return to your primary care physician (or establish a relationship with a primary care physician if you do not have one) for your aftercare needs so that they can reassess your need for medications and monitor your lab values. Today   SUBJECTIVE   Rested well in the night. Family at bedside  VITAL SIGNS:  Blood pressure (!) 98/39, pulse 71, temperature 97.7 F (36.5 C), temperature source Oral, resp. rate 20, height 5\' 4"  (1.626 m), weight 90.8 kg (200 lb 2.8 oz), SpO2 99 %.  I/O:   Intake/Output Summary (Last 24 hours) at 10/03/16 0829 Last data filed at 10/02/16 1700  Gross per 24 hour  Intake            568.5 ml  Output                0 ml  Net            568.5 ml    PHYSICAL EXAMINATION:  GENERAL:  71 y.o.-year-old patient lying in the bed with no acute distress.looks ill  EYES: Pupils equal, round, reactive to light and accommodation. No scleral icterus. Extraocular muscles intact.  HEENT: Head atraumatic, normocephalic. Oropharynx and nasopharynx clear. Dry mucosa  NECK:  Supple, no jugular venous distention. No thyroid enlargement, no tenderness.  LUNGS: Normal breath sounds bilaterally, no wheezing, rales,rhonchi or crepitation. No use of accessory muscles of respiration.  CARDIOVASCULAR: S1, S2  normal. No murmurs, rubs, or gallops.  ABDOMEN: Soft, non-tender, non-distended. Bowel sounds present. No organomegaly or mass.  EXTREMITIES: No pedal edema, cyanosis, or clubbing.  NEUROLOGIC: comfort care PSYCHIATRIC: The patient is alert and oriented x 3.  SKIN: No obvious rash, lesion, or ulcer.   DATA REVIEW:   CBC   Recent Labs Lab 10/02/16 0414  WBC 8.9  HGB 9.3*  HCT 27.3*  PLT 163    Chemistries   Recent Labs Lab 10/01/16 1901 10/02/16 0414  NA 134* 133*  K 4.2 4.2  CL 105 105  CO2 20* 20*  GLUCOSE 136* 102*  BUN 82* 79*  CREATININE 2.12* 2.12*  CALCIUM 7.6* 7.3*  MG 2.6*  --   AST  --  30  ALT  --  15  ALKPHOS  --  80  BILITOT  --  2.3*    Microbiology Results   Recent Results (from  the past 240 hour(s))  Culture, blood (Routine x 2)     Status: None (Preliminary result)   Collection Time: 10/01/16 10:53 AM  Result Value Ref Range Status   Specimen Description BLOOD Blood Culture adequate volume  Final   Special Requests   Final    BOTTLES DRAWN AEROBIC AND ANAEROBIC LEFT ANTECUBITAL   Culture NO GROWTH 2 DAYS  Final   Report Status PENDING  Incomplete  Culture, blood (Routine x 2)     Status: None (Preliminary result)   Collection Time: 10/01/16 10:53 AM  Result Value Ref Range Status   Specimen Description BLOOD Blood Culture adequate volume  Final   Special Requests   Final    BOTTLES DRAWN AEROBIC AND ANAEROBIC BLOOD RIGHT ARM   Culture NO GROWTH 2 DAYS  Final   Report Status PENDING  Incomplete  MRSA PCR Screening     Status: None   Collection Time: 10/01/16  2:59 PM  Result Value Ref Range Status   MRSA by PCR NEGATIVE NEGATIVE Final    Comment:        The GeneXpert MRSA Assay (FDA approved for NASAL specimens only), is one component of a comprehensive MRSA colonization surveillance program. It is not intended to diagnose MRSA infection nor to guide or monitor treatment for MRSA infections.     RADIOLOGY:  Ct Head Wo  Contrast  Result Date: 10/01/2016 CLINICAL DATA:  Pt brought in by [facility] from home. Pt has hx.o liver cancer. Per EMS pt fell this morning. Pt does not remember falling. Pt family at bedside and states that pt has had increased confusion. Confusion has been an ongoing issue. EXAM: CT HEAD WITHOUT CONTRAST CT CERVICAL SPINE WITHOUT CONTRAST TECHNIQUE: Multidetector CT imaging of the head and cervical spine was performed following the standard protocol without intravenous contrast. Multiplanar CT image reconstructions of the cervical spine were also generated. COMPARISON:  None. FINDINGS: CT HEAD FINDINGS Brain: No evidence of acute infarction, hemorrhage, extra-axial collection, ventriculomegaly, or mass effect. Generalized cerebral atrophy. Periventricular white matter low attenuation likely secondary to microangiopathy. Vascular: Cerebrovascular atherosclerotic calcifications are noted. Skull: Negative for fracture or focal lesion. Sinuses/Orbits: Visualized portions of the orbits are unremarkable. Visualized portions of the paranasal sinuses and mastoid air cells are unremarkable. Other: None. CT CERVICAL SPINE FINDINGS Alignment: Normal. Skull base and vertebrae: No acute fracture. No primary bone lesion or focal pathologic process. Soft tissues and spinal canal: No prevertebral fluid or swelling. No visible canal hematoma. Disc levels: Minimal degenerative disc disease disc height loss at C5-6 and T1-2. At C3-4 there is a shallow central disc protrusion. At C4-5 there is a moderate-sized central disc protrusion abutting the ventral cervical spinal cord. At C5-6 there is a broad-based disc osteophyte complex. At C6-7 there is a mild broad-based disc bulge. Upper chest: Lung apices are clear. Other: No fluid collection or hematoma. IMPRESSION: 1. No acute intracranial pathology. 2. No acute osseous injury the cervical spine. 3. Cervical spine spondylosis as described above. At C4-5 there is a moderate-sized  central disc protrusion abutting the ventral cervical spinal cord. Electronically Signed   By: Kathreen Devoid   On: 10/01/2016 12:00   Ct Cervical Spine Wo Contrast  Result Date: 10/01/2016 CLINICAL DATA:  Pt brought in by [facility] from home. Pt has hx.o liver cancer. Per EMS pt fell this morning. Pt does not remember falling. Pt family at bedside and states that pt has had increased confusion. Confusion has been an ongoing issue.  EXAM: CT HEAD WITHOUT CONTRAST CT CERVICAL SPINE WITHOUT CONTRAST TECHNIQUE: Multidetector CT imaging of the head and cervical spine was performed following the standard protocol without intravenous contrast. Multiplanar CT image reconstructions of the cervical spine were also generated. COMPARISON:  None. FINDINGS: CT HEAD FINDINGS Brain: No evidence of acute infarction, hemorrhage, extra-axial collection, ventriculomegaly, or mass effect. Generalized cerebral atrophy. Periventricular white matter low attenuation likely secondary to microangiopathy. Vascular: Cerebrovascular atherosclerotic calcifications are noted. Skull: Negative for fracture or focal lesion. Sinuses/Orbits: Visualized portions of the orbits are unremarkable. Visualized portions of the paranasal sinuses and mastoid air cells are unremarkable. Other: None. CT CERVICAL SPINE FINDINGS Alignment: Normal. Skull base and vertebrae: No acute fracture. No primary bone lesion or focal pathologic process. Soft tissues and spinal canal: No prevertebral fluid or swelling. No visible canal hematoma. Disc levels: Minimal degenerative disc disease disc height loss at C5-6 and T1-2. At C3-4 there is a shallow central disc protrusion. At C4-5 there is a moderate-sized central disc protrusion abutting the ventral cervical spinal cord. At C5-6 there is a broad-based disc osteophyte complex. At C6-7 there is a mild broad-based disc bulge. Upper chest: Lung apices are clear. Other: No fluid collection or hematoma. IMPRESSION: 1. No acute  intracranial pathology. 2. No acute osseous injury the cervical spine. 3. Cervical spine spondylosis as described above. At C4-5 there is a moderate-sized central disc protrusion abutting the ventral cervical spinal cord. Electronically Signed   By: Kathreen Devoid   On: 10/01/2016 12:00   Dg Chest Portable 1 View  Result Date: 10/01/2016 CLINICAL DATA:  History of liver carcinoma.  Fall earlier today EXAM: PORTABLE CHEST 1 VIEW COMPARISON:  June 14, 2016 FINDINGS: There is airspace consolidation in the medial left base. Lungs elsewhere are clear. Heart size and pulmonary vascularity are normal. No adenopathy. No bone lesions. There is postoperative change in the proximal right humerus. IMPRESSION: Consolidation medial left base concerning for pneumonia. Based on appearance on prior chest radiograph, there may also be chronic atelectatic change in this area. Lungs elsewhere clear. No adenopathy. Stable cardiac silhouette. Electronically Signed   By: Lowella Grip III M.D.   On: 10/01/2016 13:21     Management plans discussed with the patient, family and they are in agreement.  CODE STATUS:     Code Status Orders        Start     Ordered   10/02/16 1409  Do not attempt resuscitation (DNR)  Continuous    Question Answer Comment  In the event of cardiac or respiratory ARREST Do not call a "code blue"   In the event of cardiac or respiratory ARREST Do not perform Intubation, CPR, defibrillation or ACLS   In the event of cardiac or respiratory ARREST Use medication by any route, position, wound care, and other measures to relive pain and suffering. May use oxygen, suction and manual treatment of airway obstruction as needed for comfort.      10/02/16 1410    Code Status History    Date Active Date Inactive Code Status Order ID Comments User Context   10/01/2016  3:02 PM 10/02/2016  2:10 PM DNR [id number]  Idelle Crouch, MD Inpatient    Advance Directive Documentation     Most Recent  Value  Type of Advance Directive  Healthcare Power of Attorney, Living will, Out of facility DNR (pink MOST or yellow form)  Pre-existing out of facility DNR order (yellow form or pink MOST form)  Yellow form  placed in chart (order not valid for inpatient use)  "MOST" Form in Place?  -      TOTAL TIME TAKING CARE OF THIS PATIENT: *40* minutes.    Lebaron Bautch M.D on 10/03/2016 at 8:29 AM  Between 7am to 6pm - Pager - [phone] After 6pm go to [email or web address] - password [email or web address] [facility]  Office  [phone]  CC: Primary care physician; Crecencio Mc, MD

## 2016-10-03 NOTE — Progress Notes (Signed)
Daily Progress Note   Patient Name: Jennifer Ruiz       Date: 10/03/2016 DOB: [birth year]  Age: 71 y.o. MRN#: [id number] Attending Physician: Fritzi Mandes, MD Primary Care Physician: Crecencio Mc, MD Admit Date: 10/01/2016  Reason for Consultation/Follow-up: Terminal Care  Subjective: Patient resting comfortably in bed. Family at bedside- life review in progress. No needs. Plan for transport to [facility] soon. Review of Systems  Unable to perform ROS: Acuity of condition    Length of Stay: 2  Current Medications: Scheduled Meds:  . docusate sodium  100 mg Oral BID  . pantoprazole (PROTONIX) IV  40 mg Intravenous Q12H  . sodium chloride flush  3 mL Intravenous Q12H    Continuous Infusions: . sodium chloride      PRN Meds: sodium chloride, acetaminophen **OR** acetaminophen, bisacodyl, glycopyrrolate **OR** glycopyrrolate **OR** glycopyrrolate, haloperidol **OR** haloperidol **OR** haloperidol lactate, ipratropium-albuterol, LORazepam **OR** LORazepam **OR** LORazepam, morphine injection, ondansetron **OR** ondansetron (ZOFRAN) IV, oxyCODONE **OR** oxyCODONE, sodium chloride flush  Physical Exam  Constitutional:  Frail, not distressed  Cardiovascular:  Distant sounds  Pulmonary/Chest: Effort normal.  Neurological:  Awake, lethargic, oriented to person and place  Skin:  jaundice  Psychiatric: She has a normal mood and affect. Her behavior is normal. Judgment and thought content normal.            Vital Signs: BP (!) 107/39   Pulse 84   Temp 97.6 F (36.4 C) (Oral)   Resp 20   Ht 5\' 4"  (1.626 m)   Wt 90.8 kg (200 lb 2.8 oz)   SpO2 99%   BMI 34.36 kg/m  SpO2: SpO2: 99 % O2 Device: O2 Device: Not Delivered O2 Flow Rate:    Intake/output summary:  Intake/Output  Summary (Last 24 hours) at 10/03/16 1124 Last data filed at 10/02/16 1700  Gross per 24 hour  Intake              303 ml  Output                0 ml  Net              303 ml   LBM: Last BM Date:  (unknown) Baseline Weight: Weight: 88.7 kg (195 lb 8.8 oz) Most recent weight: Weight: 90.8 kg (200 lb 2.8  oz)       Palliative Assessment/Data: PPS: 10%    Flowsheet Rows     Most Recent Value  Intake Tab  Referral Department  Hospitalist  Unit at Time of Referral  ICU  Palliative Care Primary Diagnosis  Sepsis/Infectious Disease  Date Notified  10/01/16  Palliative Care Type  New Palliative care  Reason for referral  Clarify Goals of Care  Date of Admission  10/01/16  Date first seen by Palliative Care  10/02/16  # of days Palliative referral response time  1 Day(s)  # of days IP prior to Palliative referral  0  Clinical Assessment  Psychosocial & Spiritual Assessment  Palliative Care Outcomes      Patient Active Problem List   Diagnosis Date Noted  . Terminal care   . Palliative care by specialist   . Sepsis ([place]) 10/01/2016  . CAP (community acquired pneumonia) 10/01/2016  . Hypothermia 10/01/2016  . AKI (acute kidney injury) ([place])   . Hepatic encephalopathy ([place])   . Fatigue 07/26/2016  . Pleural effusion on left 06/14/2016  . 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 Carcinoma of unknown primary ([place]) 12/31/2015  . Liver mass 12/22/2015  . Cirrhosis of liver with ascites ([place]) 12/12/2015  . Edema extremities 12/03/2015  . Abdominal distension 12/01/2015  . Venous insufficiency of both lower extremities 10/08/2015  . Mammographic microcalcification 03/12/2013  . Goals of care, counseling/discussion 08/07/2012  . Personal history of colonic polyps 04/03/2012  . White coat hypertension 04/03/2012  . Diabetes mellitus type 2 in obese ([place]) 04/02/2012  . Obesity (BMI 30-39.9) 04/02/2012    Palliative Care Assessment & Plan   Patient Profile: 71 y.o.  female  with past medical history of HTN, DM, cholangiocarcinoma (Stage IV with refractory ascites, hepatic encephalopathy, no further treatment planned- was to see Hospice outpatient), CKD (stage III)  admitted on 10/01/2016 from home due to a fall. Workup so far has revealed pneumonia on chest xray. She is in hepatorenal failure, hypotensive- requiring IV fluid resuscitation, prognosis is poor. Palliative medicine consulted for [place].    Assessment/Recommendations/Plan   D/C to Hospice Home  Continue comfort measures  Goals of Care and Additional Recommendations:  Limitations on Scope of Treatment: Full Comfort Care  Code Status:  DNR  Prognosis:   Hours - Days  Discharge Planning:  Hospice facility  Care plan was discussed with patient's family.  Thank you for allowing the Palliative Medicine Team to assist in the care of this patient.   Time In: 0900 Time Out: 0915 Total Time 15 mins Prolonged Time Billed No      Greater than 50%  of this time was spent counseling and coordinating care related to the above assessment and plan.  Mariana Kaufman, AGNP-C Palliative Medicine   Please contact Palliative Medicine Team phone at [phone] for questions and concerns.

## 2016-10-03 NOTE — Progress Notes (Signed)
EMS at bedside to transport patient to [place]. Family at bedside aware of patient status. Report called by Flo Shanks RN to Hospice.

## 2016-10-03 NOTE — Clinical Social Work Note (Signed)
Patient to be d/c'ed today to [facility].  Patient and family agreeable to plans will transport via ems RN liason to call report.  Evette Cristal, MSW, [place]

## 2016-10-04 LAB — LEGIONELLA PNEUMOPHILA SEROGP 1 UR AG: L. PNEUMOPHILA SEROGP 1 UR AG: NEGATIVE

## 2016-10-06 LAB — CULTURE, BLOOD (ROUTINE X 2)
CULTURE: NO GROWTH
Culture: NO GROWTH
Specimen Description: ADEQUATE
Specimen Description: ADEQUATE

## 2016-10-13 ENCOUNTER — Ambulatory Visit: Payer: Self-pay | Admitting: Internal Medicine

## 2016-10-13 ENCOUNTER — Other Ambulatory Visit: Payer: Self-pay

## 2016-10-23 ENCOUNTER — Telehealth: Payer: Self-pay | Admitting: *Deleted

## 2016-10-23 NOTE — Telephone Encounter (Signed)
FYI Patient passed 10/04/2016 @0210 

## 2016-10-23 NOTE — Telephone Encounter (Signed)
Do we have next of kin that we can send a sympathy card to?

## 2016-10-23 NOTE — Telephone Encounter (Signed)
FYI

## 2016-10-24 NOTE — Telephone Encounter (Signed)
Card placed in quick sign folder.

## 2016-11-03 DEATH — deceased

## 2016-12-09 ENCOUNTER — Other Ambulatory Visit: Payer: Self-pay | Admitting: Nurse Practitioner

## 2017-02-28 NOTE — Telephone Encounter (Signed)
Error

## 2017-11-03 DEATH — deceased

## 2018-04-13 IMAGING — CT CT HEAD W/O CM
4 of 7 series · 15 of 47 positions shown, 16 images · non-contrast
Comparison: None.

CLINICAL DATA: Pt brought in by [facility] from home. Pt has hx.o liver
cancer. Per EMS pt fell this morning. Pt does not remember falling.
Pt family at bedside and states that pt has had increased confusion.
Confusion has been an ongoing issue.

EXAM:
CT HEAD WITHOUT CONTRAST
CT CERVICAL SPINE WITHOUT CONTRAST
TECHNIQUE: Multidetector CT imaging of the head and cervical spine was
performed following the standard protocol without intravenous
contrast. Multiplanar CT image reconstructions of the cervical spine
were also generated.

[Series 4: head wo · axial · 0.40mm/px · z∈[-51,+19]mm · 3 of 29 slices shown, 4 images]
[im 8/29  brain]
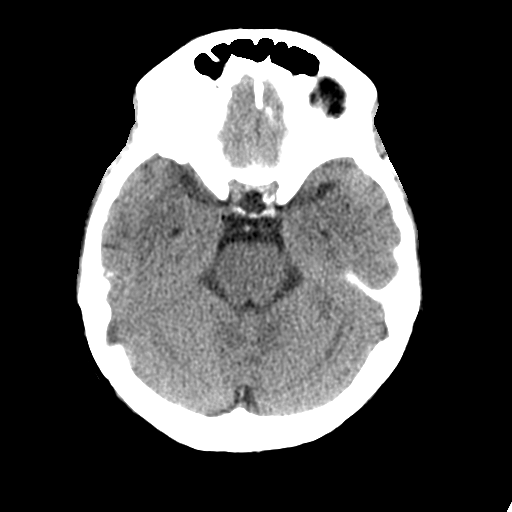
[im 8/29  bone]
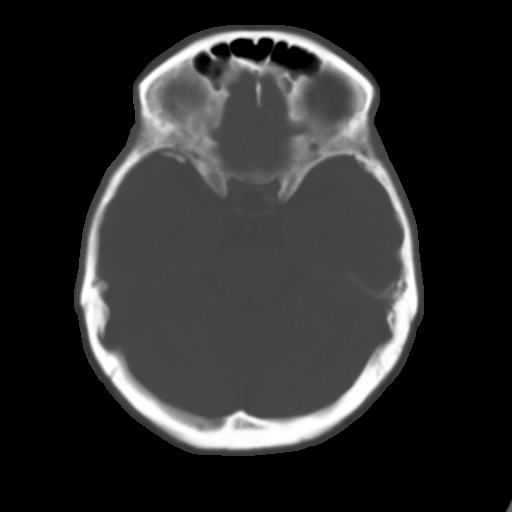
[im 15/29  brain]
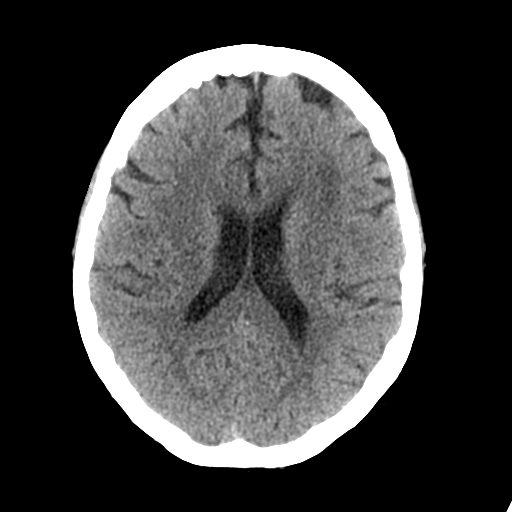
[im 22/29  brain]
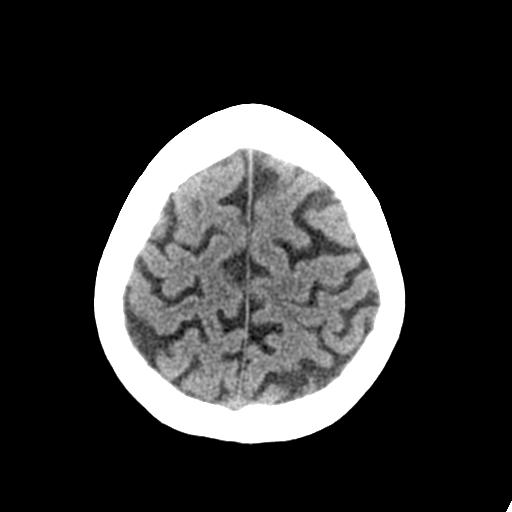

[Series 6: sagittal soft tissue · sagittal · 0.28mm/px · 1 of 51 slices shown]
[im 26/51  brain]
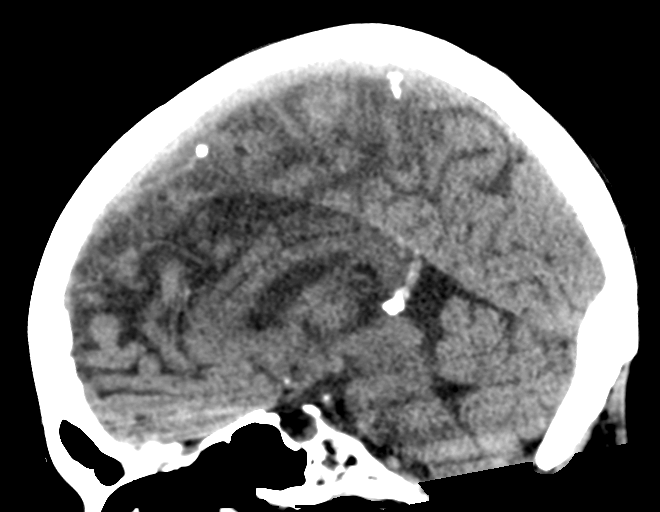

[Series 12: coronal bone · coronal · 0.23mm/px · 3 of 61 slices shown]
[im 18/61  brain]
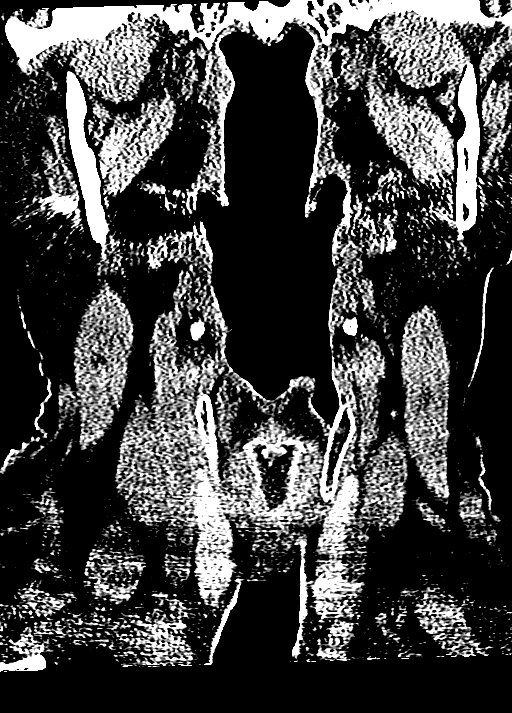
[im 26/61  brain]
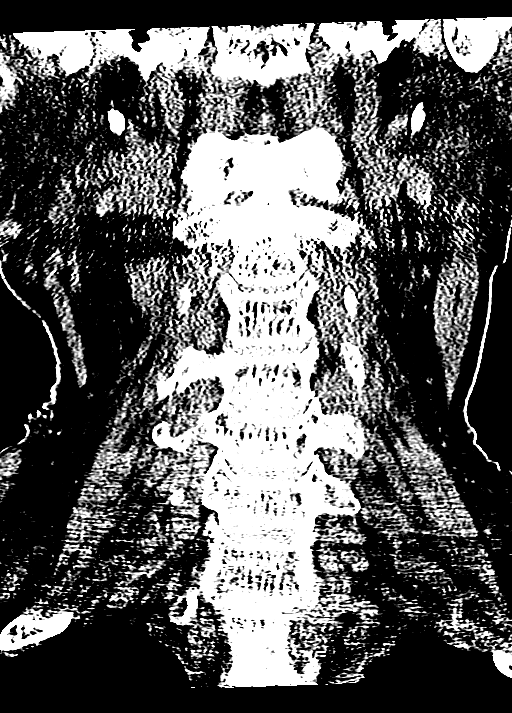
[im 35/61  brain]
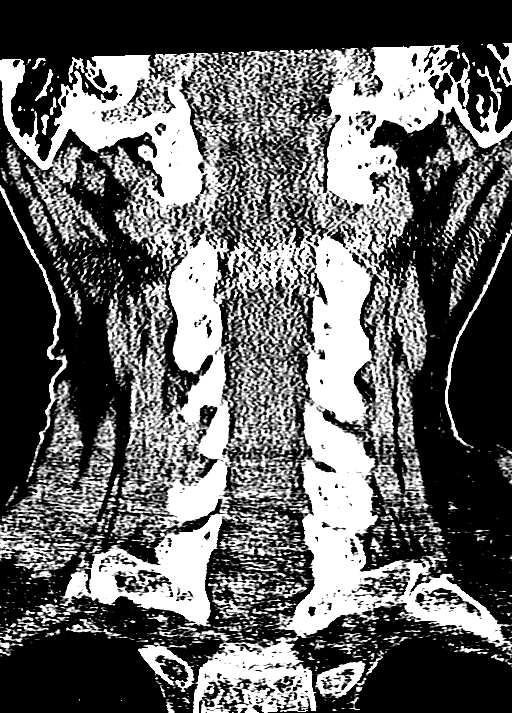

[Series 13: orthogonal bone · axial · 0.23mm/px · z∈[-233,-108]mm · 8 of 80 slices shown]
[im 7/80  bone]
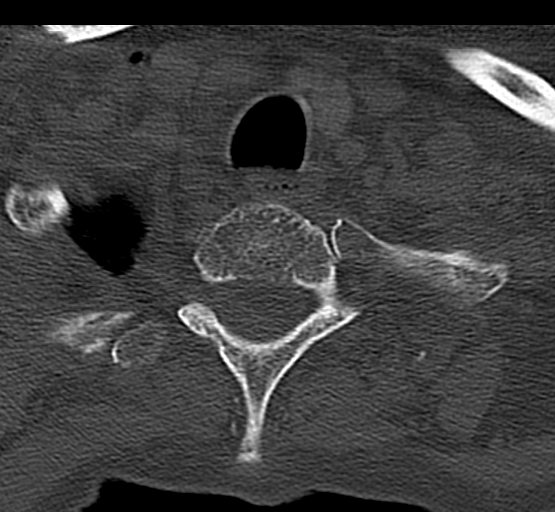
[im 20/80  bone]
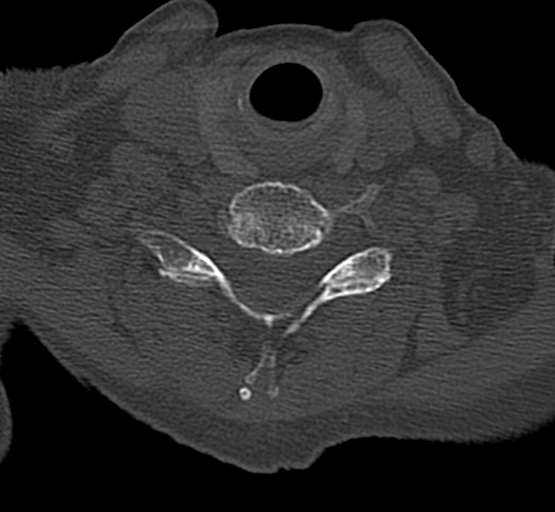
[im 27/80  bone]
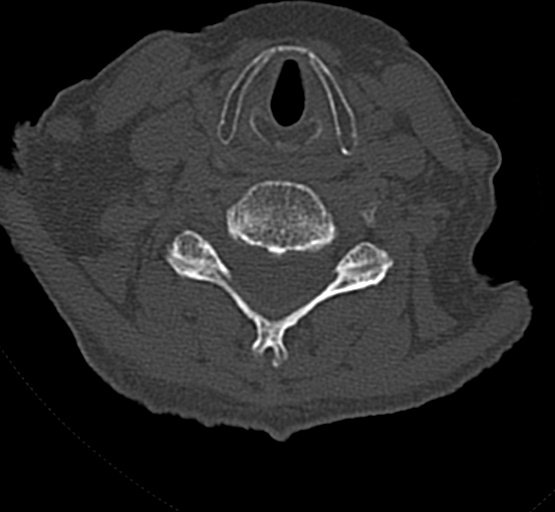
[im 33/80  bone]
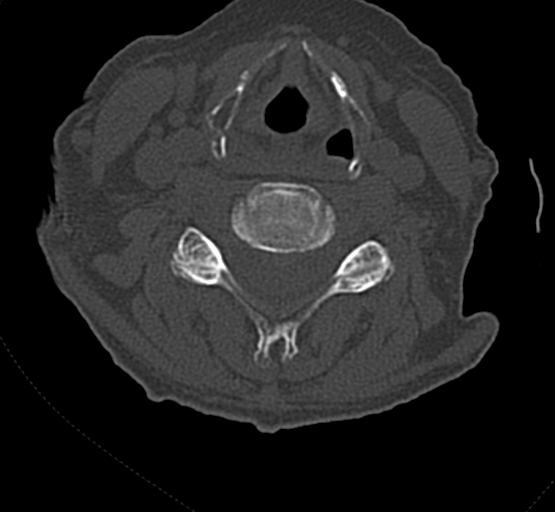
[im 47/80  bone]
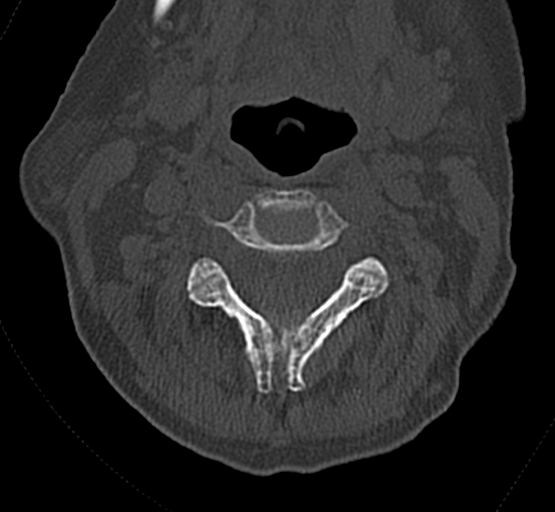
[im 53/80  bone]
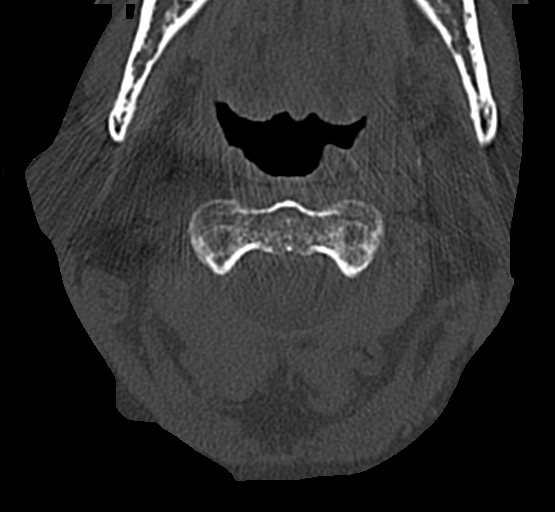
[im 60/80  bone]
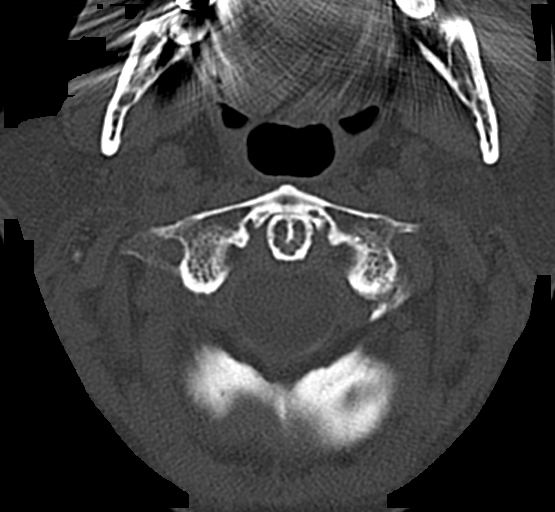
[im 73/80  bone]
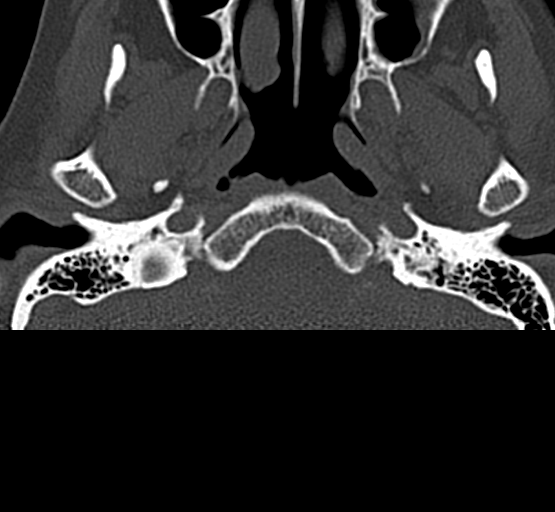

[15 of 47 positions shown; findings below may reference images not displayed]

FINDINGS: CT HEAD FINDINGS

Brain: No evidence of acute infarction, hemorrhage, extra-axial
collection, ventriculomegaly, or mass effect. Generalized cerebral
atrophy. Periventricular white matter low attenuation likely
secondary to microangiopathy.

Vascular: Cerebrovascular atherosclerotic calcifications are noted.

Skull: Negative for fracture or focal lesion.

Sinuses/Orbits: Visualized portions of the orbits are unremarkable.
Visualized portions of the paranasal sinuses and mastoid air cells
are unremarkable.

Other: None.

CT CERVICAL SPINE FINDINGS

Alignment: Normal.

Skull base and vertebrae: No acute fracture. No primary bone lesion
or focal pathologic process.

Soft tissues and spinal canal: No prevertebral fluid or swelling. No
visible canal hematoma.

Disc levels: Minimal degenerative disc disease disc height loss at
C5-6 and T1-2. At C3-4 there is a shallow central disc protrusion.
At C4-5 there is a moderate-sized central disc protrusion abutting
the ventral cervical spinal cord. At C5-6 there is a broad-based
disc osteophyte complex. At C6-7 there is a mild broad-based disc
bulge.

Upper chest: Lung apices are clear.

Other: No fluid collection or hematoma.
IMPRESSION: 1. No acute intracranial pathology.
2. No acute osseous injury the cervical spine.
3. Cervical spine spondylosis as described above. At C4-5 there is a
moderate-sized central disc protrusion abutting the ventral cervical
spinal cord.
